# Patient Record
Sex: Female | Born: 1980
Health system: Southern US, Community
[De-identification: ages and names within clinical notes are randomized; demographics above are authoritative.]

## PROBLEM LIST (undated history)

## (undated) DIAGNOSIS — E669 Obesity, unspecified: Secondary | ICD-10-CM

## (undated) DIAGNOSIS — Z87442 Personal history of urinary calculi: Secondary | ICD-10-CM

## (undated) DIAGNOSIS — F410 Panic disorder [episodic paroxysmal anxiety] without agoraphobia: Secondary | ICD-10-CM

## (undated) DIAGNOSIS — K2970 Gastritis, unspecified, without bleeding: Secondary | ICD-10-CM

## (undated) DIAGNOSIS — E785 Hyperlipidemia, unspecified: Secondary | ICD-10-CM

## (undated) DIAGNOSIS — J45909 Unspecified asthma, uncomplicated: Secondary | ICD-10-CM

## (undated) DIAGNOSIS — G43909 Migraine, unspecified, not intractable, without status migrainosus: Secondary | ICD-10-CM

## (undated) DIAGNOSIS — M1990 Unspecified osteoarthritis, unspecified site: Secondary | ICD-10-CM

## (undated) DIAGNOSIS — O039 Complete or unspecified spontaneous abortion without complication: Secondary | ICD-10-CM

## (undated) DIAGNOSIS — F419 Anxiety disorder, unspecified: Secondary | ICD-10-CM

## (undated) DIAGNOSIS — T7840XA Allergy, unspecified, initial encounter: Secondary | ICD-10-CM

## (undated) DIAGNOSIS — E559 Vitamin D deficiency, unspecified: Secondary | ICD-10-CM

## (undated) DIAGNOSIS — R87619 Unspecified abnormal cytological findings in specimens from cervix uteri: Secondary | ICD-10-CM

## (undated) DIAGNOSIS — N200 Calculus of kidney: Secondary | ICD-10-CM

## (undated) HISTORY — DX: Panic disorder (episodic paroxysmal anxiety): F41.0

## (undated) HISTORY — DX: Obesity, unspecified: E66.9

## (undated) HISTORY — DX: Unspecified asthma, uncomplicated: J45.909

## (undated) HISTORY — DX: Unspecified abnormal cytological findings in specimens from cervix uteri: R87.619

## (undated) HISTORY — DX: Allergy, unspecified, initial encounter: T78.40XA

## (undated) HISTORY — DX: Hyperlipidemia, unspecified: E78.5

## (undated) HISTORY — DX: Anxiety disorder, unspecified: F41.9

## (undated) HISTORY — DX: Gastritis, unspecified, without bleeding: K29.70

## (undated) HISTORY — DX: Migraine, unspecified, not intractable, without status migrainosus: G43.909

## (undated) HISTORY — DX: Vitamin D deficiency, unspecified: E55.9

## (undated) HISTORY — DX: Complete or unspecified spontaneous abortion without complication: O03.9

---

## 1999-03-21 DIAGNOSIS — IMO0001 Reserved for inherently not codable concepts without codable children: Secondary | ICD-10-CM

## 1999-03-21 DIAGNOSIS — O039 Complete or unspecified spontaneous abortion without complication: Secondary | ICD-10-CM

## 1999-03-21 HISTORY — DX: Complete or unspecified spontaneous abortion without complication: O03.9

## 1999-03-21 HISTORY — DX: Reserved for inherently not codable concepts without codable children: IMO0001

## 2001-02-04 HISTORY — PX: BREAST BIOPSY: SHX20

## 2003-03-21 DIAGNOSIS — K2970 Gastritis, unspecified, without bleeding: Secondary | ICD-10-CM

## 2003-03-21 HISTORY — DX: Gastritis, unspecified, without bleeding: K29.70

## 2010-03-20 DIAGNOSIS — R87619 Unspecified abnormal cytological findings in specimens from cervix uteri: Secondary | ICD-10-CM

## 2010-03-20 HISTORY — DX: Unspecified abnormal cytological findings in specimens from cervix uteri: R87.619

## 2010-03-20 HISTORY — PX: COLPOSCOPY: SHX161

## 2012-04-08 DIAGNOSIS — Z9884 Bariatric surgery status: Secondary | ICD-10-CM | POA: Insufficient documentation

## 2012-04-08 HISTORY — PX: CHOLECYSTECTOMY: SHX55

## 2012-04-08 HISTORY — PX: OTHER SURGICAL HISTORY: SHX169

## 2013-05-09 DIAGNOSIS — Z9109 Other allergy status, other than to drugs and biological substances: Secondary | ICD-10-CM | POA: Insufficient documentation

## 2013-05-09 DIAGNOSIS — J302 Other seasonal allergic rhinitis: Secondary | ICD-10-CM | POA: Insufficient documentation

## 2013-06-26 LAB — HM PAP SMEAR: HM Pap smear: NORMAL

## 2014-04-13 ENCOUNTER — Telehealth: Payer: Self-pay

## 2014-04-13 NOTE — Telephone Encounter (Signed)
Can we request notes from Dr. Delana Meyer first?

## 2014-04-13 NOTE — Telephone Encounter (Signed)
Sure

## 2014-04-13 NOTE — Telephone Encounter (Signed)
The patient called and is hoping to be a new patient. She stated Dr.Schnier (spelling?) referred her to you.

## 2014-04-21 NOTE — Telephone Encounter (Signed)
Nevermind on this request - Lorriane Shire has scheduled this pt with Dr.Tullo.  (sorry for the confusion)

## 2014-04-28 ENCOUNTER — Encounter: Payer: Self-pay | Admitting: Internal Medicine

## 2014-04-28 ENCOUNTER — Ambulatory Visit (INDEPENDENT_AMBULATORY_CARE_PROVIDER_SITE_OTHER): Payer: 59 | Admitting: Internal Medicine

## 2014-04-28 VITALS — BP 130/82 | HR 75 | Resp 12 | Ht 66.0 in | Wt 231.1 lb

## 2014-04-28 DIAGNOSIS — E669 Obesity, unspecified: Secondary | ICD-10-CM

## 2014-04-28 DIAGNOSIS — Z0000 Encounter for general adult medical examination without abnormal findings: Secondary | ICD-10-CM

## 2014-04-28 DIAGNOSIS — E559 Vitamin D deficiency, unspecified: Secondary | ICD-10-CM

## 2014-04-28 DIAGNOSIS — Z8669 Personal history of other diseases of the nervous system and sense organs: Secondary | ICD-10-CM

## 2014-04-28 DIAGNOSIS — R739 Hyperglycemia, unspecified: Secondary | ICD-10-CM

## 2014-04-28 DIAGNOSIS — E785 Hyperlipidemia, unspecified: Secondary | ICD-10-CM

## 2014-04-28 DIAGNOSIS — F411 Generalized anxiety disorder: Secondary | ICD-10-CM | POA: Insufficient documentation

## 2014-04-28 MED ORDER — CLONAZEPAM 2 MG PO TABS: 2.0000 mg | ORAL_TABLET | Freq: Two times a day (BID) | ORAL | Status: DC | PRN
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
## 2014-04-28 NOTE — Assessment & Plan Note (Addendum)
Managed with once daily clonazepam and prn alprazolam,  Rare use.. Prior trials of SSRI, SNRIs  were not tolerated and ineffective despite dose titration.  Refills given.  The risks and benefits of benzodiazepine use were discussed with patient today including excessive sedation leading to respiratory depression,  impaired thinking/driving, and addiction.  Patient was advised to avoid concurrent use with alcohol, to use medication only as needed and not to share with others  .

## 2014-04-28 NOTE — Patient Instructions (Signed)
It was very nice meeting you! Return tomorrow for fasting labs  All of the foods can be found at grocery stores and in bulk at [employer].  The Atkins protein bars and shakes are available in more varieties at Target, [employer] and [employer].     7 AM Breakfast:  Choose from the following:  < 5 carbs  Weekdays: Low carbohydrate Protein  Shakes (EAS AdvantEdge "Carb  Control" shakes, Atkins,  Muscle Milk or Premier Protein shakes)     Weekends:  a scrambled egg/bacon/cheese burrito made with Mission's "carb  balance" whole wheat tortilla  (about 10 net carbs )  Eggs,  bacon /sausage , Joseph's pita /lavash bread or  (5 carbs)  A slice of fritatta ( egg based baked dish, no  crust:  google it) (< 10 carbs)   Avoid cereal and bananas, oatmeal and cream of wheat and grits. They are loaded with carbohydrates!   10 AM: high protein snack  (< 5 carbs)   Protein bar by Atkins  Or KIND  (the snack size, < 200 cal, usually < 6 carbs    A stick of cheese:  Around 1 carb,  100 cal      Other so called "protein bars" tend to be loaded with carbohydrates.  Remember, in food advertising, the word "energy" is synonymous for " carbohydrate."  Lunch:   A Sandwich using the bread choices listed, Can use any  Eggs,  lunchmeat, grilled meat or canned tuna).  Can add avocado, regular mayo/mustard  and cheese.  A Salad using blue cheese, ranch,  Goddess dressing  or vinagrette,  No croutons or "confetti" and no "candied nuts" but regular nuts OK.   2 HARD BOILED EGG WHITES AND A CUP OF one of these greek yogurts:    dannon lt n fit greek yogurt         chobani 100 greek yogurt,    Oikos triple zero greek yogurt       No pretzels or chips.  Pickles and miniature sweet peppers are a good low  carb alternative that provide a "crunch"  The bread is the only source of carbohydrate in a sandwich and  can be decreased by trying some of these alternatives to traditional loaf bread:   Joseph's pita bread and Lavash  (flat) bread :  50 cal and 4 net carbs  available at BJs and WalMart.  Taste better when toasted, use as pita chips  Toufayan makes a variety of  flatbreads and  A PITA POCKET.    LOOK FOR  THE ONES THAT ARE 17 NET CARBS OR LESS    Mission makes 2 sizes of  Low carb whole wheat tortillas  (The large one is  210 cal and 6 net carbs)   Avoid "Low fat dressings, as well as Barry Brunner and [place] dressings    3 PM/ Mid day  Snack:  Consider  1 ounce of  almonds, walnuts, pistachios, pecans, peanuts,  Macadamia nuts or a nut medley that does not contain raisins or cranberries.  No "granola"; the dried cranberries and raisins are loaded with carbohydrates. Mixed nuts as long as there are no raisins,  cranberries or dried fruit.    Try the prosciutto/mozzarella cheese sticks by F[employer]ruci  In deli /backery section   High protein   To avoid overindulging in snacks: Try drinking a glass of unsweeted almond/coconut milk  Or a cup of coffee with your Atkins chocolate bar to keep  you from having 3!!!   Pork rinds!  Yes Pork Rinds are low carb potato chip substitute!   Toasted Joseph's flatbread with hummous dip (chickpeas)       6 PM  Dinner:     Meat/fowl/fish with a green salad, and either broccoli, cauliflower, green beans, spinach, brussel sprouts, bok choy or  Lima beans. Fried in canola oil /olive oil BUT DO NOT BREAD THE PROTEIN!!      There is a low carb pasta by Dreamfield's that is acceptable and tastes great: only 5 digestible carbs/serving.( All grocery stores but BJs carry it )  Prepared Meals:  Try Hurley Cisco Angelo's chicken piccata or chicken or eggplant parm over low carb pasta.(Lowes and BJs)   Marjory Lies Sanchez's "Carnitas" (pulled pork, no sauce,  0 carbs) or his beef pot roast to make a dinner burrito (at [employer])  Barbecue with cole slaw is low carb BUT NO BUN!  SAME WITH HAMBURGERS     Whole wheat pasta is still full of digestible carbs and  Not as low in glycemic index as Dreamfield's.    Brown rice is still rice,  So skip the rice and noodles if you eat [place] or [place] (or at least limit to 1/2 cup)  9 PM snack :   Breyer's "low carb" fudgsicle or  ice cream bar (Carb Smart line), or  Weight  Watcher's ice cream bar , or another "no sugar added" ice cream;  a serving of fresh berries/cherries with whipped cream   Cheese or greek yogurt   8 ounces of [place] unsweetened almond/cococunut milk  Cheese and crackers (using WASA crackers,  They are low carb) or peanut butter on low carb crackers or pita bread     Avoid bananas, pineapple, grapes  and watermelon on a regular basis because they are high in sugar.  THINK OF THEM AS DESSERT and do not have daily   Remember that snack Substitutions should be less than 10 NET carbs per serving and meals should be < 20 net carbs. Remember that carbohydrates from fiber do not affect blood sugar, so you can  subtract fiber grams to get the "net carbs " of any particular food item.

## 2014-04-29 ENCOUNTER — Encounter: Payer: Self-pay | Admitting: Internal Medicine

## 2014-04-29 ENCOUNTER — Other Ambulatory Visit (INDEPENDENT_AMBULATORY_CARE_PROVIDER_SITE_OTHER): Payer: 59

## 2014-04-29 DIAGNOSIS — Z8669 Personal history of other diseases of the nervous system and sense organs: Secondary | ICD-10-CM | POA: Insufficient documentation

## 2014-04-29 DIAGNOSIS — R739 Hyperglycemia, unspecified: Secondary | ICD-10-CM

## 2014-04-29 DIAGNOSIS — E559 Vitamin D deficiency, unspecified: Secondary | ICD-10-CM

## 2014-04-29 DIAGNOSIS — Z0001 Encounter for general adult medical examination with abnormal findings: Secondary | ICD-10-CM | POA: Insufficient documentation

## 2014-04-29 DIAGNOSIS — Z0000 Encounter for general adult medical examination without abnormal findings: Secondary | ICD-10-CM | POA: Insufficient documentation

## 2014-04-29 DIAGNOSIS — E669 Obesity, unspecified: Secondary | ICD-10-CM | POA: Insufficient documentation

## 2014-04-29 DIAGNOSIS — E785 Hyperlipidemia, unspecified: Secondary | ICD-10-CM

## 2014-04-29 LAB — COMPREHENSIVE METABOLIC PANEL
ALT: 16 U/L (ref 0–35)
AST: 15 U/L (ref 0–37)
Albumin: 4 g/dL (ref 3.5–5.2)
Alkaline Phosphatase: 60 U/L (ref 39–117)
BUN: 11 mg/dL (ref 6–23)
CO2: 26 mEq/L (ref 19–32)
Calcium: 9.2 mg/dL (ref 8.4–10.5)
Chloride: 109 mEq/L (ref 96–112)
Creatinine, Ser: 0.59 mg/dL (ref 0.40–1.20)
GFR: 123.12 mL/min (ref >60.00)
Glucose, Bld: 87 mg/dL (ref 70–99)
Potassium: 4.3 mEq/L (ref 3.5–5.1)
Sodium: 142 mEq/L (ref 135–145)
Total Bilirubin: 0.6 mg/dL (ref 0.2–1.2)
Total Protein: 6.5 g/dL (ref 6.0–8.3)

## 2014-04-29 LAB — LIPID PANEL
Cholesterol: 217 mg/dL — ABNORMAL HIGH (ref 0–200)
HDL: 48.8 mg/dL (ref >39.00)
LDL Cholesterol: 139 mg/dL — ABNORMAL HIGH (ref 0–99)
NonHDL: 168.2
Total CHOL/HDL Ratio: 4
Triglycerides: 144 mg/dL (ref 0.0–149.0)
VLDL: 28.8 mg/dL (ref 0.0–40.0)

## 2014-04-29 LAB — TSH: TSH: 1.14 u[IU]/mL (ref 0.35–4.50)

## 2014-04-29 LAB — MAGNESIUM: Magnesium: 1.9 mg/dL (ref 1.5–2.5)

## 2014-04-29 LAB — HEMOGLOBIN A1C: Hgb A1c MFr Bld: 5.4 % (ref 4.6–6.5)

## 2014-04-29 LAB — VITAMIN D 25 HYDROXY (VIT D DEFICIENCY, FRACTURES): VITD: 21.62 ng/mL — ABNORMAL LOW (ref 30.00–100.00)

## 2014-04-29 MED ORDER — CLINDAMYCIN PHOS-BENZOYL PEROX 1.2-5 % EX GEL: 1.0000 "application " | Freq: Every day | CUTANEOUS | Status: DC

## 2014-04-29 NOTE — Assessment & Plan Note (Signed)
Infrequent,  Stress related,  Managed with imitrex,  Daily Toprol

## 2014-04-29 NOTE — Assessment & Plan Note (Addendum)
S/p gastric sleeve reduction Jan 2014 done in [place].  70 wt loss thus far.  Has plateaued.   I have addressed  BMI and recommended resuming a low glycemic index diet utilizing smaller more frequent meals to increase metabolism.  I have also recommended that patient resume a regular exercise program with  a goal of 30 minutes of aerobic exercise a minimum of 5 days per week. Screening for lipid disorders, thyroid and diabetes to be done today.  Body mass index is 37.32 kg/(m^2).

## 2014-04-29 NOTE — Assessment & Plan Note (Signed)

## 2014-05-01 ENCOUNTER — Other Ambulatory Visit: Payer: Self-pay | Admitting: Internal Medicine

## 2014-05-01 ENCOUNTER — Encounter: Payer: Self-pay | Admitting: Internal Medicine

## 2014-05-01 DIAGNOSIS — E559 Vitamin D deficiency, unspecified: Secondary | ICD-10-CM | POA: Insufficient documentation

## 2014-05-01 MED ORDER — VITAMIN D (ERGOCALCIFEROL) 1.25 MG (50000 UNIT) PO CAPS
50000.0000 [IU] | ORAL_CAPSULE | ORAL | Status: DC
Start: 2014-05-01 — End: 2015-07-14

## 2014-05-17 ENCOUNTER — Encounter: Payer: Self-pay | Admitting: Internal Medicine

## 2014-05-18 MED ORDER — DESLORATADINE 5 MG PO TABS: 5.0000 mg | ORAL_TABLET | Freq: Every day | ORAL | Status: DC

## 2014-05-18 MED ORDER — AZELASTINE-FLUTICASONE 137-50 MCG/ACT NA SUSP: 1.0000 | Freq: Two times a day (BID) | NASAL | Status: DC | PRN

## 2014-05-31 ENCOUNTER — Encounter: Payer: Self-pay | Admitting: Internal Medicine

## 2014-06-01 MED ORDER — MONTELUKAST SODIUM 10 MG PO TABS: 10.0000 mg | ORAL_TABLET | Freq: Every day | ORAL | Status: DC

## 2014-06-01 MED ORDER — METOPROLOL SUCCINATE ER 100 MG PO TB24: 100.0000 mg | ORAL_TABLET | Freq: Every day | ORAL | Status: DC

## 2014-06-01 NOTE — Telephone Encounter (Signed)
Pt requesting 90 day supplies, see mychart msg. Metoprolol and Montekulast already sent.

## 2014-06-02 MED ORDER — LIDOCAINE-PRILOCAINE 2.5-2.5 % EX CREA
1.0000 | TOPICAL_CREAM | CUTANEOUS | Status: DC | PRN
Start: 2014-06-02 — End: 2016-03-02

## 2014-06-02 MED ORDER — TRAMADOL HCL 50 MG PO TABS: 100.0000 mg | ORAL_TABLET | ORAL | Status: DC | PRN

## 2014-06-02 NOTE — Telephone Encounter (Signed)
Ok to refill,  printed rx  

## 2014-06-02 NOTE — Telephone Encounter (Signed)
Faxed to pharmacy

## 2014-06-03 LAB — HM PAP SMEAR: HM Pap smear: NEGATIVE

## 2014-06-14 ENCOUNTER — Encounter: Payer: Self-pay | Admitting: Internal Medicine

## 2014-06-14 DIAGNOSIS — Z1239 Encounter for other screening for malignant neoplasm of breast: Secondary | ICD-10-CM

## 2014-06-15 ENCOUNTER — Other Ambulatory Visit: Payer: Self-pay | Admitting: *Deleted

## 2014-06-15 MED ORDER — PANTOPRAZOLE SODIUM 40 MG PO TBEC: 40.0000 mg | DELAYED_RELEASE_TABLET | Freq: Every day | ORAL | Status: DC

## 2014-06-16 NOTE — Telephone Encounter (Signed)
We will order one for you and Bonnita Nasuti our office mgr will call you wit the appt

## 2014-06-22 ENCOUNTER — Telehealth: Payer: Self-pay

## 2014-06-22 NOTE — Telephone Encounter (Signed)
The patient called and is hoping to have her mammogram scheduled.

## 2014-07-21 ENCOUNTER — Encounter: Payer: Self-pay | Admitting: Internal Medicine

## 2014-07-22 MED ORDER — ALPRAZOLAM 2 MG PO TABS: 2.0000 mg | ORAL_TABLET | Freq: Every day | ORAL | Status: DC | PRN

## 2014-07-22 NOTE — Telephone Encounter (Signed)
Pt sent mychart msg, requesting refill

## 2014-07-22 NOTE — Telephone Encounter (Signed)
Ok to refill,  printed rx  

## 2014-07-22 NOTE — Telephone Encounter (Signed)
Faxed to pharmacy

## 2014-08-10 ENCOUNTER — Encounter: Payer: Self-pay | Admitting: Internal Medicine

## 2014-08-10 MED ORDER — ALBUTEROL SULFATE HFA 108 (90 BASE) MCG/ACT IN AERS: 2.0000 | INHALATION_SPRAY | RESPIRATORY_TRACT | Status: DC | PRN

## 2014-08-11 ENCOUNTER — Ambulatory Visit
Admission: RE | Admit: 2014-08-11 | Discharge: 2014-08-11 | Disposition: A | Discharge status: Home / Self Care | Payer: 59 | Source: Ambulatory Visit | Attending: Internal Medicine | Admitting: Internal Medicine

## 2014-08-11 DIAGNOSIS — Z1239 Encounter for other screening for malignant neoplasm of breast: Secondary | ICD-10-CM

## 2014-08-24 ENCOUNTER — Other Ambulatory Visit: Payer: Self-pay | Admitting: Internal Medicine

## 2014-08-24 DIAGNOSIS — Z1231 Encounter for screening mammogram for malignant neoplasm of breast: Secondary | ICD-10-CM

## 2014-09-01 ENCOUNTER — Ambulatory Visit: Payer: Self-pay

## 2014-09-08 ENCOUNTER — Ambulatory Visit
Admission: RE | Admit: 2014-09-08 | Discharge: 2014-09-08 | Disposition: A | Discharge status: Home / Self Care | Payer: 59 | Source: Ambulatory Visit | Attending: Internal Medicine | Admitting: Internal Medicine

## 2014-09-08 ENCOUNTER — Ambulatory Visit: Payer: 59

## 2014-09-08 DIAGNOSIS — Z1231 Encounter for screening mammogram for malignant neoplasm of breast: Secondary | ICD-10-CM | POA: Insufficient documentation

## 2014-09-09 ENCOUNTER — Encounter: Payer: Self-pay | Admitting: Internal Medicine

## 2014-11-10 ENCOUNTER — Other Ambulatory Visit: Payer: Self-pay | Admitting: Internal Medicine

## 2014-11-10 NOTE — Telephone Encounter (Signed)
Last OV 2.9.16.  Please advise refill

## 2014-11-11 NOTE — Telephone Encounter (Signed)
Refill for 30 days only.  OFFICE VISIT NEEDED prior to any more refills 

## 2014-11-12 NOTE — Telephone Encounter (Signed)
Rx faxed

## 2014-11-16 ENCOUNTER — Other Ambulatory Visit: Payer: Self-pay | Admitting: Internal Medicine

## 2014-12-07 ENCOUNTER — Other Ambulatory Visit: Payer: Self-pay | Admitting: Internal Medicine

## 2014-12-20 ENCOUNTER — Other Ambulatory Visit: Payer: Self-pay | Admitting: Internal Medicine

## 2014-12-21 ENCOUNTER — Other Ambulatory Visit: Payer: Self-pay | Admitting: Internal Medicine

## 2014-12-21 NOTE — Telephone Encounter (Signed)
I received a refill request for Clonazepam. Last refilled 11/11/14 for #60 with 0 refills. Last office visit was 04/28/14. Okay to refill this medication?

## 2014-12-21 NOTE — Telephone Encounter (Signed)
NO REFILLS. PATIENT NEEDS TO BE SEEN A MINIMUM OF EVERY 6 MONTHS . THIS WAS PRINTED ON HER LAST REFILL

## 2014-12-22 ENCOUNTER — Encounter: Payer: Self-pay | Admitting: Internal Medicine

## 2014-12-22 ENCOUNTER — Telehealth: Payer: Self-pay | Admitting: *Deleted

## 2014-12-22 ENCOUNTER — Other Ambulatory Visit: Payer: Self-pay

## 2014-12-22 MED ORDER — CLONAZEPAM 2 MG PO TABS: ORAL_TABLET | ORAL | Status: DC

## 2014-12-22 NOTE — Telephone Encounter (Signed)
Made an appointment for 10/17 and refilled for one month.

## 2014-12-22 NOTE — Telephone Encounter (Signed)
Patient has request a medication refill clonazepam, however she was told to make an appt. 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 Please advise where to place patient on the schedule.

## 2014-12-24 NOTE — Telephone Encounter (Signed)
This refill request has been handled

## 2014-12-28 ENCOUNTER — Ambulatory Visit: Payer: Self-pay | Admitting: Internal Medicine

## 2015-01-04 ENCOUNTER — Ambulatory Visit (INDEPENDENT_AMBULATORY_CARE_PROVIDER_SITE_OTHER): Payer: 59 | Admitting: Internal Medicine

## 2015-01-04 ENCOUNTER — Encounter: Payer: Self-pay | Admitting: Internal Medicine

## 2015-01-04 VITALS — BP 124/74 | HR 84 | Temp 98.2°F | Wt 236.0 lb

## 2015-01-04 DIAGNOSIS — E785 Hyperlipidemia, unspecified: Secondary | ICD-10-CM

## 2015-01-04 DIAGNOSIS — E669 Obesity, unspecified: Secondary | ICD-10-CM

## 2015-01-04 DIAGNOSIS — E559 Vitamin D deficiency, unspecified: Secondary | ICD-10-CM

## 2015-01-04 DIAGNOSIS — Z113 Encounter for screening for infections with a predominantly sexual mode of transmission: Secondary | ICD-10-CM | POA: Diagnosis not present

## 2015-01-04 DIAGNOSIS — F411 Generalized anxiety disorder: Secondary | ICD-10-CM | POA: Diagnosis not present

## 2015-01-04 MED ORDER — FAMOTIDINE 40 MG PO TABS: 40.0000 mg | ORAL_TABLET | Freq: Two times a day (BID) | ORAL | Status: DC

## 2015-01-04 MED ORDER — CLONAZEPAM 2 MG PO TABS: ORAL_TABLET | ORAL | Status: DC

## 2015-01-04 NOTE — Patient Instructions (Addendum)
  I appreciate your concern about continuing your PPI in light of the recently published studies suggesting an association with increased risk of dementia and kidney failure.  I advise you to try switching  From your PPI to either famotidine 20 mg once or twice daily,  or to  ranitidine 150 mg once or twice daily.  These medications are  H2 blockers and are available without a prescriptions.   if your reflux symptoms are controlled,  You can Continue the daily h2 blocker.   The  diet I discussed with you today is the 10 day Green Smoothie Cleansing /Detox Diet by Linden Dolin . available on [place] for around $10.  This is not a low carb or a weight loss diet,  It is fundamentally a "cleansing" low fat diet that eliminates sugar, gluten, caffeine, alcohol and dairy for 10 days .  What you add back after the initial ten days is entirely up to  you!  You can expect to lose 5 to 10 lbs depending on how strict you are.   I found that  drinking 2 smoothies or juices  daily and keeping one chewable meal (but keep it simple, like baked fish and salad, rice or bok choy) kept me satisfied and kept me from straying  .  You snack primarily on fresh  fruit, egg whites and judicious quantities of nuts. I  Recommend adding a  vegetable based protein powder  To any smoothie made with almond milk.  (nothing with whey , since whey is dairy) in it.  [employer] has a Research officer, political party .   It does require some form of a nutrient extractor (Vita Mix, a electric juicer,  Or a Nutribullet Rx).  i have found that using frozen fruits is much more convenient and cost effective. You can even find plenty of organic fruit in the frozen fruit section of BJS's.  Just thaw what you need for the following day the night before in the refrigerator (to avoid jamming up your machine)    The organic greens drink I use if I don't have any fresh greens  is called "Suja" and it's sold in the vegetable refrigerated section of most grocery stores  (including BJ's)  . It is tart, though, so be careful (has lemon juice in it )  The organic vegan protein powder I tried  is called Vega" and I found it at [employer] .  It is sugar free   You are welcome to join my group at [employer].  We work out with Betsey Holiday on Mon , [place] at 5:00 am and most Saturdays at 7:30 am   Her contact information is [phone]
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
## 2015-01-05 NOTE — Assessment & Plan Note (Addendum)
Managed with clonazepam and prn alprazolam. Review of prior trials of SSRI, SNRIs  Was discussed with patient today ; they were not tolerated, including generic buspirone, despite dose titration.  Refills given on clonazepam for #60/month.  The risks and benefits of benzodiazepine use were discussed with patient today including excessive sedation leading to respiratory depression,  impaired thinking/driving, and addiction.  Patient was advised to avoid concurrent use with alcohol, to use medication only as needed and not to share with others  .

## 2015-01-05 NOTE — Assessment & Plan Note (Signed)
S/p gastric sleeve reduction Jan 2014 done in [place] prior to relocation to   70 lb weight loss has now reached a nadir and she has gained 5 lbs since her last visit 8 months ago.   I have addressed  BMI and recommended resuming a low glycemic index diet utilizing smaller more frequent meals to increase metabolism.  I have also recommended that patient resume a regular exercise program with  a goal of 30 minutes of aerobic exercise a minimum of 5 days per week. Screening for lipid disorders, thyroid and diabetes to be done today.  Body mass index is 38.11 kg/(m^2).  Wt Readings from Last 3 Encounters:  01/04/15 236 lb (107.049 kg)  04/29/14 231 lb 1.9 oz (104.835 kg)

## 2015-01-10 ENCOUNTER — Encounter: Payer: Self-pay | Admitting: Internal Medicine

## 2015-01-11 ENCOUNTER — Other Ambulatory Visit: Payer: Self-pay

## 2015-01-11 MED ORDER — SUMATRIPTAN SUCCINATE 100 MG PO TABS: 100.0000 mg | ORAL_TABLET | Freq: Every day | ORAL | Status: DC | PRN

## 2015-01-11 MED ORDER — ALPRAZOLAM 2 MG PO TABS: 2.0000 mg | ORAL_TABLET | Freq: Every day | ORAL | Status: DC | PRN

## 2015-01-11 NOTE — Telephone Encounter (Signed)
Ok to refill,  printed rx  

## 2015-01-11 NOTE — Telephone Encounter (Signed)
Please advise refill? 

## 2015-02-17 ENCOUNTER — Other Ambulatory Visit: Payer: Self-pay | Admitting: Internal Medicine

## 2015-02-17 NOTE — Telephone Encounter (Signed)
Ok to refill,  printed rx  

## 2015-02-17 NOTE — Telephone Encounter (Signed)
Rx faxed

## 2015-05-11 ENCOUNTER — Encounter: Payer: Self-pay | Admitting: Internal Medicine

## 2015-05-12 ENCOUNTER — Other Ambulatory Visit: Payer: Self-pay

## 2015-05-12 DIAGNOSIS — Z760 Encounter for issue of repeat prescription: Secondary | ICD-10-CM

## 2015-05-12 MED ORDER — METOPROLOL SUCCINATE ER 100 MG PO TB24: 100.0000 mg | ORAL_TABLET | Freq: Every day | ORAL | Status: DC

## 2015-05-25 ENCOUNTER — Encounter: Payer: Self-pay | Admitting: Internal Medicine

## 2015-05-26 ENCOUNTER — Other Ambulatory Visit: Payer: Self-pay | Admitting: Internal Medicine

## 2015-05-26 NOTE — Telephone Encounter (Signed)
Refilled 04/2014.Last seen in October 2016. Please advise refill?

## 2015-05-27 NOTE — Telephone Encounter (Signed)
Refilled

## 2015-06-07 ENCOUNTER — Encounter: Payer: Self-pay | Admitting: Internal Medicine

## 2015-06-08 ENCOUNTER — Other Ambulatory Visit: Payer: Self-pay | Admitting: Internal Medicine

## 2015-07-12 ENCOUNTER — Ambulatory Visit (INDEPENDENT_AMBULATORY_CARE_PROVIDER_SITE_OTHER): Payer: 59 | Admitting: Internal Medicine

## 2015-07-12 VITALS — BP 114/68 | HR 72 | Temp 98.2°F | Resp 12 | Ht 66.0 in | Wt 236.5 lb

## 2015-07-12 DIAGNOSIS — E785 Hyperlipidemia, unspecified: Secondary | ICD-10-CM | POA: Diagnosis not present

## 2015-07-12 DIAGNOSIS — Z0000 Encounter for general adult medical examination without abnormal findings: Secondary | ICD-10-CM

## 2015-07-12 DIAGNOSIS — H5202 Hypermetropia, left eye: Secondary | ICD-10-CM | POA: Diagnosis not present

## 2015-07-12 DIAGNOSIS — Z308 Encounter for other contraceptive management: Secondary | ICD-10-CM | POA: Diagnosis not present

## 2015-07-12 DIAGNOSIS — Z3046 Encounter for surveillance of implantable subdermal contraceptive: Secondary | ICD-10-CM | POA: Diagnosis not present

## 2015-07-12 DIAGNOSIS — F411 Generalized anxiety disorder: Secondary | ICD-10-CM

## 2015-07-12 DIAGNOSIS — H5211 Myopia, right eye: Secondary | ICD-10-CM | POA: Diagnosis not present

## 2015-07-12 DIAGNOSIS — Z113 Encounter for screening for infections with a predominantly sexual mode of transmission: Secondary | ICD-10-CM | POA: Diagnosis not present

## 2015-07-12 DIAGNOSIS — E669 Obesity, unspecified: Secondary | ICD-10-CM

## 2015-07-12 DIAGNOSIS — Z309 Encounter for contraceptive management, unspecified: Secondary | ICD-10-CM | POA: Diagnosis not present

## 2015-07-12 DIAGNOSIS — E559 Vitamin D deficiency, unspecified: Secondary | ICD-10-CM | POA: Diagnosis not present

## 2015-07-12 DIAGNOSIS — Z01419 Encounter for gynecological examination (general) (routine) without abnormal findings: Secondary | ICD-10-CM | POA: Diagnosis not present

## 2015-07-12 LAB — COMPREHENSIVE METABOLIC PANEL
ALT: 17 U/L (ref 0–35)
AST: 17 U/L (ref 0–37)
Albumin: 3.9 g/dL (ref 3.5–5.2)
Alkaline Phosphatase: 65 U/L (ref 39–117)
BUN: 11 mg/dL (ref 6–23)
CO2: 27 mEq/L (ref 19–32)
Calcium: 9 mg/dL (ref 8.4–10.5)
Chloride: 104 mEq/L (ref 96–112)
Creatinine, Ser: 0.66 mg/dL (ref 0.40–1.20)
GFR: 107.45 mL/min (ref >60.00)
Glucose, Bld: 81 mg/dL (ref 70–99)
Potassium: 4.1 mEq/L (ref 3.5–5.1)
Sodium: 140 mEq/L (ref 135–145)
Total Bilirubin: 0.4 mg/dL (ref 0.2–1.2)
Total Protein: 6.6 g/dL (ref 6.0–8.3)

## 2015-07-12 LAB — LIPID PANEL
Cholesterol: 209 mg/dL — ABNORMAL HIGH (ref 0–200)
HDL: 52.1 mg/dL (ref >39.00)
LDL Cholesterol: 132 mg/dL — ABNORMAL HIGH (ref 0–99)
NonHDL: 156.67
Total CHOL/HDL Ratio: 4
Triglycerides: 121 mg/dL (ref 0.0–149.0)
VLDL: 24.2 mg/dL (ref 0.0–40.0)

## 2015-07-12 LAB — TSH: TSH: 2.56 u[IU]/mL (ref 0.35–4.50)

## 2015-07-12 LAB — VITAMIN D 25 HYDROXY (VIT D DEFICIENCY, FRACTURES): VITD: 18.99 ng/mL — ABNORMAL LOW (ref 30.00–100.00)

## 2015-07-12 MED ORDER — CLONAZEPAM 2 MG PO TABS: ORAL_TABLET | ORAL | Status: DC

## 2015-07-12 MED ORDER — ALPRAZOLAM 2 MG PO TABS: 2.0000 mg | ORAL_TABLET | Freq: Every day | ORAL | Status: DC | PRN

## 2015-07-12 NOTE — Progress Notes (Signed)
Patient ID: Tammy Christian, female    DOB: July 15, 1980  Age: 35 y.o. MRN: [id number]  The patient is here for annual non gyn wellness examination and management of other chronic and acute problems.   She is scheduled for breast and pelvic exam by her gynecologist today  [place]  daily  Of Vit D3   Obesity:  She has had no weight change.  She is not  exercising   The risk factors are reflected in the social history.  The roster of all physicians providing medical care to patient - is listed in the Snapshot section of the chart.  Activities of daily living:  The patient is 100% independent in all ADLs: dressing, toileting, feeding as well as independent mobility  Home safety : The patient has smoke detectors in the home. They wear seatbelts.  There are no firearms at home. There is no violence in the home.   There is no risks for hepatitis, STDs or HIV. There is no   history of blood transfusion. They have no travel history to infectious disease endemic areas of the world.  The patient has seen their dentist in the last six month. They have seen their eye doctor in the last year. They admit to slight hearing difficulty with regard to whispered voices and some television programs.  They have deferred audiologic testing in the last year.  They do not  have excessive sun exposure. Discussed the need for sun protection: hats, long sleeves and use of sunscreen if there is significant sun exposure.   Diet: the importance of a healthy diet is discussed. They do have a healthy diet.  The benefits of regular aerobic exercise were discussed. She does not exercise regularly.   Depression screen: there are no signs or vegative symptoms of depression- irritability, change in appetite, anhedonia, sadness/tearfullness.  Cognitive assessment: the patient manages all their financial and personal affairs and is actively engaged. They could relate day,date,year and events; recalled 2/3 objects at 3  minutes; performed clock-face test normally.  The following portions of the patient's history were reviewed and updated as appropriate: allergies, current medications, past family history, past medical history,  past surgical history, past social history  and problem list.  Visual acuity was not assessed per patient preference since she has regular follow up with her ophthalmologist. Hearing and body mass index were assessed and reviewed.   During the course of the visit the patient was educated and counseled about appropriate screening and preventive services including : fall prevention , diabetes screening, nutrition counseling, colorectal cancer screening, and recommended immunizations.    CC: The primary encounter diagnosis was Generalized anxiety disorder. Diagnoses of Hyperlipidemia, Vitamin D deficiency, Screen for STD (sexually transmitted disease), Obesity, and Routine general medical examination at a health care facility were also pertinent to this visit.  History Tammy Christian has a past medical history of Allergy; Asthma; Gastritis (2005); Anxiety; Migraines; Hyperlipidemia; Vitamin D deficiency disease; and Obesity.   She has past surgical history that includes Cholecystectomy (04/08/2012); laprascopic sleeve; and Breast biopsy (Right, 02/04/2001).   Her family history includes Cancer in her maternal grandmother and paternal grandfather; Cancer (age of onset: 63) in her father; Diabetes in her father; Heart disease in her father and paternal grandmother; Hyperlipidemia in her father; Hypertension in her father.She reports that she has never smoked. She does not have any smokeless tobacco history on file. She reports that she drinks about 1.2 oz of alcohol per week. Her drug history is  not on file.  Outpatient Prescriptions Prior to Visit  Medication Sig Dispense Refill  . albuterol (PROVENTIL HFA;VENTOLIN HFA) 108 (90 BASE) MCG/ACT inhaler Inhale 2 puffs into the lungs every 4 (four) hours  as needed. 3 Inhaler 1  . Azelastine-Fluticasone 137-50 MCG/ACT SUSP Place 1 spray into the nose 2 (two) times daily as needed. 3 Bottle 3  . clindamycin-benzoyl peroxide (BENZACLIN) gel APPLY 1 APPLICATION TOPICALLY DAILY. 50 g 4  . etonogestrel (NEXPLANON) 68 MG IMPL implant 1 each by Subdermal route once.    . famotidine (PEPCID) 40 MG tablet Take 1 tablet (40 mg total) by mouth 2 (two) times daily. 60 tablet 5  . lidocaine-prilocaine (EMLA) cream Apply 1 application topically as needed. 30 g 3  . metoprolol succinate (TOPROL-XL) 100 MG 24 hr tablet Take 1 tablet (100 mg total) by mouth daily. Take with or immediately following a meal. 90 tablet 0  . pantoprazole (PROTONIX) 40 MG tablet TAKE 1 TABLET (40 MG TOTAL) BY MOUTH DAILY. 90 tablet 2  . SUMAtriptan (IMITREX) 100 MG tablet Take 1 tablet (100 mg total) by mouth daily as needed. 10 tablet 5  . traMADol (ULTRAM) 50 MG tablet TAKE 2 TABLETS BY MOUTH EVERY 4 HOURS AS NEEDED 90 tablet 3  . alprazolam (XANAX) 2 MG tablet Take 1 tablet (2 mg total) by mouth daily as needed for sleep. 30 tablet 3  . clonazePAM (KLONOPIN) 2 MG tablet TAKE 1 TABLET BY MOUTH 2 TIMES DAILY AS NEEDED 60 tablet 5  . desloratadine (CLARINEX) 5 MG tablet Take 1 tablet (5 mg total) by mouth daily. 90 tablet 3  . montelukast (SINGULAIR) 10 MG tablet TAKE 1 TABLET BY MOUTH AT BEDTIME. 90 tablet 1  . Vitamin D, Ergocalciferol, (DRISDOL) 50000 UNITS CAPS capsule Take 1 capsule (50,000 Units total) by mouth every 7 (seven) days. 12 capsule 0   No facility-administered medications prior to visit.    Review of Systems   Patient denies headache, fevers, malaise, unintentional weight loss, skin rash, eye pain, sinus congestion and sinus pain, sore throat, dysphagia,  hemoptysis , cough, dyspnea, wheezing, chest pain, palpitations, orthopnea, edema, abdominal pain, nausea, melena, diarrhea, constipation, flank pain, dysuria, hematuria, urinary  Frequency, nocturia, numbness,  tingling, seizures,  Focal weakness, Loss of consciousness,  Tremor, insomnia, depression, anxiety, and suicidal ideation.      Objective:  BP 114/68 mmHg  Pulse 72  Temp(Src) 98.2 F (36.8 C) (Oral)  Resp 12  Ht 5\' 6"  (1.676 m)  Wt 236 lb 8 oz (107.276 kg)  BMI 38.19 kg/m2  SpO2 98%  Physical Exam   General appearance: obese, alert, cooperative and appears stated age Ears: normal TM's and external ear canals both ears Throat: lips, mucosa, and tongue normal; teeth and gums normal Neck: no adenopathy, no carotid bruit, supple, symmetrical, trachea midline and thyroid not enlarged, symmetric, no tenderness/mass/nodules Back: symmetric, no curvature. ROM normal. No CVA tenderness. Lungs: clear to auscultation bilaterally Heart: regular rate and rhythm, S1, S2 normal, no murmur, click, rub or gallop Abdomen: soft, non-tender; bowel sounds normal; no masses,  no organomegaly Pulses: 2+ and symmetric Skin: Skin color, texture, turgor normal. No rashes or lesions Lymph nodes: Cervical, supraclavicular, and axillary nodes normal. Psych: affect normal, makes good eye contact. No fidgeting,  Smiles easily.  Denies suicidal thoughts     Assessment & Plan:   Problem List Items Addressed This Visit    Generalized anxiety disorder - Primary    Managed with clonazepam  and prn alprazolam. Review of prior trials of SSRI, SNRIs  Was discussed with patient today ; they were not tolerated, including generic buspirone, despite dose titration.  Refills given on clonazepam for #60/month.  The risks and benefits of benzodiazepine use were discussed with patient today including excessive sedation leading to respiratory depression,  impaired thinking/driving, and withdrawal seizures/addiction.  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 Patient was advised to avoid concurrent use with alcohol, to use medication only as needed and not to share with others  .           Obesity    I have  addressed  BMI and recommended a low glycemic index diet utilizing smaller more frequent meals to increase metabolism.  I have also recommended that patient start exercising with a goal of 30 minutes of aerobic exercise a minimum of 5 days per week. Screening for lipid disorders, thyroid and diabetes to be done today.  Lab Results  Component Value Date   TSH 2.56 07/12/2015   Lab Results  Component Value Date   CHOL 209* 07/12/2015   HDL 52.10 07/12/2015   LDLCALC 132* 07/12/2015   TRIG 121.0 07/12/2015   CHOLHDL 4 07/12/2015   Lab Results  Component Value Date   HGBA1C 5.4 04/29/2014         Routine general medical examination at a health care facility    Annual comprehensive preventive exam was done as well as an evaluation and management of chronic conditions .  During the course of the visit the patient was educated and counseled about appropriate screening and preventive services including :  diabetes screening, lipid analysis with projected  10 year  risk for CAD , nutrition counseling, breast, cervical and colorectal cancer screening, and recommended immunizations.  Printed recommendations for health maintenance screenings was given.  Lab Results  Component Value Date   CHOL 209* 07/12/2015   HDL 52.10 07/12/2015   LDLCALC 132* 07/12/2015   TRIG 121.0 07/12/2015   CHOLHDL 4 07/12/2015         Vitamin D deficiency    Recurrent. May be due to gastric sleeve versus lack of sunshine. Refill Drisdol       Other Visit Diagnoses    Hyperlipidemia        Screen for STD (sexually transmitted disease)           I am having Ms. Paget maintain her etonogestrel, Azelastine-Fluticasone, lidocaine-prilocaine, albuterol, famotidine, SUMAtriptan, traMADol, metoprolol succinate, clindamycin-benzoyl peroxide, pantoprazole, clonazePAM, alprazolam, and Vitamin D (Ergocalciferol).  Meds ordered this encounter  Medications  . clonazePAM (KLONOPIN) 2 MG tablet    Sig: TAKE 1  TABLET BY MOUTH 2 TIMES DAILY AS NEEDED    Dispense:  60 tablet    Refill:  5    May refill on or after Jul 22 2015  . alprazolam (XANAX) 2 MG tablet    Sig: Take 1 tablet (2 mg total) by mouth daily as needed for sleep.    Dispense:  30 tablet    Refill:  3  . Vitamin D, Ergocalciferol, (DRISDOL) 50000 units CAPS capsule    Sig: Take 1 capsule (50,000 Units total) by mouth every 7 (seven) days.    Dispense:  12 capsule    Refill:  3    Medications Discontinued During This Encounter  Medication Reason  . clonazePAM (KLONOPIN) 2 MG tablet Reorder  . alprazolam (XANAX) 2 MG tablet Reorder  . Vitamin D, Ergocalciferol, (DRISDOL) 50000  UNITS CAPS capsule Reorder    Follow-up: No Follow-up on file.   Crecencio Mc, MD

## 2015-07-12 NOTE — Progress Notes (Signed)
Pre-visit discussion using our clinic review tool. No additional management support is needed unless otherwise documented below in the visit note.  

## 2015-07-13 ENCOUNTER — Encounter: Payer: Self-pay | Admitting: Internal Medicine

## 2015-07-13 LAB — HIV ANTIBODY (ROUTINE TESTING W REFLEX): HIV 1&2 Ab, 4th Generation: NONREACTIVE

## 2015-07-13 LAB — HEPATITIS C ANTIBODY: HCV Ab: NEGATIVE

## 2015-07-14 ENCOUNTER — Encounter: Payer: Self-pay | Admitting: Internal Medicine

## 2015-07-14 MED ORDER — DESLORATADINE 5 MG PO TABS: 5.0000 mg | ORAL_TABLET | Freq: Every day | ORAL | Status: DC

## 2015-07-14 MED ORDER — MONTELUKAST SODIUM 10 MG PO TABS: 10.0000 mg | ORAL_TABLET | Freq: Every day | ORAL | Status: DC

## 2015-07-14 MED ORDER — VITAMIN D (ERGOCALCIFEROL) 1.25 MG (50000 UNIT) PO CAPS: 50000.0000 [IU] | ORAL_CAPSULE | ORAL | Status: DC

## 2015-07-14 NOTE — Assessment & Plan Note (Signed)
Annual comprehensive preventive exam was done as well as an evaluation and management of chronic conditions .  During the course of the visit the patient was educated and counseled about appropriate screening and preventive services including :  diabetes screening, lipid analysis with projected  10 year  risk for CAD , nutrition counseling, breast, cervical and colorectal cancer screening, and recommended immunizations.  Printed recommendations for health maintenance screenings was given.  Lab Results  Component Value Date   CHOL 209* 07/12/2015   HDL 52.10 07/12/2015   LDLCALC 132* 07/12/2015   TRIG 121.0 07/12/2015   CHOLHDL 4 07/12/2015

## 2015-07-14 NOTE — Assessment & Plan Note (Signed)
Managed with clonazepam and prn alprazolam. Review of prior trials of SSRI, SNRIs  Was discussed with patient today ; they were not tolerated, including generic buspirone, despite dose titration.  Refills given on clonazepam for #60/month.  The risks and benefits of benzodiazepine use were discussed with patient today including excessive sedation leading to respiratory depression,  impaired thinking/driving, and withdrawal seizures/addiction.  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 Patient was advised to avoid concurrent use with alcohol, to use medication only as needed and not to share with others  .

## 2015-07-14 NOTE — Assessment & Plan Note (Signed)
I have addressed  BMI and recommended a low glycemic index diet utilizing smaller more frequent meals to increase metabolism.  I have also recommended that patient start exercising with a goal of 30 minutes of aerobic exercise a minimum of 5 days per week. Screening for lipid disorders, thyroid and diabetes to be done today.  Lab Results  Component Value Date   TSH 2.56 07/12/2015   Lab Results  Component Value Date   CHOL 209* 07/12/2015   HDL 52.10 07/12/2015   LDLCALC 132* 07/12/2015   TRIG 121.0 07/12/2015   CHOLHDL 4 07/12/2015   Lab Results  Component Value Date   HGBA1C 5.4 04/29/2014

## 2015-07-14 NOTE — Assessment & Plan Note (Signed)
Recurrent. May be due to gastric sleeve versus lack of sunshine. Refill Drisdol

## 2015-07-20 ENCOUNTER — Encounter: Payer: Self-pay | Admitting: Internal Medicine

## 2015-07-21 ENCOUNTER — Other Ambulatory Visit: Payer: Self-pay | Admitting: Internal Medicine

## 2015-07-21 MED ORDER — SUMATRIPTAN SUCCINATE 100 MG PO TABS: 100.0000 mg | ORAL_TABLET | Freq: Every day | ORAL | Status: DC | PRN

## 2015-07-21 NOTE — Telephone Encounter (Signed)
Refill request

## 2015-09-01 ENCOUNTER — Emergency Department
Admission: EM | Admit: 2015-09-01 | Discharge: 2015-09-01 | Disposition: A | Discharge status: Home / Self Care | Payer: 59 | Attending: Emergency Medicine | Admitting: Emergency Medicine

## 2015-09-01 ENCOUNTER — Emergency Department: Payer: 59

## 2015-09-01 DIAGNOSIS — Z79899 Other long term (current) drug therapy: Secondary | ICD-10-CM | POA: Insufficient documentation

## 2015-09-01 DIAGNOSIS — N200 Calculus of kidney: Secondary | ICD-10-CM | POA: Insufficient documentation

## 2015-09-01 DIAGNOSIS — J45909 Unspecified asthma, uncomplicated: Secondary | ICD-10-CM | POA: Diagnosis not present

## 2015-09-01 DIAGNOSIS — E785 Hyperlipidemia, unspecified: Secondary | ICD-10-CM | POA: Diagnosis not present

## 2015-09-01 DIAGNOSIS — N201 Calculus of ureter: Secondary | ICD-10-CM | POA: Diagnosis not present

## 2015-09-01 DIAGNOSIS — R109 Unspecified abdominal pain: Secondary | ICD-10-CM | POA: Diagnosis present

## 2015-09-01 LAB — CBC
HCT: 39.7 % (ref 35.0–47.0)
Hemoglobin: 13.2 g/dL (ref 12.0–16.0)
MCH: 27.1 pg (ref 26.0–34.0)
MCHC: 33.4 g/dL (ref 32.0–36.0)
MCV: 81.1 fL (ref 80.0–100.0)
Platelets: 264 10*3/uL (ref 150–440)
RBC: 4.89 MIL/uL (ref 3.80–5.20)
RDW: 14.6 % — ABNORMAL HIGH (ref 11.5–14.5)
WBC: 7.8 10*3/uL (ref 3.6–11.0)

## 2015-09-01 LAB — URINALYSIS COMPLETE WITH MICROSCOPIC (ARMC ONLY)
Bilirubin Urine: NEGATIVE
Glucose, UA: NEGATIVE mg/dL
Ketones, ur: NEGATIVE mg/dL
Leukocytes, UA: NEGATIVE
Nitrite: NEGATIVE
Protein, ur: 100 mg/dL — AB
Specific Gravity, Urine: 1.021 (ref 1.005–1.030)
pH: 5 (ref 5.0–8.0)

## 2015-09-01 LAB — BASIC METABOLIC PANEL
Anion gap: 9 (ref 5–15)
BUN: 10 mg/dL (ref 6–20)
CO2: 22 mmol/L (ref 22–32)
Calcium: 8.5 mg/dL — ABNORMAL LOW (ref 8.9–10.3)
Chloride: 110 mmol/L (ref 101–111)
Creatinine, Ser: 0.6 mg/dL (ref 0.44–1.00)
GFR calc Af Amer: >60 mL/min (ref >60)
GFR calc non Af Amer: >60 mL/min (ref >60)
Glucose, Bld: 91 mg/dL (ref 65–99)
Potassium: 3.3 mmol/L — ABNORMAL LOW (ref 3.5–5.1)
Sodium: 141 mmol/L (ref 135–145)

## 2015-09-01 LAB — POCT PREGNANCY, URINE: Preg Test, Ur: NEGATIVE

## 2015-09-01 LAB — LIPASE, BLOOD: Lipase: 31 U/L (ref 11–51)

## 2015-09-01 MED ORDER — CEPHALEXIN 500 MG PO CAPS: 500.0000 mg | ORAL_CAPSULE | Freq: Three times a day (TID) | ORAL | Status: DC

## 2015-09-01 MED ORDER — TAMSULOSIN HCL 0.4 MG PO CAPS: 0.4000 mg | ORAL_CAPSULE | Freq: Every day | ORAL | Status: DC

## 2015-09-01 MED ORDER — ONDANSETRON HCL 4 MG/2ML IJ SOLN
4.0000 mg | Freq: Once | INTRAMUSCULAR | Status: AC
  Administered 2015-09-01: 4 mg via INTRAVENOUS
  Filled 2015-09-01: qty 2

## 2015-09-01 MED ORDER — MORPHINE SULFATE (PF) 4 MG/ML IV SOLN
4.0000 mg | Freq: Once | INTRAVENOUS | Status: AC
  Administered 2015-09-01: 4 mg via INTRAVENOUS
  Filled 2015-09-01: qty 1

## 2015-09-01 MED ORDER — HYDROMORPHONE HCL 1 MG/ML IJ SOLN
0.5000 mg | Freq: Once | INTRAMUSCULAR | Status: AC
  Administered 2015-09-01: 0.5 mg via INTRAVENOUS
  Filled 2015-09-01: qty 1

## 2015-09-01 MED ORDER — DEXTROSE 5 % IV SOLN
1.0000 g | Freq: Once | INTRAVENOUS | Status: AC
  Administered 2015-09-01: 1 g via INTRAVENOUS
  Filled 2015-09-01: qty 10

## 2015-09-01 MED ORDER — OXYCODONE-ACETAMINOPHEN 5-325 MG PO TABS: 1.0000 | ORAL_TABLET | ORAL | Status: DC | PRN

## 2015-09-01 MED ORDER — SODIUM CHLORIDE 0.9 % IV BOLUS (SEPSIS)
1000.0000 mL | Freq: Once | INTRAVENOUS | Status: AC
  Administered 2015-09-01: 1000 mL via INTRAVENOUS

## 2015-09-01 MED ORDER — ONDANSETRON HCL 4 MG PO TABS: 4.0000 mg | ORAL_TABLET | Freq: Three times a day (TID) | ORAL | Status: DC | PRN

## 2015-09-01 NOTE — ED Notes (Signed)
Pt returned from CT, husband at bedside

## 2015-09-01 NOTE — ED Notes (Signed)
Patient transported to CT 

## 2015-09-01 NOTE — ED Notes (Signed)
Right sided flank pain that woke her up around 0500. States nausea and vomiting. Frequency and urgency as well. Patient is uncomfortable during triage.

## 2015-09-01 NOTE — Discharge Instructions (Signed)
Please seek medical attention for any high fevers, chest pain, shortness of breath, change in behavior, persistent vomiting, bloody stool or any other new or concerning symptoms. ° ° °Kidney Stones °Kidney stones (urolithiasis) are deposits that form inside your kidneys. The intense pain is caused by the stone moving through the urinary tract. When the stone moves, the ureter goes into spasm around the stone. The stone is usually passed in the urine.  °CAUSES  °· A disorder that makes certain neck glands produce too much parathyroid hormone (primary hyperparathyroidism). °· A buildup of uric acid crystals, similar to gout in your joints. °· Narrowing (stricture) of the ureter. °· A kidney obstruction present at birth (congenital obstruction). °· Previous surgery on the kidney or ureters. °· Numerous kidney infections. °SYMPTOMS  °· Feeling sick to your stomach (nauseous). °· Throwing up (vomiting). °· Blood in the urine (hematuria). °· Pain that usually spreads (radiates) to the groin. °· Frequency or urgency of urination. °DIAGNOSIS  °· Taking a history and physical exam. °· Blood or urine tests. °· CT scan. °· Occasionally, an examination of the inside of the urinary bladder (cystoscopy) is performed. °TREATMENT  °· Observation. °· Increasing your fluid intake. °· Extracorporeal shock wave lithotripsy--This is a noninvasive procedure that uses shock waves to break up kidney stones. °· Surgery may be needed if you have severe pain or persistent obstruction. There are various surgical procedures. Most of the procedures are performed with the use of small instruments. Only small incisions are needed to accommodate these instruments, so recovery time is minimized. °The size, location, and chemical composition are all important variables that will determine the proper choice of action for you. Talk to your health care provider to better understand your situation so that you will minimize the risk of injury to yourself  and your kidney.  °HOME CARE INSTRUCTIONS  °· Drink enough water and fluids to keep your urine clear or pale yellow. This will help you to pass the stone or stone fragments. °· Strain all urine through the provided strainer. Keep all particulate matter and stones for your health care provider to see. The stone causing the pain may be as small as a grain of salt. It is very important to use the strainer each and every time you pass your urine. The collection of your stone will allow your health care provider to analyze it and verify that a stone has actually passed. The stone analysis will often identify what you can do to reduce the incidence of recurrences. °· Only take over-the-counter or prescription medicines for pain, discomfort, or fever as directed by your health care provider. °· Keep all follow-up visits as told by your health care provider. This is important. °· Get follow-up X-rays if required. The absence of pain does not always mean that the stone has passed. It may have only stopped moving. If the urine remains completely obstructed, it can cause loss of kidney function or even complete destruction of the kidney. It is your responsibility to make sure X-rays and follow-ups are completed. Ultrasounds of the kidney can show blockages and the status of the kidney. Ultrasounds are not associated with any radiation and can be performed easily in a matter of minutes. °· Make changes to your daily diet as told by your health care provider. You may be told to: °¨ Limit the amount of salt that you eat. °¨ Eat 5 or more servings of fruits and vegetables each day. °¨ Limit the amount of meat,   poultry, fish, and eggs that you eat. °· Collect a 24-hour urine sample as told by your health care provider. You may need to collect another urine sample every 6-12 months. °SEEK MEDICAL CARE IF: °· You experience pain that is progressive and unresponsive to any pain medicine you have been prescribed. °SEEK IMMEDIATE  MEDICAL CARE IF:  °· Pain cannot be controlled with the prescribed medicine. °· You have a fever or shaking chills. °· The severity or intensity of pain increases over 18 hours and is not relieved by pain medicine. °· You develop a new onset of abdominal pain. °· You feel faint or pass out. °· You are unable to urinate. °  °This information is not intended to replace advice given to you by your health care provider. Make sure you discuss any questions you have with your health care provider. °  °Document Released: 03/06/2005 Document Revised: 11/25/2014 Document Reviewed: 08/07/2012 °Elsevier Interactive Patient Education ©2016 [employer] ° °

## 2015-09-01 NOTE — ED Provider Notes (Signed)
[facility] Emergency Department Provider Note    ____________________________________________  Time seen: ~0715  I have reviewed the triage vital signs and the nursing notes.   HISTORY  Chief Complaint Flank Pain; Nausea; and Emesis   History limited by: Not Limited   HPI Tammy Christian is a 35 y.o. female who presents to the emergency department today because of concern for right sided flank pain. The pain started roughly 2 hours ago. It is severe. It is sharp. Located in the right flank with some radiation to the groin. It has been accompanied by nausea and vomiting. No associated dysuria. Patient was in her normal state of health when she went to bed last night. Has a history of one kidney stone ten years ago. This pain is somewhat reminiscent of the previous kidney stone.   Past Medical History  Diagnosis Date  . Allergy   . Asthma   . Gastritis 2005  . Anxiety   . Migraines   . Hyperlipidemia   . Vitamin D deficiency disease   . Obesity     Patient Active Problem List   Diagnosis Date Noted  . Vitamin D deficiency 05/01/2014  . History of migraine 04/29/2014  . Obesity 04/29/2014  . Routine general medical examination at a health care facility 04/29/2014  . Generalized anxiety disorder 04/28/2014    Past Surgical History  Procedure Laterality Date  . Cholecystectomy  04/08/2012  . Laprascopic sleeve    . Breast biopsy Right 02/04/2001    Negative c Clip    Current Outpatient Rx  Name  Route  Sig  Dispense  Refill  . albuterol (PROVENTIL HFA;VENTOLIN HFA) 108 (90 BASE) MCG/ACT inhaler   Inhalation   Inhale 2 puffs into the lungs every 4 (four) hours as needed.   3 Inhaler   1   . alprazolam (XANAX) 2 MG tablet   Oral   Take 1 tablet (2 mg total) by mouth daily as needed for sleep.   30 tablet   3   . Azelastine-Fluticasone 137-50 MCG/ACT SUSP   Nasal   Place 1 spray into the nose 2 (two) times daily as needed.   3  Bottle   3   . clindamycin-benzoyl peroxide (BENZACLIN) gel      APPLY 1 APPLICATION TOPICALLY DAILY.   50 g   4   . clonazePAM (KLONOPIN) 2 MG tablet      TAKE 1 TABLET BY MOUTH 2 TIMES DAILY AS NEEDED   60 tablet   5     May refill on or after Jul 22 2015   . desloratadine (CLARINEX) 5 MG tablet   Oral   Take 1 tablet (5 mg total) by mouth daily.   90 tablet   3   . etonogestrel (NEXPLANON) 68 MG IMPL implant   Subdermal   1 each by Subdermal route once.         . famotidine (PEPCID) 40 MG tablet   Oral   Take 1 tablet (40 mg total) by mouth 2 (two) times daily.   60 tablet   5   . lidocaine-prilocaine (EMLA) cream   Topical   Apply 1 application topically as needed.   30 g   3   . metoprolol succinate (TOPROL-XL) 100 MG 24 hr tablet      TAKE 1 TABLET BY MOUTH DAILY. TAKE WITH OR IMMEDIATELY FOLLOWING A MEAL.   90 tablet   3   . montelukast (SINGULAIR) 10 MG tablet  Oral   Take 1 tablet (10 mg total) by mouth at bedtime.   90 tablet   3   . pantoprazole (PROTONIX) 40 MG tablet      TAKE 1 TABLET (40 MG TOTAL) BY MOUTH DAILY.   90 tablet   2   . SUMAtriptan (IMITREX) 100 MG tablet   Oral   Take 1 tablet (100 mg total) by mouth daily as needed.   10 tablet   5   . traMADol (ULTRAM) 50 MG tablet      TAKE 2 TABLETS BY MOUTH EVERY 4 HOURS AS NEEDED   90 tablet   3   . Vitamin D, Ergocalciferol, (DRISDOL) 50000 units CAPS capsule   Oral   Take 1 capsule (50,000 Units total) by mouth every 7 (seven) days.   12 capsule   3     Allergies Other  Family History  Problem Relation Age of Onset  . Cancer Maternal Grandmother   . Heart disease Paternal Grandmother   . Cancer Paternal Grandfather   . Diabetes Father   . Heart disease Father     atrial fib  . Hyperlipidemia Father   . Hypertension Father   . Cancer Father 53    colon polyps    Social History Social History  Substance Use Topics  . Smoking status: Never Smoker   .  Smokeless tobacco: None  . Alcohol Use: 1.2 oz/week    2 Glasses of wine per week    Review of Systems  Constitutional: Negative for fever. Cardiovascular: Negative for chest pain. Respiratory: Negative for shortness of breath. Gastrointestinal: Positive for right flank pain. Genitourinary: Negative for dysuria. Slightly darker urination. Musculoskeletal: Negative for back pain. Skin: Negative for rash. Neurological: Negative for headaches, focal weakness or numbness.  10-point ROS otherwise negative.  ____________________________________________   PHYSICAL EXAM:  VITAL SIGNS: ED Triage Vitals  Enc Vitals Group     BP 09/01/15 0626 141/88 mmHg     Pulse Rate 09/01/15 0626 65     Resp 09/01/15 0626 18     Temp 09/01/15 0626 97.8 F (36.6 C)     Temp Source 09/01/15 0626 Oral     SpO2 09/01/15 0626 99 %     Weight 09/01/15 0616 220 lb (99.791 kg)     Height 09/01/15 0616 5\' 7"  (1.702 m)     Head Cir --      Peak Flow --      Pain Score 09/01/15 0617 8   Constitutional: Alert and oriented. Pacing around the room. Appears uncomfortable.  Eyes: Conjunctivae are normal. PERRL. Normal extraocular movements. ENT   Head: Normocephalic and atraumatic.   Nose: No congestion/rhinnorhea.   Mouth/Throat: Mucous membranes are moist.   Neck: No stridor. Hematological/Lymphatic/Immunilogical: No cervical lymphadenopathy. Cardiovascular: Normal rate, regular rhythm.  No murmurs, rubs, or gallops. Respiratory: Normal respiratory effort without tachypnea nor retractions. Breath sounds are clear and equal bilaterally. No wheezes/rales/rhonchi. Gastrointestinal: Soft and nontender. No distention. Mild right sided CVA tenderness.  Genitourinary: Deferred Musculoskeletal: Normal range of motion in all extremities. No joint effusions.  No lower extremity tenderness nor edema. Neurologic:  Normal speech and language. No gross focal neurologic deficits are appreciated.  Skin:   Skin is warm, dry and intact. No rash noted. Psychiatric: Mood and affect are normal. Speech and behavior are normal. Patient exhibits appropriate insight and judgment.  ____________________________________________    LABS (pertinent positives/negatives)  Labs Reviewed  URINALYSIS COMPLETEWITH MICROSCOPIC ([place] ONLY) - Abnormal;  Notable for the following:    Color, Urine AMBER (*)    APPearance CLOUDY (*)    Hgb urine dipstick 3+ (*)    Protein, ur 100 (*)    Bacteria, UA MANY (*)    Squamous Epithelial / LPF 0-5 (*)    All other components within normal limits  CBC - Abnormal; Notable for the following:    RDW 14.6 (*)    All other components within normal limits  BASIC METABOLIC PANEL - Abnormal; Notable for the following:    Potassium 3.3 (*)    Calcium 8.5 (*)    All other components within normal limits  LIPASE, BLOOD  POC URINE PREG, ED  POCT PREGNANCY, URINE     ____________________________________________   EKG  None  ____________________________________________    RADIOLOGY  CT renal study  IMPRESSION: 4 x 2 mm calculus at the right ureterovesical junction with mild hydronephrosis on the right.  Status post gastric sleeve resection without complicating feature evident.  Spondylolisthesis at L5-S1 with pars defects at L5 bilaterally. Advanced arthropathy noted at L5-S1.  No bowel wall thickening. No bowel obstruction. No abscess. Appendix appears normal.  Gallbladder absent. Prominence of the common bile duct is felt to be due to post cholecystectomy state. No biliary duct mass or calculus evident.  ____________________________________________   PROCEDURES  Procedure(s) performed: None  Critical Care performed: No  ____________________________________________   INITIAL IMPRESSION / ASSESSMENT AND PLAN / ED COURSE  Pertinent labs & imaging results that were available during my care of the patient were reviewed by me and considered in  my medical decision making (see chart for details).  Patient presents with right sided flank pain. Mild cva tenderness on that side. UA concerning for kidney stone, however also has some WBCs and bacteria. Will obtain CT to evaluate for stone.   ----------------------------------------- 9:02 AM on 09/01/2015 -----------------------------------------  CT renal study does show a 4 x 2 mm stone at the right UVJ. Blood work without any leukocytosis. Patient afebrile. At this point unclear if urine is consistent with an infected stone. Given that the patient does not have any serum leukocytosis or fevers Will treat with antiemetics here and discharged with an amount. Did discuss plan with Dr. Erlene Quan with urology. Discussed strict return precautions with the patient. Will give patient urology follow-up.  ____________________________________________   FINAL CLINICAL IMPRESSION(S) / ED DIAGNOSES  Final diagnoses:  Kidney stone     Note: This dictation was prepared with Dragon dictation. Any transcriptional errors that result from this process are unintentional    Nance Pear, MD 09/01/15 [id number]

## 2015-09-01 NOTE — ED Notes (Signed)
Pt reports flank pain, onset around 500 this morning associated with nausea and vomiting and urine frequency. States it feels like a kidney stone due to Hx of kidney stone. Pt holding on right flank. Denies bloody urine.

## 2015-09-01 NOTE — ED Notes (Signed)
Pt pacing in room, states right flank pain, husband at bedside, asking for more nausea medication, pt in no acute distress

## 2015-09-01 NOTE — ED Notes (Signed)
Pt given urine strainer

## 2015-09-02 LAB — URINE CULTURE

## 2015-10-12 ENCOUNTER — Other Ambulatory Visit: Payer: Self-pay | Admitting: Internal Medicine

## 2015-10-19 ENCOUNTER — Encounter: Payer: Self-pay | Admitting: Internal Medicine

## 2015-10-20 MED ORDER — TRAMADOL HCL 50 MG PO TABS: ORAL_TABLET | ORAL | 1 refills | Status: DC

## 2015-10-20 MED ORDER — AZELASTINE-FLUTICASONE 137-50 MCG/ACT NA SUSP: NASAL | 11 refills | Status: DC

## 2015-10-20 NOTE — Telephone Encounter (Signed)
Refills authorized  . Marland KitchenBecause of the new STOP law that went into effect, my policy on refills of controlled substances will require an OV every 3 months , please make her an appt  In September  Thanks  Dr. Derrel Nip

## 2015-12-27 DIAGNOSIS — D225 Melanocytic nevi of trunk: Secondary | ICD-10-CM | POA: Diagnosis not present

## 2015-12-27 DIAGNOSIS — L578 Other skin changes due to chronic exposure to nonionizing radiation: Secondary | ICD-10-CM | POA: Diagnosis not present

## 2015-12-27 DIAGNOSIS — L821 Other seborrheic keratosis: Secondary | ICD-10-CM | POA: Diagnosis not present

## 2015-12-27 DIAGNOSIS — D1800 Hemangioma unspecified site: Secondary | ICD-10-CM | POA: Diagnosis not present

## 2015-12-27 DIAGNOSIS — D229 Melanocytic nevi, unspecified: Secondary | ICD-10-CM | POA: Diagnosis not present

## 2015-12-27 DIAGNOSIS — Z1283 Encounter for screening for malignant neoplasm of skin: Secondary | ICD-10-CM | POA: Diagnosis not present

## 2015-12-27 DIAGNOSIS — Z808 Family history of malignant neoplasm of other organs or systems: Secondary | ICD-10-CM | POA: Diagnosis not present

## 2015-12-27 DIAGNOSIS — L812 Freckles: Secondary | ICD-10-CM | POA: Diagnosis not present

## 2015-12-27 DIAGNOSIS — D485 Neoplasm of uncertain behavior of skin: Secondary | ICD-10-CM | POA: Diagnosis not present

## 2016-01-10 ENCOUNTER — Other Ambulatory Visit: Payer: Self-pay | Admitting: Internal Medicine

## 2016-01-11 ENCOUNTER — Encounter: Payer: Self-pay | Admitting: Internal Medicine

## 2016-01-11 ENCOUNTER — Ambulatory Visit (INDEPENDENT_AMBULATORY_CARE_PROVIDER_SITE_OTHER): Payer: 59 | Admitting: Internal Medicine

## 2016-01-11 DIAGNOSIS — F411 Generalized anxiety disorder: Secondary | ICD-10-CM

## 2016-01-11 MED ORDER — CLONAZEPAM 1 MG PO TABS: ORAL_TABLET | ORAL | 0 refills | Status: DC

## 2016-01-11 MED ORDER — CLONAZEPAM 0.5 MG PO TABS: 0.5000 mg | ORAL_TABLET | Freq: Two times a day (BID) | ORAL | 0 refills | Status: DC | PRN

## 2016-01-11 MED ORDER — DULOXETINE HCL 30 MG PO CPEP: 30.0000 mg | ORAL_CAPSULE | Freq: Every day | ORAL | 0 refills | Status: DC

## 2016-01-11 MED ORDER — CLONAZEPAM 1 MG PO TABS: 1.0000 mg | ORAL_TABLET | Freq: Two times a day (BID) | ORAL | 0 refills | Status: DC | PRN

## 2016-01-11 NOTE — Progress Notes (Signed)
Pre-visit discussion using our clinic review tool. No additional management support is needed unless otherwise documented below in the visit note.  

## 2016-01-11 NOTE — Progress Notes (Signed)
Subjective:  Patient ID: Tammy Christian, female    DOB: 1980-04-21  Age: 35 y.o. MRN: [id number]  CC: The encounter diagnosis was Generalized anxiety disorder.  HPI Tammy Christian presents for follow up on GAD managed with stable doses of clonazepam (2 mg twice daily) and insomnia managed with alprazolam (2 mg daily)   1) Anxiety is out of control.  Started when a new relationship started going [place].   Relationship ended in September and anxiety went out of control  Constant panic attacks.  Went to employee counselling  And several issues identified.  Still having panic attacks.  Wants to get off of clonazepam and start cymbalta.    2) Started having pain several weeks ago,  from the neck down.  In the muscles.  Not the joints.   No swelling,  Bruising or redness.  Pain lasts all day. Using tramadol and tylenol,   3) Kidney stone, riight side 4 x 2 mm kidney stone on CT done by ER doc in June, no UTI   Referred to Urology  But passe it on her own .   Outpatient Medications Prior to Visit  Medication Sig Dispense Refill  . albuterol (PROVENTIL HFA;VENTOLIN HFA) 108 (90 BASE) MCG/ACT inhaler Inhale 2 puffs into the lungs every 4 (four) hours as needed. 3 Inhaler 1  . alprazolam (XANAX) 2 MG tablet Take 1 tablet (2 mg total) by mouth daily as needed for sleep. 30 tablet 3  . clindamycin-benzoyl peroxide (BENZACLIN) gel APPLY 1 APPLICATION TOPICALLY DAILY. 50 g 4  . desloratadine (CLARINEX) 5 MG tablet Take 1 tablet (5 mg total) by mouth daily. 90 tablet 3  . etonogestrel (NEXPLANON) 68 MG IMPL implant 1 each by Subdermal route once.    . famotidine (PEPCID) 20 MG tablet TAKE 2 TABLETS (40 MG TOTAL) BY MOUTH TWICE DAILY 120 tablet 5  . lidocaine-prilocaine (EMLA) cream Apply 1 application topically as needed. 30 g 3  . metoprolol succinate (TOPROL-XL) 100 MG 24 hr tablet TAKE 1 TABLET BY MOUTH DAILY. TAKE WITH OR IMMEDIATELY FOLLOWING A MEAL. 90 tablet 3  . montelukast (SINGULAIR)  10 MG tablet Take 1 tablet (10 mg total) by mouth at bedtime. 90 tablet 3  . pantoprazole (PROTONIX) 40 MG tablet TAKE 1 TABLET (40 MG TOTAL) BY MOUTH DAILY. 90 tablet 2  . SUMAtriptan (IMITREX) 100 MG tablet Take 1 tablet (100 mg total) by mouth daily as needed. 10 tablet 5  . traMADol (ULTRAM) 50 MG tablet TAKE 2 TABLETS BY MOUTH EVERY 4 HOURS AS NEEDED 90 tablet 1  . Vitamin D, Ergocalciferol, (DRISDOL) 50000 units CAPS capsule Take 1 capsule (50,000 Units total) by mouth every 7 (seven) days. 12 capsule 3  . clonazePAM (KLONOPIN) 2 MG tablet TAKE 1 TABLET BY MOUTH 2 TIMES DAILY AS NEEDED 60 tablet 5  . Azelastine-Fluticasone (DYMISTA) 137-50 MCG/ACT SUSP PLACE 1 SPRAY INTO THE NOSE 2 TIMES DAILY AS NEEDED. (Patient not taking: Reported on 01/11/2016) 69 g 11  . cephALEXin (KEFLEX) 500 MG capsule Take 1 capsule (500 mg total) by mouth 3 (three) times daily. 30 capsule 0  . ondansetron (ZOFRAN) 4 MG tablet Take 1 tablet (4 mg total) by mouth every 8 (eight) hours as needed for nausea or vomiting. 20 tablet 0  . oxyCODONE-acetaminophen (ROXICET) 5-325 MG tablet Take 1 tablet by mouth every 4 (four) hours as needed for severe pain. 15 tablet 0  . tamsulosin (FLOMAX) 0.4 MG CAPS capsule Take 1 capsule (0.4 mg  total) by mouth daily. 14 capsule 0   No facility-administered medications prior to visit.     Review of Systems;  Patient denies headache, fevers, malaise, unintentional weight loss, skin rash, eye pain, sinus congestion and sinus pain, sore throat, dysphagia,  hemoptysis , cough, dyspnea, wheezing, chest pain, palpitations, orthopnea, edema, abdominal pain, nausea, melena, diarrhea, constipation, flank pain, dysuria, hematuria, urinary  Frequency, nocturia, numbness, tingling, seizures,  Focal weakness, Loss of consciousness,  Tremor, insomnia, depression, anxiety, and suicidal ideation.      Objective:  BP 108/76   Pulse 82   Temp 98.1 F (36.7 C) (Oral)   Resp 12   Ht 5\' 6"  (1.676  m)   Wt 231 lb 8 oz (105 kg)   SpO2 98%   BMI 37.37 kg/m   BP Readings from Last 3 Encounters:  01/11/16 108/76  09/01/15 117/65  07/12/15 114/68    Wt Readings from Last 3 Encounters:  01/11/16 231 lb 8 oz (105 kg)  09/01/15 225 lb (102.1 kg)  07/12/15 236 lb 8 oz (107.3 kg)    General appearance: alert, cooperative and appears stated age Ears: normal TM's and external ear canals both ears Throat: lips, mucosa, and tongue normal; teeth and gums normal Neck: no adenopathy, no carotid bruit, supple, symmetrical, trachea midline and thyroid not enlarged, symmetric, no tenderness/mass/nodules Back: symmetric, no curvature. ROM normal. No CVA tenderness. Lungs: clear to auscultation bilaterally Heart: regular rate and rhythm, S1, S2 normal, no murmur, click, rub or gallop Abdomen: soft, non-tender; bowel sounds normal; no masses,  no organomegaly Pulses: 2+ and symmetric Skin: Skin color, texture, turgor normal. No rashes or lesions Lymph nodes: Cervical, supraclavicular, and axillary nodes normal.  Lab Results  Component Value Date   HGBA1C 5.4 04/29/2014    Lab Results  Component Value Date   CREATININE 0.60 09/01/2015   CREATININE 0.66 07/12/2015   CREATININE 0.59 04/29/2014    Lab Results  Component Value Date   WBC 7.8 09/01/2015   HGB 13.2 09/01/2015   HCT 39.7 09/01/2015   PLT 264 09/01/2015   GLUCOSE 91 09/01/2015   CHOL 209 (H) 07/12/2015   TRIG 121.0 07/12/2015   HDL 52.10 07/12/2015   LDLCALC 132 (H) 07/12/2015   ALT 17 07/12/2015   AST 17 07/12/2015   NA 141 09/01/2015   K 3.3 (L) 09/01/2015   CL 110 09/01/2015   CREATININE 0.60 09/01/2015   BUN 10 09/01/2015   CO2 22 09/01/2015   TSH 2.56 07/12/2015   HGBA1C 5.4 04/29/2014    Ct Renal Stone Study  Result Date: 09/01/2015 CLINICAL DATA:  One day history of right-sided flank pain. Nausea and vomiting. Urinary frequency EXAM: CT ABDOMEN AND PELVIS WITHOUT CONTRAST TECHNIQUE: Multidetector CT  imaging of the abdomen and pelvis was performed following the standard protocol without oral or intravenous contrast material administration. COMPARISON:  None. FINDINGS: Lower chest:  Lung bases are clear. Hepatobiliary: No focal liver lesions are identified on this noncontrast enhanced study. Gallbladder is absent. Distal common bile duct measures 10 mm without demonstrable mass or calculus seen by CT. Pancreas: There is no pancreatic mass or inflammatory focus. Spleen: No splenic lesions are evident. Adrenals/Urinary Tract: Adrenals appear normal bilaterally. There is no renal mass on either side. There is no hydronephrosis on the left. There is mild hydronephrosis on the right. There is no intrarenal calculus on either side. There is no ureteral calculus on the left. On the right, however, there is a 4 x  2 mm calculus at the right ureterovesical junction, best seen on axial slice 77 series 2 and coronal slice 61 series 5. No other ureteral calculi are evident. There are several phleboliths in the pelvis near but separate from the respective distal ureters. The urinary bladder is midline with wall thickness within normal limits. Stomach/Bowel: The patient is status post gastric sleeve resection. There is no perigastric fluid or wall thickening. There is no mesenteric inflammation in the upper abdominal region. Elsewhere, there is no bowel wall or mesenteric thickening. There is no bowel obstruction. No free air or portal venous air. Vascular/Lymphatic: There is no abdominal aortic aneurysm. No vascular lesions are evident on this noncontrast enhanced study. There is no appreciable adenopathy in the abdomen or pelvis. Reproductive: Uterus is anteverted. There is no pelvic mass or pelvic fluid collection. Other: Appendix appears unremarkable. There is no abscess or ascites in the abdomen or pelvis. Musculoskeletal: There are pars defects at L5 bilaterally with 1.4 cm of anterolisthesis of L5 on S1. There is disc  degeneration at L5-S1 with marked disc space narrowing and vacuum phenomenon. There are no blastic or lytic bone lesions. There is no intramuscular or abdominal wall lesion. IMPRESSION: 4 x 2 mm calculus at the right ureterovesical junction with mild hydronephrosis on the right. Status post gastric sleeve resection without complicating feature evident. Spondylolisthesis at L5-S1 with pars defects at L5 bilaterally. Advanced arthropathy noted at L5-S1. No bowel wall thickening. No bowel obstruction. No abscess. Appendix appears normal. Gallbladder absent. Prominence of the common bile duct is felt to be due to post cholecystectomy state. No biliary duct mass or calculus evident. Electronically Signed   By: Lowella Grip III M.D.   On: 09/01/2015 07:48    Assessment & Plan:   Problem List Items Addressed This Visit    Generalized anxiety disorder    Aggravated by recent relationship dissolution. No longer benefitting from high dose  alprazolam .agree WITH GRADUAL WEAN by 0.5 mg per month  And adding cymbalta both for pain modification and anxiety.        Other Visit Diagnoses   None.     I have discontinued Ms. Gladden's tamsulosin, oxyCODONE-acetaminophen, ondansetron, cephALEXin, and Azelastine-Fluticasone. I am also having her start on DULoxetine, clonazePAM, and clonazePAM. Additionally, I am having her maintain her etonogestrel, lidocaine-prilocaine, albuterol, clindamycin-benzoyl peroxide, pantoprazole, alprazolam, desloratadine, montelukast, Vitamin D (Ergocalciferol), metoprolol succinate, SUMAtriptan, traMADol, famotidine, and clonazePAM.  Meds ordered this encounter  Medications  . DISCONTD: clonazePAM (KLONOPIN) 1 MG tablet    Sig: TAKE 1.5 tablets  MOUTH 2 TIMES DAILY AS NEEDED    Dispense:  90 tablet    Refill:  0    May refill on or after Jul 22 2015  . DULoxetine (CYMBALTA) 30 MG capsule    Sig: Take 1 capsule (30 mg total) by mouth daily. increase to 2 tablets daily after  2 weeks    Dispense:  45 capsule    Refill:  0  . clonazePAM (KLONOPIN) 1 MG tablet    Sig: Take 1 tablet (1 mg total) by mouth 2 (two) times daily as needed for anxiety. MAY REFILL ON OR AFTER February 11 2016    Dispense:  60 tablet    Refill:  0  . clonazePAM (KLONOPIN) 1 MG tablet    Sig: TAKE 1.5 tablets  MOUTH 2 TIMES DAILY AS NEEDED    Dispense:  90 tablet    Refill:  0  . clonazePAM (KLONOPIN) 0.5 MG tablet  Sig: Take 1 tablet (0.5 mg total) by mouth 2 (two) times daily as needed for anxiety. MAY REFILL ON OR AFTER Mar 12 2016    Dispense:  60 tablet    Refill:  0    Medications Discontinued During This Encounter  Medication Reason  . Azelastine-Fluticasone (DYMISTA) 137-50 MCG/ACT SUSP Patient Preference  . cephALEXin (KEFLEX) 500 MG capsule Completed Course  . ondansetron (ZOFRAN) 4 MG tablet Patient Preference  . oxyCODONE-acetaminophen (ROXICET) 5-325 MG tablet Patient Preference  . tamsulosin (FLOMAX) 0.4 MG CAPS capsule Patient Preference  . clonazePAM (KLONOPIN) 2 MG tablet Reorder  . clonazePAM (KLONOPIN) 1 MG tablet Reorder   A total of 25 minutes of face to face time was spent with patient more than half of which was spent in counselling about the above mentioned conditions  and coordination of care    Follow-up: Return in about 3 months (around 04/12/2016).   Crecencio Mc, MD

## 2016-01-11 NOTE — Patient Instructions (Addendum)
I agree with your intention to gradually wean YOURSELF from The clonazepam  For the next month take 1.5 mg twice daily, then 1 mg twice daily,  Then 0.5 mg twice daily  Start the cymbalta in the evening   30 mg daily for the first 2 weeks  Increase to 60 mg after two weeks if tolerating med and no adverse effects   Return in 3 months,  Sooner if needed

## 2016-01-12 NOTE — Assessment & Plan Note (Signed)
Aggravated by recent relationship dissolution. No longer benefitting from high dose  alprazolam .agree WITH GRADUAL WEAN by 0.5 mg per month  And adding cymbalta both for pain modification and anxiety.

## 2016-01-13 ENCOUNTER — Other Ambulatory Visit: Payer: Self-pay | Admitting: Internal Medicine

## 2016-01-13 NOTE — Telephone Encounter (Signed)
Xanax refilled: 07/12/15 Tramadol refilled : 10/20/15.  Pt last seen 01/11/16. Please advise?

## 2016-01-14 MED ORDER — TRAMADOL HCL 50 MG PO TABS: ORAL_TABLET | ORAL | 1 refills | Status: DC

## 2016-01-14 MED ORDER — ALPRAZOLAM 2 MG PO TABS: 2.0000 mg | ORAL_TABLET | Freq: Every day | ORAL | 3 refills | Status: DC | PRN

## 2016-02-07 ENCOUNTER — Other Ambulatory Visit: Payer: Self-pay | Admitting: Internal Medicine

## 2016-02-07 ENCOUNTER — Encounter: Payer: Self-pay | Admitting: Internal Medicine

## 2016-02-08 ENCOUNTER — Other Ambulatory Visit: Payer: Self-pay

## 2016-02-08 MED ORDER — PANTOPRAZOLE SODIUM 40 MG PO TBEC: DELAYED_RELEASE_TABLET | ORAL | 1 refills | Status: DC

## 2016-02-09 ENCOUNTER — Telehealth: Payer: Self-pay | Admitting: *Deleted

## 2016-02-09 NOTE — Telephone Encounter (Signed)
Pt has requested to have a medication refill for Cymbalta 60 mg 90 day supply Pt requested to have this called in to [facility] in [place]

## 2016-02-09 NOTE — Telephone Encounter (Signed)
This is pended already for review. thanks

## 2016-02-13 ENCOUNTER — Encounter: Payer: Self-pay | Admitting: Internal Medicine

## 2016-02-14 ENCOUNTER — Other Ambulatory Visit: Payer: Self-pay | Admitting: Internal Medicine

## 2016-02-14 MED ORDER — DULOXETINE HCL 60 MG PO CPEP: 60.0000 mg | ORAL_CAPSULE | Freq: Every day | ORAL | 1 refills | Status: DC

## 2016-02-14 NOTE — Telephone Encounter (Signed)
See phone note dated 02/09/16 , did this get forwarded to PCP? According to chart patient should have refill for Klonopin .

## 2016-02-27 ENCOUNTER — Encounter: Payer: Self-pay | Admitting: Internal Medicine

## 2016-03-02 MED ORDER — AZELASTINE-FLUTICASONE 137-50 MCG/ACT NA SUSP: NASAL | 11 refills | Status: DC

## 2016-03-02 MED ORDER — LIDOCAINE-PRILOCAINE 2.5-2.5 % EX CREA: 1.0000 "application " | TOPICAL_CREAM | CUTANEOUS | 3 refills | Status: DC | PRN

## 2016-03-02 MED ORDER — CLINDAMYCIN PHOS-BENZOYL PEROX 1.2-5 % EX GEL: 1.0000 "application " | Freq: Every day | CUTANEOUS | 1 refills | Status: DC

## 2016-03-22 DIAGNOSIS — M25562 Pain in left knee: Secondary | ICD-10-CM | POA: Diagnosis not present

## 2016-03-22 DIAGNOSIS — S83015A Lateral dislocation of left patella, initial encounter: Secondary | ICD-10-CM | POA: Diagnosis not present

## 2016-03-30 ENCOUNTER — Telehealth: Payer: Self-pay | Admitting: *Deleted

## 2016-03-30 NOTE — Telephone Encounter (Signed)
Pt stated that she was to be worked in with [facility] on 01/12 @12  this appt time was discussed via text message . Please advise if pt can be scheduled Pt contact [phone] A message can be left on voicemail

## 2016-03-30 NOTE — Telephone Encounter (Signed)
Patient in surgery this afternoon, but is scheduled for 03/31/16 at 4 PM not 4.30.

## 2016-03-30 NOTE — Telephone Encounter (Signed)
Needs urgent work in for injury,  Can she come this afternoon at 1:30 ?

## 2016-03-31 ENCOUNTER — Encounter: Payer: Self-pay | Admitting: Internal Medicine

## 2016-03-31 ENCOUNTER — Ambulatory Visit (INDEPENDENT_AMBULATORY_CARE_PROVIDER_SITE_OTHER): Payer: 59

## 2016-03-31 ENCOUNTER — Ambulatory Visit (INDEPENDENT_AMBULATORY_CARE_PROVIDER_SITE_OTHER): Payer: 59 | Admitting: Internal Medicine

## 2016-03-31 ENCOUNTER — Other Ambulatory Visit: Payer: Self-pay | Admitting: Orthopedic Surgery

## 2016-03-31 VITALS — BP 132/72 | HR 65 | Temp 98.3°F | Ht 66.0 in | Wt 235.6 lb

## 2016-03-31 DIAGNOSIS — S82092A Other fracture of left patella, initial encounter for closed fracture: Secondary | ICD-10-CM | POA: Diagnosis not present

## 2016-03-31 DIAGNOSIS — M5386 Other specified dorsopathies, lumbar region: Secondary | ICD-10-CM

## 2016-03-31 DIAGNOSIS — S83015A Lateral dislocation of left patella, initial encounter: Secondary | ICD-10-CM

## 2016-03-31 DIAGNOSIS — M539 Dorsopathy, unspecified: Secondary | ICD-10-CM | POA: Diagnosis not present

## 2016-03-31 DIAGNOSIS — M545 Low back pain: Secondary | ICD-10-CM | POA: Diagnosis not present

## 2016-03-31 DIAGNOSIS — F411 Generalized anxiety disorder: Secondary | ICD-10-CM

## 2016-03-31 DIAGNOSIS — M5387 Other specified dorsopathies, lumbosacral region: Secondary | ICD-10-CM

## 2016-03-31 DIAGNOSIS — S3992XA Unspecified injury of lower back, initial encounter: Secondary | ICD-10-CM | POA: Diagnosis not present

## 2016-03-31 MED ORDER — TRAMADOL HCL 50 MG PO TABS: ORAL_TABLET | ORAL | 3 refills | Status: DC

## 2016-03-31 MED ORDER — PREDNISONE 10 MG PO TABS: ORAL_TABLET | ORAL | 0 refills | Status: DC

## 2016-03-31 MED ORDER — CLONAZEPAM 0.5 MG PO TABS: 0.5000 mg | ORAL_TABLET | Freq: Every day | ORAL | 1 refills | Status: DC

## 2016-03-31 MED ORDER — GABAPENTIN 100 MG PO CAPS: 100.0000 mg | ORAL_CAPSULE | Freq: Three times a day (TID) | ORAL | 3 refills | Status: DC

## 2016-03-31 NOTE — Patient Instructions (Signed)
Tramadol refilled  Adding gabapentin for sciatica.  100 mg three times daily,  Or 1-3 tablets just at bedtime    Prednisone taper for inflammation.  Suspend mobic while you are on prednisone  One month of clonazepam 0.5 mg at bedtime.  After that you can reduce to 1/2 tablet at bedtime

## 2016-03-31 NOTE — Progress Notes (Signed)
Subjective:  Patient ID: Tammy Christian, female    DOB: [birth year]  Age: 36 y.o. MRN: [id number]  CC: The primary encounter diagnosis was Sciatica of left side associated with disorder of lumbar spine. Diagnoses of Closed sleeve fracture of left patella, initial encounter, Sciatica of left side associated with disorder of lumbosacral spine, and Generalized anxiety disorder were also pertinent to this visit.  HPI Tammy Christian presents for evaluation of back pain with left sided sciatica and left knee pain .  The left knee was dislocated during a fall in the bathroom which occurred while visiting her parents in [place] on  Dec 27th . Patient was able to reduce her dislocation immediately.  She did develop mild bruising on the medial side of the knee along with moderate swelling of the knee which she treated with ice and compression.  Once badk in [place] she saw Orthopedics Hessie Knows,  And an MRI has been ordered after plain films suggested patellar fragments in the joint space.  Her left sided back pain did not start immediately after the fall.  started this week .  Left sided L4-S1 pain shoots down the left lateral leg all the way down to th ankle , feels like pins and needles.  Symptoms are worse in the morning and improves somewhat with nonbending activity,  But is never completely gone and is worse by the end of the day .  No foot drop or stumbling.   Taking tramadol 100 mg at bedtime only   GAD:  Has weaned herself down to 1.0 mg clonazepam at bedtime  Only with plans to continue weaning.   Outpatient Medications Prior to Visit  Medication Sig Dispense Refill  . albuterol (PROVENTIL HFA;VENTOLIN HFA) 108 (90 BASE) MCG/ACT inhaler Inhale 2 puffs into the lungs every 4 (four) hours as needed. 3 Inhaler 1  . alprazolam (XANAX) 2 MG tablet Take 1 tablet (2 mg total) by mouth daily as needed for sleep. 30 tablet 3  . Azelastine-Fluticasone (DYMISTA) 137-50 MCG/ACT SUSP PLACE 1 SPRAY INTO THE  NOSE 2 TIMES DAILY AS NEEDED. 69 g 11  . Clindamycin-Benzoyl Per, Refr, gel Apply 1 application topically daily. 45 g 1  . desloratadine (CLARINEX) 5 MG tablet Take 1 tablet (5 mg total) by mouth daily. 90 tablet 3  . DULoxetine (CYMBALTA) 60 MG capsule Take 1 capsule (60 mg total) by mouth daily. 90 capsule 1  . etonogestrel (NEXPLANON) 68 MG IMPL implant 1 each by Subdermal route once.    . famotidine (PEPCID) 20 MG tablet TAKE 2 TABLETS (40 MG TOTAL) BY MOUTH TWICE DAILY 120 tablet 5  . lidocaine-prilocaine (EMLA) cream Apply 1 application topically as needed. 30 g 3  . metoprolol succinate (TOPROL-XL) 100 MG 24 hr tablet TAKE 1 TABLET BY MOUTH DAILY. TAKE WITH OR IMMEDIATELY FOLLOWING A MEAL. 90 tablet 3  . montelukast (SINGULAIR) 10 MG tablet Take 1 tablet (10 mg total) by mouth at bedtime. 90 tablet 3  . pantoprazole (PROTONIX) 40 MG tablet TAKE 1 TABLET (40 MG TOTAL) BY MOUTH DAILY. 90 tablet 1  . SUMAtriptan (IMITREX) 100 MG tablet Take 1 tablet (100 mg total) by mouth daily as needed. 10 tablet 5  . Vitamin D, Ergocalciferol, (DRISDOL) 50000 units CAPS capsule Take 1 capsule (50,000 Units total) by mouth every 7 (seven) days. 12 capsule 3  . traMADol (ULTRAM) 50 MG tablet TAKE 2 TABLETS BY MOUTH EVERY 4 HOURS AS NEEDED 90 tablet 1  . clindamycin-benzoyl peroxide (BENZACLIN)  gel APPLY 1 APPLICATION TOPICALLY DAILY. 50 g 4  . clonazePAM (KLONOPIN) 0.5 MG tablet Take 1 tablet (0.5 mg total) by mouth 2 (two) times daily as needed for anxiety. MAY REFILL ON OR AFTER Mar 12 2016 60 tablet 0  . clonazePAM (KLONOPIN) 1 MG tablet Take 1 tablet (1 mg total) by mouth 2 (two) times daily as needed for anxiety. MAY REFILL ON OR AFTER February 11 2016 60 tablet 0  . clonazePAM (KLONOPIN) 1 MG tablet TAKE 1.5 tablets  MOUTH 2 TIMES DAILY AS NEEDED 90 tablet 0   No facility-administered medications prior to visit.     Review of Systems;  Patient denies headache, fevers, malaise, unintentional weight  loss, skin rash, eye pain, sinus congestion and sinus pain, sore throat, dysphagia,  hemoptysis , cough, dyspnea, wheezing, chest pain, palpitations, orthopnea, edema, abdominal pain, nausea, melena, diarrhea, constipation, flank pain, dysuria, hematuria, urinary  Frequency, nocturia, numbness, tingling, seizures,  Focal weakness, Loss of consciousness,  Tremor, insomnia, depression, anxiety, and suicidal ideation.      Objective:  BP 132/72   Pulse 65   Temp 98.3 F (36.8 C) (Oral)   Ht 5\' 6"  (1.676 m)   Wt 235 lb 9.6 oz (106.9 kg)   SpO2 98%   BMI 38.03 kg/m   BP Readings from Last 3 Encounters:  03/31/16 132/72  01/11/16 108/76  09/01/15 117/65    Wt Readings from Last 3 Encounters:  03/31/16 235 lb 9.6 oz (106.9 kg)  01/11/16 231 lb 8 oz (105 kg)  09/01/15 225 lb (102.1 kg)    General appearance: alert, cooperative and appears stated age Ears: normal TM's and external ear canals both ears Throat: lips, mucosa, and tongue normal; teeth and gums normal Neck: no adenopathy, no carotid bruit, supple, symmetrical, trachea midline and thyroid not enlarged, symmetric, no tenderness/mass/nodules Back: symmetric, no curvature. ROM normal. No CVA tenderness. Lungs: clear to auscultation bilaterally Heart: regular rate and rhythm, S1, S2 normal, no murmur, click, rub or gallop Abdomen: soft, non-tender; bowel sounds normal; no masses,  no organomegaly Pulses: 2+ and symmetric Skin: Skin color, texture, turgor normal. No rashes or lesions Lymph nodes: Cervical, supraclavicular, and axillary nodes normal.  Lab Results  Component Value Date   HGBA1C 5.4 04/29/2014    Lab Results  Component Value Date   CREATININE 0.60 09/01/2015   CREATININE 0.66 07/12/2015   CREATININE 0.59 04/29/2014    Lab Results  Component Value Date   WBC 7.8 09/01/2015   HGB 13.2 09/01/2015   HCT 39.7 09/01/2015   PLT 264 09/01/2015   GLUCOSE 91 09/01/2015   CHOL 209 (H) 07/12/2015   TRIG 121.0  07/12/2015   HDL 52.10 07/12/2015   LDLCALC 132 (H) 07/12/2015   ALT 17 07/12/2015   AST 17 07/12/2015   NA 141 09/01/2015   K 3.3 (L) 09/01/2015   CL 110 09/01/2015   CREATININE 0.60 09/01/2015   BUN 10 09/01/2015   CO2 22 09/01/2015   TSH 2.56 07/12/2015   HGBA1C 5.4 04/29/2014    Ct Renal Stone Study  Result Date: 09/01/2015 CLINICAL DATA:  One day history of right-sided flank pain. Nausea and vomiting. Urinary frequency EXAM: CT ABDOMEN AND PELVIS WITHOUT CONTRAST TECHNIQUE: Multidetector CT imaging of the abdomen and pelvis was performed following the standard protocol without oral or intravenous contrast material administration. COMPARISON:  None. FINDINGS: Lower chest:  Lung bases are clear. Hepatobiliary: No focal liver lesions are identified on this noncontrast enhanced study.  Gallbladder is absent. Distal common bile duct measures 10 mm without demonstrable mass or calculus seen by CT. Pancreas: There is no pancreatic mass or inflammatory focus. Spleen: No splenic lesions are evident. Adrenals/Urinary Tract: Adrenals appear normal bilaterally. There is no renal mass on either side. There is no hydronephrosis on the left. There is mild hydronephrosis on the right. There is no intrarenal calculus on either side. There is no ureteral calculus on the left. On the right, however, there is a 4 x 2 mm calculus at the right ureterovesical junction, best seen on axial slice 77 series 2 and coronal slice 61 series 5. No other ureteral calculi are evident. There are several phleboliths in the pelvis near but separate from the respective distal ureters. The urinary bladder is midline with wall thickness within normal limits. Stomach/Bowel: The patient is status post gastric sleeve resection. There is no perigastric fluid or wall thickening. There is no mesenteric inflammation in the upper abdominal region. Elsewhere, there is no bowel wall or mesenteric thickening. There is no bowel obstruction. No  free air or portal venous air. Vascular/Lymphatic: There is no abdominal aortic aneurysm. No vascular lesions are evident on this noncontrast enhanced study. There is no appreciable adenopathy in the abdomen or pelvis. Reproductive: Uterus is anteverted. There is no pelvic mass or pelvic fluid collection. Other: Appendix appears unremarkable. There is no abscess or ascites in the abdomen or pelvis. Musculoskeletal: There are pars defects at L5 bilaterally with 1.4 cm of anterolisthesis of L5 on S1. There is disc degeneration at L5-S1 with marked disc space narrowing and vacuum phenomenon. There are no blastic or lytic bone lesions. There is no intramuscular or abdominal wall lesion. IMPRESSION: 4 x 2 mm calculus at the right ureterovesical junction with mild hydronephrosis on the right. Status post gastric sleeve resection without complicating feature evident. Spondylolisthesis at L5-S1 with pars defects at L5 bilaterally. Advanced arthropathy noted at L5-S1. No bowel wall thickening. No bowel obstruction. No abscess. Appendix appears normal. Gallbladder absent. Prominence of the common bile duct is felt to be due to post cholecystectomy state. No biliary duct mass or calculus evident. Electronically Signed   By: Lowella Grip III M.D.   On: 09/01/2015 07:48    Assessment & Plan:   Problem List Items Addressed This Visit    Generalized anxiety disorder    Aggravated by recent relationship dissolution. continuing gradual wean of clonazepam by 0.5 mg per month  And continue cymbalta both for pain modification and anxiety.       Patellar sleeve fracture of left knee    Secondary to fall.  MRi and management by Ortho Rudene Christians)      Sciatica of left side associated with disorder of lumbosacral spine    Likely Secondary to a chronic severe pars defect bilaterally at L5 with anterolisthesis noted  Of L5 on S1 on plain films and aggravated by antalgic gait from patellar fracture.  Prednisone taper.  MRI  lumbar spine advised.  Gabapentin added for pain and tramadol refilled.        Relevant Medications   gabapentin (NEURONTIN) 100 MG capsule   clonazePAM (KLONOPIN) 0.5 MG tablet   Other Relevant Orders   MR LUMBAR SPINE WO CONTRAST    Other Visit Diagnoses    Sciatica of left side associated with disorder of lumbar spine    -  Primary   Relevant Medications   gabapentin (NEURONTIN) 100 MG capsule   clonazePAM (KLONOPIN) 0.5 MG tablet  Other Relevant Orders   DG Lumbar Spine Complete (Completed)      I have discontinued Ms. Olivarez's clindamycin-benzoyl peroxide, clonazePAM, and clonazePAM. I have also changed her clonazePAM. Additionally, I am having her start on predniSONE and gabapentin. Lastly, I am having her maintain her etonogestrel, albuterol, desloratadine, montelukast, Vitamin D (Ergocalciferol), metoprolol succinate, SUMAtriptan, famotidine, alprazolam, pantoprazole, DULoxetine, Clindamycin-Benzoyl Per (Refr), lidocaine-prilocaine, Azelastine-Fluticasone, and traMADol.  Meds ordered this encounter  Medications  . traMADol (ULTRAM) 50 MG tablet    Sig: TAKE 2 TABLETS BY MOUTH EVERY 4 HOURS AS NEEDED    Dispense:  90 tablet    Refill:  3  . predniSONE (DELTASONE) 10 MG tablet    Sig: 6 tablets on Day 1 , then reduce by 1 tablet daily until gone    Dispense:  21 tablet    Refill:  0  . gabapentin (NEURONTIN) 100 MG capsule    Sig: Take 1 capsule (100 mg total) by mouth 3 (three) times daily.    Dispense:  90 capsule    Refill:  3  . clonazePAM (KLONOPIN) 0.5 MG tablet    Sig: Take 1 tablet (0.5 mg total) by mouth at bedtime. MAY REFILL ON OR AFTER Mar 12 2016    Dispense:  30 tablet    Refill:  1    Medications Discontinued During This Encounter  Medication Reason  . clonazePAM (KLONOPIN) 1 MG tablet Patient has not taken in last 30 days  . clonazePAM (KLONOPIN) 1 MG tablet Patient has not taken in last 30 days  . clonazePAM (KLONOPIN) 0.5 MG tablet Patient has  not taken in last 30 days  . clindamycin-benzoyl peroxide (BENZACLIN) gel Patient has not taken in last 30 days  . traMADol (ULTRAM) 50 MG tablet Reorder    Follow-up: No Follow-up on file.   Crecencio Mc, MD

## 2016-03-31 NOTE — Progress Notes (Signed)
Pre visit review using our clinic review tool, if applicable. No additional management support is needed unless otherwise documented below in the visit note. 

## 2016-04-02 ENCOUNTER — Encounter: Payer: Self-pay | Admitting: Internal Medicine

## 2016-04-02 DIAGNOSIS — M5387 Other specified dorsopathies, lumbosacral region: Secondary | ICD-10-CM | POA: Insufficient documentation

## 2016-04-02 DIAGNOSIS — S82092A Other fracture of left patella, initial encounter for closed fracture: Secondary | ICD-10-CM | POA: Insufficient documentation

## 2016-04-02 NOTE — Assessment & Plan Note (Signed)
Secondary to fall.  MRi and management by Ortho Rudene Christians)

## 2016-04-02 NOTE — Assessment & Plan Note (Addendum)
Aggravated by recent relationship dissolution. continuing gradual wean of clonazepam by 0.5 mg per month  And continue cymbalta both for pain modification and anxiety.

## 2016-04-02 NOTE — Assessment & Plan Note (Addendum)
Likely Secondary to a chronic severe pars defect bilaterally at L5 with anterolisthesis noted  Of L5 on S1 on plain films and aggravated by antalgic gait from patellar fracture.  Prednisone taper.  MRI lumbar spine advised.  Gabapentin added for pain and tramadol refilled.

## 2016-04-06 ENCOUNTER — Encounter: Payer: Self-pay | Admitting: Internal Medicine

## 2016-04-06 DIAGNOSIS — M25562 Pain in left knee: Secondary | ICD-10-CM | POA: Diagnosis not present

## 2016-04-07 ENCOUNTER — Other Ambulatory Visit: Payer: Self-pay | Admitting: Internal Medicine

## 2016-04-07 MED ORDER — OXYCODONE-ACETAMINOPHEN 5-325 MG PO TABS: 1.0000 | ORAL_TABLET | Freq: Three times a day (TID) | ORAL | 0 refills | Status: DC | PRN

## 2016-04-10 ENCOUNTER — Encounter: Payer: Self-pay | Admitting: Internal Medicine

## 2016-04-10 ENCOUNTER — Other Ambulatory Visit: Payer: Self-pay | Admitting: Internal Medicine

## 2016-04-11 ENCOUNTER — Other Ambulatory Visit: Payer: Self-pay | Admitting: Internal Medicine

## 2016-04-11 ENCOUNTER — Other Ambulatory Visit (INDEPENDENT_AMBULATORY_CARE_PROVIDER_SITE_OTHER): Payer: 59

## 2016-04-11 DIAGNOSIS — Z791 Long term (current) use of non-steroidal anti-inflammatories (NSAID): Secondary | ICD-10-CM

## 2016-04-11 DIAGNOSIS — M5387 Other specified dorsopathies, lumbosacral region: Secondary | ICD-10-CM

## 2016-04-11 MED ORDER — TRAMADOL HCL 50 MG PO TABS: ORAL_TABLET | ORAL | 3 refills | Status: DC

## 2016-04-12 ENCOUNTER — Encounter: Payer: Self-pay | Admitting: Internal Medicine

## 2016-04-12 ENCOUNTER — Telehealth: Payer: Self-pay

## 2016-04-12 ENCOUNTER — Other Ambulatory Visit: Payer: Self-pay

## 2016-04-12 LAB — BASIC METABOLIC PANEL
BUN: 14 mg/dL (ref 6–23)
CO2: 28 mEq/L (ref 19–32)
Calcium: 8.7 mg/dL (ref 8.4–10.5)
Chloride: 104 mEq/L (ref 96–112)
Creatinine, Ser: 0.71 mg/dL (ref 0.40–1.20)
GFR: 98.35 mL/min (ref >60.00)
Glucose, Bld: 94 mg/dL (ref 70–99)
Potassium: 4.7 mEq/L (ref 3.5–5.1)
Sodium: 137 mEq/L (ref 135–145)

## 2016-04-12 NOTE — Telephone Encounter (Signed)
Rx faxed to pharmacy  

## 2016-04-13 ENCOUNTER — Ambulatory Visit
Admission: RE | Admit: 2016-04-13 | Discharge: 2016-04-13 | Disposition: A | Discharge status: Home / Self Care | Payer: 59 | Source: Ambulatory Visit | Attending: Internal Medicine | Admitting: Internal Medicine

## 2016-04-13 ENCOUNTER — Ambulatory Visit: Payer: 59

## 2016-04-13 ENCOUNTER — Ambulatory Visit
Admission: RE | Admit: 2016-04-13 | Discharge: 2016-04-13 | Disposition: A | Discharge status: Home / Self Care | Payer: 59 | Source: Ambulatory Visit | Attending: Orthopedic Surgery | Admitting: Orthopedic Surgery

## 2016-04-13 ENCOUNTER — Encounter: Payer: Self-pay | Admitting: Internal Medicine

## 2016-04-13 DIAGNOSIS — M25562 Pain in left knee: Secondary | ICD-10-CM | POA: Diagnosis not present

## 2016-04-13 DIAGNOSIS — M47816 Spondylosis without myelopathy or radiculopathy, lumbar region: Secondary | ICD-10-CM | POA: Insufficient documentation

## 2016-04-13 DIAGNOSIS — M5126 Other intervertebral disc displacement, lumbar region: Secondary | ICD-10-CM | POA: Diagnosis not present

## 2016-04-13 DIAGNOSIS — M539 Dorsopathy, unspecified: Secondary | ICD-10-CM | POA: Diagnosis not present

## 2016-04-13 DIAGNOSIS — M545 Low back pain: Secondary | ICD-10-CM | POA: Diagnosis not present

## 2016-04-13 DIAGNOSIS — S83015A Lateral dislocation of left patella, initial encounter: Secondary | ICD-10-CM

## 2016-04-13 DIAGNOSIS — M48061 Spinal stenosis, lumbar region without neurogenic claudication: Secondary | ICD-10-CM | POA: Insufficient documentation

## 2016-04-13 DIAGNOSIS — M5387 Other specified dorsopathies, lumbosacral region: Secondary | ICD-10-CM

## 2016-04-17 ENCOUNTER — Ambulatory Visit: Payer: Self-pay | Admitting: Internal Medicine

## 2016-04-18 DIAGNOSIS — M25562 Pain in left knee: Secondary | ICD-10-CM | POA: Diagnosis not present

## 2016-04-24 ENCOUNTER — Ambulatory Visit (INDEPENDENT_AMBULATORY_CARE_PROVIDER_SITE_OTHER): Payer: 59 | Admitting: Internal Medicine

## 2016-04-24 ENCOUNTER — Encounter: Payer: Self-pay | Admitting: Internal Medicine

## 2016-04-24 VITALS — HR 76 | Temp 97.5°F | Wt 256.6 lb

## 2016-04-24 DIAGNOSIS — M539 Dorsopathy, unspecified: Secondary | ICD-10-CM

## 2016-04-24 DIAGNOSIS — M25562 Pain in left knee: Secondary | ICD-10-CM | POA: Diagnosis not present

## 2016-04-24 DIAGNOSIS — M5387 Other specified dorsopathies, lumbosacral region: Secondary | ICD-10-CM

## 2016-04-24 DIAGNOSIS — F411 Generalized anxiety disorder: Secondary | ICD-10-CM | POA: Diagnosis not present

## 2016-04-24 DIAGNOSIS — S82092A Other fracture of left patella, initial encounter for closed fracture: Secondary | ICD-10-CM

## 2016-04-24 MED ORDER — METHYLPREDNISOLONE ACETATE 80 MG/ML IJ SUSP
80.0000 mg | Freq: Once | INTRAMUSCULAR | Status: AC
  Administered 2016-04-24: 80 mg via INTRAMUSCULAR

## 2016-04-24 MED ORDER — OXYCODONE-ACETAMINOPHEN 10-325 MG PO TABS: 1.0000 | ORAL_TABLET | Freq: Four times a day (QID) | ORAL | 0 refills | Status: DC | PRN

## 2016-04-24 MED ORDER — PREDNISONE 10 MG PO TABS: ORAL_TABLET | ORAL | 0 refills | Status: DC

## 2016-04-24 NOTE — Patient Instructions (Signed)
I gave you an 80 mg IM injection of depo medrol in your left deltoid  You can start the steroid taper tomorrow morning  I changed the potency of the percocet to 10 mg because you were reaching the maximum dose of acetominophen, so you can use 1 tablet every 6 hours as needed   Work note for last week and this week  u[

## 2016-04-24 NOTE — Progress Notes (Signed)
Subjective:  Patient ID: Tammy Christian, female    DOB: March 05, 1981  Age: 36 y.o. MRN: [id number]  CC: The primary encounter diagnosis was Acute pain of left knee. Diagnoses of Closed sleeve fracture of left patella, initial encounter, Generalized anxiety disorder, and Sciatica of left side associated with disorder of lumbosacral spine were also pertinent to this visit.  HPI Tammy Christian presents for left knee pain  Patient developed severe pain last night during normal activities and has been unable to walk.. She has a history  Of patellar dislocation that occurred last month after falling at her parent's home .  She had an evaluation by dr Rudene Christians and no intervention was planned since she was able to reduce the dislocation.  She has had persistent severe pain and swelling  since then.   IM Depo Medrol 80 mg left deltoid given by dr Derrel Nip  Outpatient Medications Prior to Visit  Medication Sig Dispense Refill  . albuterol (PROVENTIL HFA;VENTOLIN HFA) 108 (90 BASE) MCG/ACT inhaler Inhale 2 puffs into the lungs every 4 (four) hours as needed. 3 Inhaler 1  . alprazolam (XANAX) 2 MG tablet Take 1 tablet (2 mg total) by mouth daily as needed for sleep. 30 tablet 3  . Azelastine-Fluticasone (DYMISTA) 137-50 MCG/ACT SUSP PLACE 1 SPRAY INTO THE NOSE 2 TIMES DAILY AS NEEDED. 69 g 11  . Clindamycin-Benzoyl Per, Refr, gel Apply 1 application topically daily. 45 g 1  . clonazePAM (KLONOPIN) 0.5 MG tablet Take 1 tablet (0.5 mg total) by mouth at bedtime. MAY REFILL ON OR AFTER Mar 12 2016 30 tablet 1  . desloratadine (CLARINEX) 5 MG tablet Take 1 tablet (5 mg total) by mouth daily. 90 tablet 3  . DULoxetine (CYMBALTA) 60 MG capsule Take 1 capsule (60 mg total) by mouth daily. 90 capsule 1  . etonogestrel (NEXPLANON) 68 MG IMPL implant 1 each by Subdermal route once.    . famotidine (PEPCID) 20 MG tablet TAKE 2 TABLETS (40 MG TOTAL) BY MOUTH TWICE DAILY 120 tablet 5  . gabapentin (NEURONTIN) 100 MG  capsule Take 1 capsule (100 mg total) by mouth 3 (three) times daily. 90 capsule 3  . lidocaine-prilocaine (EMLA) cream Apply 1 application topically as needed. 30 g 3  . metoprolol succinate (TOPROL-XL) 100 MG 24 hr tablet TAKE 1 TABLET BY MOUTH DAILY. TAKE WITH OR IMMEDIATELY FOLLOWING A MEAL. 90 tablet 3  . montelukast (SINGULAIR) 10 MG tablet Take 1 tablet (10 mg total) by mouth at bedtime. 90 tablet 3  . pantoprazole (PROTONIX) 40 MG tablet TAKE 1 TABLET (40 MG TOTAL) BY MOUTH DAILY. 90 tablet 1  . predniSONE (DELTASONE) 10 MG tablet 6 tablets on Day 1 , then reduce by 1 tablet daily until gone 21 tablet 0  . SUMAtriptan (IMITREX) 100 MG tablet Take 1 tablet (100 mg total) by mouth daily as needed. 10 tablet 5  . traMADol (ULTRAM) 50 MG tablet TAKE 2 TABLETS BY MOUTH EVERY 4 HOURS AS NEEDED 120 tablet 3  . Vitamin D, Ergocalciferol, (DRISDOL) 50000 units CAPS capsule Take 1 capsule (50,000 Units total) by mouth every 7 (seven) days. 12 capsule 3  . oxyCODONE-acetaminophen (ROXICET) 5-325 MG tablet Take 1 tablet by mouth every 8 (eight) hours as needed for severe pain. 90 tablet 0   No facility-administered medications prior to visit.     Review of Systems;  Patient denies headache, fevers, malaise, unintentional weight loss, skin rash, eye pain, sinus congestion and sinus pain, sore throat, dysphagia,  hemoptysis , cough, dyspnea, wheezing, chest pain, palpitations, orthopnea, edema, abdominal pain, nausea, melena, diarrhea, constipation, flank pain, dysuria, hematuria, urinary  Frequency, nocturia, numbness, tingling, seizures,  Focal weakness, Loss of consciousness,  Tremor, insomnia, depression, anxiety, and suicidal ideation.      Objective:  Pulse 76   Temp 97.5 F (36.4 C) (Oral)   Wt 256 lb 9.6 oz (116.4 kg)   SpO2 98%   BMI 41.42 kg/m   BP Readings from Last 3 Encounters:  03/31/16 132/72  01/11/16 108/76  09/01/15 117/65    Wt Readings from Last 3 Encounters:    04/24/16 256 lb 9.6 oz (116.4 kg)  03/31/16 235 lb 9.6 oz (106.9 kg)  01/11/16 231 lb 8 oz (105 kg)    General appearance: alert, cooperative and appears stated age Ears: normal TM's and external ear canals both ears Throat: lips, mucosa, and tongue normal; teeth and gums normal Neck: no adenopathy, no carotid bruit, supple, symmetrical, trachea midline and thyroid not enlarged, symmetric, no tenderness/mass/nodules Back: symmetric, no curvature. ROM normal. No CVA tenderness. Lungs: clear to auscultation bilaterally Heart: regular rate and rhythm, S1, S2 normal, no murmur, click, rub or gallop Abdomen: soft, non-tender; bowel sounds normal; no masses,  no organomegaly Pulses: 2+ and symmetric Skin: Skin color, texture, turgor normal. No rashes or lesions Lymph nodes: Cervical, supraclavicular, and axillary nodes normal.  Lab Results  Component Value Date   HGBA1C 5.4 04/29/2014    Lab Results  Component Value Date   CREATININE 0.71 04/11/2016   CREATININE 0.60 09/01/2015   CREATININE 0.66 07/12/2015    Lab Results  Component Value Date   WBC 7.8 09/01/2015   HGB 13.2 09/01/2015   HCT 39.7 09/01/2015   PLT 264 09/01/2015   GLUCOSE 94 04/11/2016   CHOL 209 (H) 07/12/2015   TRIG 121.0 07/12/2015   HDL 52.10 07/12/2015   LDLCALC 132 (H) 07/12/2015   ALT 17 07/12/2015   AST 17 07/12/2015   NA 137 04/11/2016   K 4.7 04/11/2016   CL 104 04/11/2016   CREATININE 0.71 04/11/2016   BUN 14 04/11/2016   CO2 28 04/11/2016   TSH 2.56 07/12/2015   HGBA1C 5.4 04/29/2014    Mr Lumbar Spine Wo Contrast  Result Date: 04/13/2016 CLINICAL DATA:  Low back pain and left leg pain after falling 03/15/2016 EXAM: MRI LUMBAR SPINE WITHOUT CONTRAST TECHNIQUE: Multiplanar, multisequence MR imaging of the lumbar spine was performed. No intravenous contrast was administered. COMPARISON:  Radiography 03/31/2016.  CT 09/01/2015. FINDINGS: Segmentation:  5 lumbar type vertebral bodies.  Alignment:  2 mm retrolisthesis L4-5.  1 cm anterolisthesis L5-S1. Vertebrae: No fracture or primary bone lesion. Chronic discogenic endplate changes at 075-GRM. Chronic bilateral pars defects at L5. Conus medullaris: Extends to the T12-L1 level and appears normal. Paraspinal and other soft tissues: Negative Disc levels: No abnormality at L2-3 or above. L3-4: Desiccation and mild bulging of the disc. No stenosis or neural compression. L4-5: Desiccation and bulging of the disc. Mild facet and ligamentous hypertrophy. No compressive stenosis. L5-S1: Chronic bilateral pars defects at L5 with 1 cm of anterolisthesis. Chronic disc degeneration with loss of disc height and circumferential extrusion of disc material. No compressive central canal stenosis. Foraminal stenosis left worse than right. Either L5 nerve root could be compressed, more likely the left. IMPRESSION: Chronic bilateral pars defects at L5. 1 cm of anterolisthesis. Chronic disc degeneration with circumferential extrusion. Foraminal stenosis bilaterally left worse than right. Either L5 nerve root could  be compressed, particularly the left. Mild bulging of the discs and mild facet hypertrophy at L3-4 and L4-5. No apparent neural compression at those levels. No acute finding that could be attributed specifically to a fall last month. Electronically Signed   By: Nelson Chimes M.D.   On: 04/13/2016 15:29   Mr Knee Left Wo Contrast  Result Date: 04/13/2016 CLINICAL DATA:  Left knee pain since a fall 03/15/2016. Initial encounter. EXAM: MRI OF THE LEFT KNEE WITHOUT CONTRAST TECHNIQUE: Multiplanar, multisequence MR imaging of the knee was performed. No intravenous contrast was administered. COMPARISON:  None. FINDINGS: MENISCI Medial meniscus:  Intact. Lateral meniscus:  Intact. LIGAMENTS Cruciates:  Intact. Collaterals: Intact. Mild is seen about the medial collateral ligament as it passes along the femur consistent with grade 1 sprain. CARTILAGE  Patellofemoral: Fibrillation and irregularity are seen at the apex the patella. Medial:  Unremarkable. Lateral:  Unremarkable. Joint:  Small effusion. Popliteal Fossa:  No Baker's cyst. Extensor Mechanism: The patient is status post transient lateral dislocation of the patella with bone contusions seen in the medial patellar facet and a minimal impaction fracture in the lateral femoral condyle. Extensive edema is present about the medial patellofemoral ligament at its attachment to the patella and the ligament appears partially torn. There do appear to be intact fibers present, estimated at 50%. Mild edema is seen in the distal vastus medialis consistent with strain. Lateral retinaculum and quadriceps and patellar tendons appear normal. The tibial tubercle-trochlear groove distance is borderline increased at 1.9 cm. The trochlea has a normal configuration. Diminutive medial patellar facet is noted. The tibial tubercle is fragmented with subcutaneous edema anterior to it and a small amount of fluid in the deep infrapatellar bursa. Bones:  As above. Other: None. IMPRESSION: Status post transient lateral dislocation of the patella with a bone contusion medial patellar facet and mild impaction fracture in the lateral femoral condyle. Partial tearing of the medial patellofemoral ligament is identified but there appear to be intact fibers present. Mild strain of the vastus medialis is seen. TT-TG distance is borderline increased at 1.9 cm. Diminutive medial patellar facet noted also predisposes to dislocation. Grade 1 MCL sprain without tear. Osgood-Schlatter disease. Chondromalacia patella. Electronically Signed   By: Inge Rise M.D.   On: 04/13/2016 16:51    Assessment & Plan:   Problem List Items Addressed This Visit    Generalized anxiety disorder    She continues to wean off clonazepam       Patellar sleeve fracture of left knee    With recurrent dislocation.  Im depo Medrol given,  Prednisone taper        Sciatica of left side associated with disorder of lumbosacral spine    Secondary to Chronic bilateral pars defects at L5 with. 1 cm of anterolisthesis, Chronic disc degeneration with circumferential extrusion. Foraminal stenosis bilaterally left worse than right. Either L5 nerve root could be compressed, particularly the left. She is scheduled to see a neurosurgeon for surgery.  She is using 2 percocet every 4 to 6 hours for pain management and is exceeding the 2000 mg tylenol daily dose.  Changing oxycodone to 10/325 , max 4 daily.        Other Visit Diagnoses    Acute pain of left knee    -  Primary   Relevant Medications   methylPREDNISolone acetate (DEPO-MEDROL) injection 80 mg (Completed)      I have discontinued Ms. Cozort's oxyCODONE-acetaminophen. I am also having her start on predniSONE  and oxyCODONE-acetaminophen. Additionally, I am having her maintain her etonogestrel, albuterol, desloratadine, montelukast, Vitamin D (Ergocalciferol), metoprolol succinate, SUMAtriptan, famotidine, alprazolam, pantoprazole, DULoxetine, Clindamycin-Benzoyl Per (Refr), lidocaine-prilocaine, Azelastine-Fluticasone, predniSONE, gabapentin, clonazePAM, and traMADol. We administered methylPREDNISolone acetate.  Meds ordered this encounter  Medications  . predniSONE (DELTASONE) 10 MG tablet    Sig: 6 tablets on Day 1 , then reduce by 1 tablet daily until gone    Dispense:  21 tablet    Refill:  0  . oxyCODONE-acetaminophen (PERCOCET) 10-325 MG tablet    Sig: Take 1 tablet by mouth every 6 (six) hours as needed for pain.    Dispense:  60 tablet    Refill:  0  . methylPREDNISolone acetate (DEPO-MEDROL) injection 80 mg    Medications Discontinued During This Encounter  Medication Reason  . oxyCODONE-acetaminophen (ROXICET) 5-325 MG tablet     Follow-up: No Follow-up on file.   Crecencio Mc, MD

## 2016-04-24 NOTE — Progress Notes (Signed)
Pre visit review using our clinic review tool, if applicable. No additional management support is needed unless otherwise documented below in the visit note. 

## 2016-04-25 NOTE — Assessment & Plan Note (Addendum)
Secondary to Chronic bilateral pars defects at L5 with. 1 cm of anterolisthesis, Chronic disc degeneration with circumferential extrusion. Foraminal stenosis bilaterally left worse than right. Either L5 nerve root could be compressed, particularly the left. She is scheduled to see a neurosurgeon for surgery.  She is using 2 percocet every 4 to 6 hours for pain management and is exceeding the 2000 mg tylenol daily dose.  Changing oxycodone to 10/325 , max 4 daily.

## 2016-04-25 NOTE — Assessment & Plan Note (Signed)
With recurrent dislocation.  Im depo Medrol given,  Prednisone taper

## 2016-04-25 NOTE — Assessment & Plan Note (Signed)
She continues to wean off clonazepam

## 2016-04-27 ENCOUNTER — Other Ambulatory Visit: Payer: Self-pay | Admitting: Internal Medicine

## 2016-04-27 ENCOUNTER — Encounter: Payer: Self-pay | Admitting: Internal Medicine

## 2016-04-27 DIAGNOSIS — M4317 Spondylolisthesis, lumbosacral region: Secondary | ICD-10-CM | POA: Diagnosis not present

## 2016-04-27 DIAGNOSIS — Z6839 Body mass index (BMI) 39.0-39.9, adult: Secondary | ICD-10-CM | POA: Diagnosis not present

## 2016-04-27 MED ORDER — GABAPENTIN 100 MG PO CAPS: 100.0000 mg | ORAL_CAPSULE | Freq: Three times a day (TID) | ORAL | 3 refills | Status: DC

## 2016-04-27 MED ORDER — TRAMADOL HCL 50 MG PO TABS: ORAL_TABLET | ORAL | 3 refills | Status: DC

## 2016-04-28 ENCOUNTER — Other Ambulatory Visit: Payer: Self-pay | Admitting: Internal Medicine

## 2016-04-28 DIAGNOSIS — M2202 Recurrent dislocation of patella, left knee: Secondary | ICD-10-CM | POA: Diagnosis not present

## 2016-04-28 MED ORDER — TRAMADOL HCL 50 MG PO TABS: ORAL_TABLET | ORAL | 3 refills | Status: DC

## 2016-04-30 ENCOUNTER — Telehealth: Payer: Self-pay | Admitting: Internal Medicine

## 2016-04-30 NOTE — Telephone Encounter (Signed)
FMLA form is complete except for administrative info needed 9see posit it notes),  The charge is $50

## 2016-05-01 ENCOUNTER — Ambulatory Visit: Payer: 59 | Admitting: Anesthesiology

## 2016-05-01 ENCOUNTER — Encounter
Admission: RE | Disposition: A | Discharge status: Home / Self Care | Payer: Self-pay | Source: Ambulatory Visit | Attending: Orthopedic Surgery

## 2016-05-01 ENCOUNTER — Encounter: Payer: Self-pay | Admitting: *Deleted

## 2016-05-01 ENCOUNTER — Ambulatory Visit
Admission: RE | Admit: 2016-05-01 | Discharge: 2016-05-01 | Disposition: A | Discharge status: Home / Self Care | Payer: 59 | Source: Ambulatory Visit | Attending: Orthopedic Surgery | Admitting: Orthopedic Surgery

## 2016-05-01 DIAGNOSIS — J4520 Mild intermittent asthma, uncomplicated: Secondary | ICD-10-CM | POA: Insufficient documentation

## 2016-05-01 DIAGNOSIS — M2202 Recurrent dislocation of patella, left knee: Secondary | ICD-10-CM | POA: Diagnosis not present

## 2016-05-01 DIAGNOSIS — E669 Obesity, unspecified: Secondary | ICD-10-CM | POA: Diagnosis not present

## 2016-05-01 DIAGNOSIS — Z6837 Body mass index (BMI) 37.0-37.9, adult: Secondary | ICD-10-CM | POA: Insufficient documentation

## 2016-05-01 DIAGNOSIS — Z7689 Persons encountering health services in other specified circumstances: Secondary | ICD-10-CM

## 2016-05-01 DIAGNOSIS — Z79899 Other long term (current) drug therapy: Secondary | ICD-10-CM | POA: Diagnosis not present

## 2016-05-01 DIAGNOSIS — F419 Anxiety disorder, unspecified: Secondary | ICD-10-CM | POA: Diagnosis not present

## 2016-05-01 DIAGNOSIS — E559 Vitamin D deficiency, unspecified: Secondary | ICD-10-CM | POA: Insufficient documentation

## 2016-05-01 DIAGNOSIS — M25562 Pain in left knee: Secondary | ICD-10-CM | POA: Diagnosis not present

## 2016-05-01 DIAGNOSIS — Z01810 Encounter for preprocedural cardiovascular examination: Secondary | ICD-10-CM | POA: Diagnosis not present

## 2016-05-01 HISTORY — PX: KNEE ARTHROSCOPY: SHX127

## 2016-05-01 LAB — CBC
HCT: 38.4 % (ref 35.0–47.0)
Hemoglobin: 13.2 g/dL (ref 12.0–16.0)
MCH: 27.6 pg (ref 26.0–34.0)
MCHC: 34.4 g/dL (ref 32.0–36.0)
MCV: 80.2 fL (ref 80.0–100.0)
Platelets: 394 10*3/uL (ref 150–440)
RBC: 4.78 MIL/uL (ref 3.80–5.20)
RDW: 15.5 % — ABNORMAL HIGH (ref 11.5–14.5)
WBC: 9.4 10*3/uL (ref 3.6–11.0)

## 2016-05-01 LAB — POCT PREGNANCY, URINE: Preg Test, Ur: NEGATIVE

## 2016-05-01 SURGERY — ARTHROSCOPY, KNEE
Anesthesia: General | Laterality: Left | Wound class: Clean

## 2016-05-01 MED ORDER — LIDOCAINE HCL (PF) 2 % IJ SOLN
INTRAMUSCULAR | Status: AC
  Filled 2016-05-01: qty 2

## 2016-05-01 MED ORDER — CEFAZOLIN SODIUM-DEXTROSE 2-4 GM/100ML-% IV SOLN
INTRAVENOUS | Status: AC
  Filled 2016-05-01: qty 100

## 2016-05-01 MED ORDER — KETOROLAC TROMETHAMINE 30 MG/ML IJ SOLN
INTRAMUSCULAR | Status: DC | PRN
  Administered 2016-05-01: 30 mg via INTRAVENOUS

## 2016-05-01 MED ORDER — FENTANYL CITRATE (PF) 100 MCG/2ML IJ SOLN
INTRAMUSCULAR | Status: DC
Start: 2016-05-01 — End: 2016-05-01
  Filled 2016-05-01: qty 2

## 2016-05-01 MED ORDER — OXYCODONE-ACETAMINOPHEN 5-325 MG PO TABS: 1.0000 | ORAL_TABLET | ORAL | Status: DC | PRN

## 2016-05-01 MED ORDER — KETOROLAC TROMETHAMINE 30 MG/ML IJ SOLN
INTRAMUSCULAR | Status: AC
  Filled 2016-05-01: qty 1

## 2016-05-01 MED ORDER — PROPOFOL 10 MG/ML IV BOLUS
INTRAVENOUS | Status: DC | PRN
  Administered 2016-05-01: 200 mg via INTRAVENOUS

## 2016-05-01 MED ORDER — OXYCODONE HCL 5 MG PO TABS: 5.0000 mg | ORAL_TABLET | ORAL | 0 refills | Status: DC | PRN

## 2016-05-01 MED ORDER — PHENYLEPHRINE HCL 10 MG/ML IJ SOLN
INTRAMUSCULAR | Status: AC
  Filled 2016-05-01: qty 1

## 2016-05-01 MED ORDER — ACETAMINOPHEN 10 MG/ML IV SOLN
INTRAVENOUS | Status: AC
  Filled 2016-05-01: qty 100

## 2016-05-01 MED ORDER — ONDANSETRON HCL 4 MG PO TABS: 4.0000 mg | ORAL_TABLET | Freq: Four times a day (QID) | ORAL | Status: DC | PRN

## 2016-05-01 MED ORDER — FENTANYL CITRATE (PF) 100 MCG/2ML IJ SOLN
INTRAMUSCULAR | Status: DC | PRN
  Administered 2016-05-01: 100 ug via INTRAVENOUS

## 2016-05-01 MED ORDER — ONDANSETRON HCL 4 MG/2ML IJ SOLN
INTRAMUSCULAR | Status: DC | PRN
  Administered 2016-05-01: 4 mg via INTRAVENOUS

## 2016-05-01 MED ORDER — LACTATED RINGERS IV SOLN
INTRAVENOUS | Status: DC
  Administered 2016-05-01: 12:00:00 via INTRAVENOUS

## 2016-05-01 MED ORDER — METOCLOPRAMIDE HCL 5 MG/ML IJ SOLN: 5.0000 mg | Freq: Three times a day (TID) | INTRAMUSCULAR | Status: DC | PRN

## 2016-05-01 MED ORDER — BUPIVACAINE-EPINEPHRINE (PF) 0.5% -1:200000 IJ SOLN
INTRAMUSCULAR | Status: DC | PRN
  Administered 2016-05-01: 30 mL

## 2016-05-01 MED ORDER — OXYCODONE HCL 5 MG PO TABS
5.0000 mg | ORAL_TABLET | Freq: Once | ORAL | Status: AC | PRN
  Administered 2016-05-01: 5 mg via ORAL

## 2016-05-01 MED ORDER — PHENYLEPHRINE HCL 10 MG/ML IJ SOLN
INTRAMUSCULAR | Status: DC | PRN
  Administered 2016-05-01: 100 ug via INTRAVENOUS

## 2016-05-01 MED ORDER — ONDANSETRON HCL 4 MG/2ML IJ SOLN
INTRAMUSCULAR | Status: AC
  Filled 2016-05-01: qty 2

## 2016-05-01 MED ORDER — METOCLOPRAMIDE HCL 10 MG PO TABS: 5.0000 mg | ORAL_TABLET | Freq: Three times a day (TID) | ORAL | Status: DC | PRN

## 2016-05-01 MED ORDER — OXYCODONE HCL 5 MG PO TABS
ORAL_TABLET | ORAL | Status: DC
Start: 2016-05-01 — End: 2016-05-01
  Filled 2016-05-01: qty 1

## 2016-05-01 MED ORDER — MIDAZOLAM HCL 2 MG/2ML IJ SOLN
INTRAMUSCULAR | Status: AC
  Filled 2016-05-01: qty 2

## 2016-05-01 MED ORDER — CEFAZOLIN SODIUM-DEXTROSE 2-4 GM/100ML-% IV SOLN
2.0000 g | Freq: Once | INTRAVENOUS | Status: AC
  Administered 2016-05-01: 2 g via INTRAVENOUS

## 2016-05-01 MED ORDER — BUPIVACAINE-EPINEPHRINE (PF) 0.5% -1:200000 IJ SOLN
INTRAMUSCULAR | Status: AC
  Filled 2016-05-01: qty 30

## 2016-05-01 MED ORDER — OXYCODONE HCL 5 MG/5ML PO SOLN: 5.0000 mg | Freq: Once | ORAL | Status: AC | PRN

## 2016-05-01 MED ORDER — PROPOFOL 10 MG/ML IV BOLUS
INTRAVENOUS | Status: AC
  Filled 2016-05-01: qty 20

## 2016-05-01 MED ORDER — ACETAMINOPHEN 10 MG/ML IV SOLN
INTRAVENOUS | Status: DC | PRN
Start: 2016-05-01 — End: 2016-05-01
  Administered 2016-05-01: 1000 mg via INTRAVENOUS

## 2016-05-01 MED ORDER — ONDANSETRON HCL 4 MG/2ML IJ SOLN: 4.0000 mg | Freq: Four times a day (QID) | INTRAMUSCULAR | Status: DC | PRN

## 2016-05-01 MED ORDER — LIDOCAINE HCL (CARDIAC) 20 MG/ML IV SOLN
INTRAVENOUS | Status: DC | PRN
  Administered 2016-05-01: 100 mg via INTRAVENOUS

## 2016-05-01 MED ORDER — MEPERIDINE HCL 25 MG/ML IJ SOLN
6.2500 mg | INTRAMUSCULAR | Status: DC | PRN
Start: 2016-05-01 — End: 2016-05-01

## 2016-05-01 MED ORDER — SODIUM CHLORIDE 0.9 % IV SOLN: INTRAVENOUS | Status: DC

## 2016-05-01 MED ORDER — DEXAMETHASONE SODIUM PHOSPHATE 10 MG/ML IJ SOLN
INTRAMUSCULAR | Status: DC | PRN
  Administered 2016-05-01: 10 mg via INTRAVENOUS

## 2016-05-01 MED ORDER — MIDAZOLAM HCL 2 MG/2ML IJ SOLN
INTRAMUSCULAR | Status: DC | PRN
  Administered 2016-05-01: 2 mg via INTRAVENOUS

## 2016-05-01 MED ORDER — DEXAMETHASONE SODIUM PHOSPHATE 10 MG/ML IJ SOLN
INTRAMUSCULAR | Status: AC
  Filled 2016-05-01: qty 1

## 2016-05-01 MED ORDER — FENTANYL CITRATE (PF) 100 MCG/2ML IJ SOLN
INTRAMUSCULAR | Status: AC
  Filled 2016-05-01: qty 2

## 2016-05-01 MED ORDER — FENTANYL CITRATE (PF) 100 MCG/2ML IJ SOLN
25.0000 ug | INTRAMUSCULAR | Status: DC | PRN
Start: 2016-05-01 — End: 2016-05-01
  Administered 2016-05-01 (×2): 50 ug via INTRAVENOUS

## 2016-05-01 MED ORDER — PROMETHAZINE HCL 25 MG/ML IJ SOLN: 6.2500 mg | INTRAMUSCULAR | Status: DC | PRN

## 2016-05-01 SURGICAL SUPPLY — 28 items
BANDAGE ACE 4X5 VEL STRL LF (GAUZE/BANDAGES/DRESSINGS) IMPLANT
BANDAGE ELASTIC 4 LF NS (GAUZE/BANDAGES/DRESSINGS) ×2 IMPLANT
BLADE FULL RADIUS 3.5 (BLADE) IMPLANT
BLADE INCISOR PLUS 4.5 (BLADE) IMPLANT
BLADE SHAVER 4.5 DBL SERAT CV (CUTTER) IMPLANT
BLADE SHAVER 4.5X7 STR FR (MISCELLANEOUS) IMPLANT
CHLORAPREP W/TINT 26ML (MISCELLANEOUS) ×2 IMPLANT
CUFF TOURN 24 STER (MISCELLANEOUS) IMPLANT
CUFF TOURN 30 STER DUAL PORT (MISCELLANEOUS) IMPLANT
GAUZE SPONGE 4X4 12PLY STRL (GAUZE/BANDAGES/DRESSINGS) ×2 IMPLANT
GLOVE SURG SYN 9.0  PF PI (GLOVE) ×1
GLOVE SURG SYN 9.0 PF PI (GLOVE) ×1 IMPLANT
GOWN SRG 2XL LVL 4 RGLN SLV (GOWNS) ×1 IMPLANT
GOWN STRL NON-REIN 2XL LVL4 (GOWNS) ×1
GOWN STRL REUS W/ TWL LRG LVL3 (GOWN DISPOSABLE) ×2 IMPLANT
GOWN STRL REUS W/TWL LRG LVL3 (GOWN DISPOSABLE) ×2
IV LACTATED RINGER IRRG 3000ML (IV SOLUTION) ×2
IV LR IRRIG 3000ML ARTHROMATIC (IV SOLUTION) ×2 IMPLANT
KIT RM TURNOVER STRD PROC AR (KITS) ×2 IMPLANT
MANIFOLD NEPTUNE II (INSTRUMENTS) ×2 IMPLANT
PACK ARTHROSCOPY KNEE (MISCELLANEOUS) ×2 IMPLANT
SET TUBE SUCT SHAVER OUTFL 24K (TUBING) ×2 IMPLANT
SET TUBE TIP INTRA-ARTICULAR (MISCELLANEOUS) ×2 IMPLANT
SUT ETHILON 4-0 (SUTURE) ×1
SUT ETHILON 4-0 FS2 18XMFL BLK (SUTURE) ×1
SUTURE ETHLN 4-0 FS2 18XMF BLK (SUTURE) ×1 IMPLANT
TUBING ARTHRO INFLOW-ONLY STRL (TUBING) ×2 IMPLANT
WAND HAND CNTRL MULTIVAC 50 (MISCELLANEOUS) ×2 IMPLANT

## 2016-05-01 NOTE — H&P (Signed)
Reviewed paper H+P, will be scanned into chart. No changes noted.  

## 2016-05-01 NOTE — Discharge Instructions (Addendum)
AMBULATORY SURGERY  DISCHARGE INSTRUCTIONS   1) The drugs that you were given will stay in your system until tomorrow so for the next 24 hours you should not:  A) Drive an automobile B) Make any legal decisions C) Drink any alcoholic beverage   2) You may resume regular meals tomorrow.  Today it is better to start with liquids and gradually work up to solid foods.  You may eat anything you prefer, but it is better to start with liquids, then soup and crackers, and gradually work up to solid foods.   3) Please notify your doctor immediately if you have any unusual bleeding, trouble breathing, redness and pain at the surgery site, drainage, fever, or pain not relieved by medication.  4) Your post-operative visit with Dr.                                     is: Date:                        Time:    Please call to schedule your post-operative visit.  5) Additional Instructions: Bearing as tolerated on left leg. Pain medicine as directed. Aspirin 325 mg daily.

## 2016-05-01 NOTE — Op Note (Signed)
05/01/2016  1:16 PM  PATIENT:  Tammy Christian  36 y.o. female  PRE-OPERATIVE DIAGNOSIS:  ACUTE LEFT KNEE PAIN recurrent dislocation patella  POST-OPERATIVE DIAGNOSIS:  ACUTE LEFT KNEE PAIN recurrent dislocation patella  PROCEDURE:  Procedure(s): ARTHROSCOPY KNEE LATERAL RELEASE (Left)  SURGEON: Laurene Footman, MD  ASSISTANTS: None  ANESTHESIA:   general  EBL:  Total I/O In: 800 [I.V.:800] Out: 5 [Blood:5]  BLOOD ADMINISTERED:none  DRAINS: none   LOCAL MEDICATIONS USED:  MARCAINE     SPECIMEN:  No Specimen  DISPOSITION OF SPECIMEN:  N/A  COUNTS:  YES  TOURNIQUET:    IMPLANTS: None  DICTATION: .Dragon Dictation patient brought the operating room and after adequate general anesthesia was obtained the left leg was prepped and draped in sterile fashion. After patient identification and timeout procedures were completed, an inferior lateral portal was made and the scope introduced. Inspection revealed a small plica medially no loose bodies in the gutters and some central fissuring of the articular cartilage of the patella as well as the medial facet ring more significant cartilage damage some of this was smoothed out later with the ArthroCare wand the scope was brought into the medial compartment inferior medial portal was made and the medial meniscus and are here compartment cartilage was normal anterior cruciate ligament intact lateral compartment normal. At this point a lateral releases carried out with extensive release of this appeared to give better position of the patella the knee was irrigated until clear and all argentation was withdrawn. 20 cc of half percent Sensorcaine with epinephrine was infiltrated in the area of the lateral release for postop analgesia with an additional 5 cc in each portal the wounds are closed with simple interrupted 4-0 nylon followed by Xeroform 4 x 4's ABDs web roll and Ace wrap  PLAN OF CARE: Discharge to home after PACU  PATIENT  DISPOSITION:  PACU - hemodynamically stable.

## 2016-05-01 NOTE — Anesthesia Post-op Follow-up Note (Incomplete)
Anesthesia QCDR form completed.        

## 2016-05-01 NOTE — Anesthesia Procedure Notes (Signed)
Procedure Name: LMA Insertion Date/Time: 05/01/2016 12:36 PM Performed by: Nelda Marseille Pre-anesthesia Checklist: Patient identified, Patient being monitored, Timeout performed, Emergency Drugs available and Suction available Patient Re-evaluated:Patient Re-evaluated prior to inductionOxygen Delivery Method: Circle system utilized Preoxygenation: Pre-oxygenation with 100% oxygen Intubation Type: IV induction Ventilation: Mask ventilation without difficulty LMA: LMA inserted LMA Size: 3.5 Tube type: Oral Number of attempts: 1 Placement Confirmation: positive ETCO2 and breath sounds checked- equal and bilateral Tube secured with: Tape Dental Injury: Teeth and Oropharynx as per pre-operative assessment

## 2016-05-01 NOTE — Anesthesia Postprocedure Evaluation (Signed)
Anesthesia Post Note  Patient: Tammy Christian  Procedure(s) Performed: Procedure(s) (LRB): ARTHROSCOPY KNEE LATERAL RELEASE (Left)  Patient location during evaluation: PACU Anesthesia Type: General Level of consciousness: awake and alert and oriented Pain management: pain level controlled Vital Signs Assessment: post-procedure vital signs reviewed and stable Respiratory status: spontaneous breathing, nonlabored ventilation and respiratory function stable Cardiovascular status: blood pressure returned to baseline and stable Postop Assessment: no signs of nausea or vomiting Anesthetic complications: no     Last Vitals:  Vitals:   05/01/16 1352 05/01/16 1404  BP: 110/67 119/68  Pulse: 74 66  Resp: 10 16  Temp:  36.6 C    Last Pain:  Vitals:   05/01/16 1439  TempSrc:   PainSc: 5                  Netra Postlethwait

## 2016-05-01 NOTE — Anesthesia Preprocedure Evaluation (Signed)
Anesthesia Evaluation  Patient identified by MRN, date of birth, ID band Patient awake    Reviewed: Allergy & Precautions, NPO status , Patient's Chart, lab work & pertinent test results  History of Anesthesia Complications Negative for: history of anesthetic complications  Airway Mallampati: II  TM Distance: >3 FB Neck ROM: Full    Dental no notable dental hx.    Pulmonary asthma (mild intermittent) ,    breath sounds clear to auscultation- rhonchi (-) wheezing      Cardiovascular Exercise Tolerance: Good (-) hypertension(-) CAD and (-) Past MI  Rhythm:Regular Rate:Normal - Systolic murmurs and - Diastolic murmurs    Neuro/Psych  Headaches, PSYCHIATRIC DISORDERS Anxiety    GI/Hepatic negative GI ROS, Neg liver ROS,   Endo/Other  negative endocrine ROSneg diabetes  Renal/GU negative Renal ROS     Musculoskeletal negative musculoskeletal ROS (+)   Abdominal (+) + obese,   Peds  Hematology negative hematology ROS (+)   Anesthesia Other Findings Past Medical History: No date: Allergy No date: Anxiety No date: Asthma 2005: Gastritis No date: Hyperlipidemia No date: Migraines No date: Obesity No date: Vitamin D deficiency disease   Reproductive/Obstetrics                             Anesthesia Physical Anesthesia Plan  ASA: II  Anesthesia Plan: General   Post-op Pain Management:    Induction: Intravenous  Airway Management Planned: LMA  Additional Equipment:   Intra-op Plan:   Post-operative Plan:   Informed Consent: I have reviewed the patients History and Physical, chart, labs and discussed the procedure including the risks, benefits and alternatives for the proposed anesthesia with the patient or authorized representative who has indicated his/her understanding and acceptance.   Dental advisory given  Plan Discussed with: CRNA and Anesthesiologist  Anesthesia Plan  Comments:         Anesthesia Quick Evaluation

## 2016-05-01 NOTE — Anesthesia Post-op Follow-up Note (Cosign Needed)
Anesthesia QCDR form completed.        

## 2016-05-01 NOTE — Telephone Encounter (Signed)
Completed form faxed to [phone]. Sent to scan and charge to be entered.

## 2016-05-01 NOTE — Transfer of Care (Signed)
Immediate Anesthesia Transfer of Care Note  Patient: Tammy Christian  Procedure(s) Performed: Procedure(s): ARTHROSCOPY KNEE LATERAL RELEASE (Left)  Patient Location: PACU  Anesthesia Type:General  Level of Consciousness: sedated  Airway & Oxygen Therapy: Patient Spontanous Breathing and Patient connected to face mask oxygen  Post-op Assessment: Report given to RN and Post -op Vital signs reviewed and stable  Post vital signs: Reviewed and stable  Last Vitals:  Vitals:   05/01/16 1040  BP: 130/85  Pulse: 78  Resp: 16  Temp: 36.8 C    Last Pain:  Vitals:   05/01/16 1040  TempSrc: Oral         Complications: No apparent anesthesia complications

## 2016-05-01 NOTE — Addendum Note (Signed)
Addendum  created 05/01/16 1502 by Nelda Marseille, CRNA   Anesthesia Intra Meds edited

## 2016-05-18 DIAGNOSIS — M4317 Spondylolisthesis, lumbosacral region: Secondary | ICD-10-CM | POA: Diagnosis not present

## 2016-06-06 ENCOUNTER — Other Ambulatory Visit: Payer: Self-pay | Admitting: Internal Medicine

## 2016-06-08 NOTE — Telephone Encounter (Signed)
Clindomycin refilled on 03/02/2016  Clonazepam do not see in pt's chart.   Last OV: 04/24/2016 Next OV: 07/31/2016

## 2016-06-08 NOTE — Telephone Encounter (Signed)
Refills authorized,  Please print clonazepam

## 2016-06-09 MED ORDER — CLONAZEPAM 0.5 MG PO TABS: 0.5000 mg | ORAL_TABLET | Freq: Every day | ORAL | 1 refills | Status: DC

## 2016-06-09 NOTE — Telephone Encounter (Signed)
Printed and placed in quick sign folder 

## 2016-06-09 NOTE — Telephone Encounter (Signed)
Signed and faxed

## 2016-06-12 DIAGNOSIS — M4317 Spondylolisthesis, lumbosacral region: Secondary | ICD-10-CM | POA: Diagnosis not present

## 2016-06-12 DIAGNOSIS — M5416 Radiculopathy, lumbar region: Secondary | ICD-10-CM | POA: Diagnosis not present

## 2016-06-13 ENCOUNTER — Encounter: Payer: Self-pay | Admitting: Internal Medicine

## 2016-06-13 ENCOUNTER — Other Ambulatory Visit: Payer: Self-pay | Admitting: Internal Medicine

## 2016-06-14 NOTE — Telephone Encounter (Deleted)
Joelene Millin ,    Your refill was faxed to the pharmacy.        Cyril Mourning ,CMA

## 2016-06-14 NOTE — Telephone Encounter (Signed)
Alprazolam refilled.  

## 2016-06-14 NOTE — Telephone Encounter (Signed)
Printed signed and faxed

## 2016-06-14 NOTE — Telephone Encounter (Signed)
Refilled 01/14/2016 Last OV: 04/24/2016 Next OV: 07/31/2016

## 2016-06-19 ENCOUNTER — Other Ambulatory Visit: Payer: Self-pay | Admitting: Internal Medicine

## 2016-06-19 ENCOUNTER — Encounter: Payer: Self-pay | Admitting: Internal Medicine

## 2016-06-19 MED ORDER — DULOXETINE HCL 30 MG PO CPEP: 30.0000 mg | ORAL_CAPSULE | Freq: Every day | ORAL | 1 refills | Status: DC

## 2016-06-26 MED ORDER — DULOXETINE HCL 30 MG PO CPEP: 30.0000 mg | ORAL_CAPSULE | Freq: Every day | ORAL | 1 refills | Status: DC

## 2016-06-28 MED ORDER — PEN NEEDLES 31G X 6 MM MISC: 0 refills | Status: DC

## 2016-06-28 MED ORDER — LIRAGLUTIDE -WEIGHT MANAGEMENT 18 MG/3ML ~~LOC~~ SOPN: 0.6000 mg | PEN_INJECTOR | Freq: Every day | SUBCUTANEOUS | 0 refills | Status: DC

## 2016-06-28 NOTE — Addendum Note (Signed)
Addended by: Crecencio Mc on: 06/28/2016 11:49 PM   Modules accepted: Orders

## 2016-07-03 ENCOUNTER — Encounter: Payer: Self-pay | Admitting: Internal Medicine

## 2016-07-04 NOTE — Telephone Encounter (Signed)
Spoke with jennifer at [facility], they are re faxing the form to [place] to complete. Thanks.

## 2016-07-04 NOTE — Telephone Encounter (Signed)
Pt called stating that we need to do a PA for this medication. Please advise, thank you!

## 2016-07-05 ENCOUNTER — Telehealth: Payer: Self-pay | Admitting: *Deleted

## 2016-07-05 NOTE — Telephone Encounter (Signed)
[facility] requested a call in reference to the Rx for saxenda.  Contact [phone] Ref# [id number]

## 2016-07-05 NOTE — Telephone Encounter (Signed)
Are you still working on this PA?

## 2016-07-05 NOTE — Telephone Encounter (Signed)
Thank you :)

## 2016-07-05 NOTE — Telephone Encounter (Signed)
I have faxed the paperwork back today.

## 2016-07-09 ENCOUNTER — Other Ambulatory Visit: Payer: Self-pay | Admitting: Internal Medicine

## 2016-07-10 ENCOUNTER — Other Ambulatory Visit: Payer: Self-pay | Admitting: Internal Medicine

## 2016-07-10 MED ORDER — OXYCODONE-ACETAMINOPHEN 10-325 MG PO TABS: 1.0000 | ORAL_TABLET | Freq: Four times a day (QID) | ORAL | 0 refills | Status: DC | PRN

## 2016-07-10 NOTE — Telephone Encounter (Signed)
Refilled for 30 days 

## 2016-07-10 NOTE — Telephone Encounter (Signed)
Pt was called and told that rx was ready up front for pick up.

## 2016-07-10 NOTE — Telephone Encounter (Signed)
Refilled: 04/24/16 Last OV: 04/24/16 Last Labs: 05/01/16 Future OV: 07/31/16 Please advise?

## 2016-07-11 NOTE — Telephone Encounter (Signed)
Tammy Christian [phone] called from [employer] [facility] to follow up on the paperwork. I did advise her that you did fax back the paperwork on 07/05/2016. Pharmacy ran another plan and is still saying PA needed. [name] will contact the plan to try and get some information. Thank you!  Ref key [id number] when calling.

## 2016-07-11 NOTE — Telephone Encounter (Signed)
Spoke with [employer], they state that the plan didn't receive the fax from the 18th, was given two additional numbers to fax the paperwork to, [phone] and [phone].  I have faxed the paperwork to both numbers.

## 2016-07-11 NOTE — Telephone Encounter (Signed)
Vit D Refilled: 07/14/15 Last OV: 07/12/15 for Vit D Last Labs: Future OV: 07/31/16 Please advise?  Metoprolol Refilled:  07/21/15

## 2016-07-12 NOTE — Telephone Encounter (Signed)
Refilled ,  She has chronically low vitamin d

## 2016-07-14 ENCOUNTER — Telehealth: Payer: Self-pay

## 2016-07-14 NOTE — Telephone Encounter (Signed)
Received a fax from pt's insurance stating that they are unable to approve the Saxenda. 3mg /0.76ml. The pt needs to try either contrave, belviq, belviq xr, qsymia, xenical, phentermine, phendimetrazine, benzphetamine, diethylpropion.   Prior authorization determination number: [id number]

## 2016-07-16 ENCOUNTER — Encounter: Payer: Self-pay | Admitting: Internal Medicine

## 2016-07-16 NOTE — Telephone Encounter (Signed)
Please forward the denial  message to patient's Mychart  with the followng additional comments :  Of the ones listed,  The only medications I have any experience with are described below:   .    Belviq and Qsymia : Both medications are oral; they are stimulants. qsymia is a combination of phentermine and topomax. Topomax is a medication that was marketed to prevent migraines, But was found to suppress appetite as well.  So if you have ever used phentermine In the past and did not tolerate it, I would certainly not try it again. Belviq is less of a stimulant than phentermine (newer in fact) but still a very good appetite suppressant and better tolerated but more expensive.  3) The final choice , Contrave,  Is a combination of the antidepressant Wellbutrin , and a drug called naltrexone, which is used to treat addictive behaviors.  If you would like to consider any of these medications, I encourage you to make an appointment to discuss.

## 2016-07-17 NOTE — Telephone Encounter (Signed)
Message has been sent to pt's my chart.

## 2016-07-25 ENCOUNTER — Other Ambulatory Visit: Payer: Self-pay | Admitting: Neurosurgery

## 2016-07-25 DIAGNOSIS — M5416 Radiculopathy, lumbar region: Secondary | ICD-10-CM | POA: Diagnosis not present

## 2016-07-25 DIAGNOSIS — Z6839 Body mass index (BMI) 39.0-39.9, adult: Secondary | ICD-10-CM | POA: Diagnosis not present

## 2016-07-31 ENCOUNTER — Encounter: Payer: Self-pay | Admitting: Internal Medicine

## 2016-07-31 ENCOUNTER — Ambulatory Visit (INDEPENDENT_AMBULATORY_CARE_PROVIDER_SITE_OTHER): Payer: 59 | Admitting: Internal Medicine

## 2016-07-31 VITALS — BP 112/82 | HR 65 | Temp 98.6°F | Resp 16 | Ht 67.0 in | Wt 254.2 lb

## 2016-07-31 DIAGNOSIS — F411 Generalized anxiety disorder: Secondary | ICD-10-CM | POA: Diagnosis not present

## 2016-07-31 DIAGNOSIS — Z6841 Body Mass Index (BMI) 40.0 and over, adult: Secondary | ICD-10-CM | POA: Diagnosis not present

## 2016-07-31 DIAGNOSIS — R5383 Other fatigue: Secondary | ICD-10-CM | POA: Diagnosis not present

## 2016-07-31 DIAGNOSIS — Z0000 Encounter for general adult medical examination without abnormal findings: Secondary | ICD-10-CM | POA: Diagnosis not present

## 2016-07-31 LAB — CBC WITH DIFFERENTIAL/PLATELET
Basophils Absolute: 0 10*3/uL (ref 0.0–0.1)
Basophils Relative: 0.4 % (ref 0.0–3.0)
Eosinophils Absolute: 0.1 10*3/uL (ref 0.0–0.7)
Eosinophils Relative: 1.1 % (ref 0.0–5.0)
HCT: 35.5 % — ABNORMAL LOW (ref 36.0–46.0)
Hemoglobin: 11.8 g/dL — ABNORMAL LOW (ref 12.0–15.0)
Lymphocytes Relative: 30.9 % (ref 12.0–46.0)
Lymphs Abs: 2.4 10*3/uL (ref 0.7–4.0)
MCHC: 33.3 g/dL (ref 30.0–36.0)
MCV: 82.4 fl (ref 78.0–100.0)
Monocytes Absolute: 0.7 10*3/uL (ref 0.1–1.0)
Monocytes Relative: 9.5 % (ref 3.0–12.0)
Neutro Abs: 4.5 10*3/uL (ref 1.4–7.7)
Neutrophils Relative %: 58.1 % (ref 43.0–77.0)
Platelets: 328 10*3/uL (ref 150.0–400.0)
RBC: 4.3 Mil/uL (ref 3.87–5.11)
RDW: 15 % (ref 11.5–15.5)
WBC: 7.8 10*3/uL (ref 4.0–10.5)

## 2016-07-31 LAB — COMPREHENSIVE METABOLIC PANEL
ALT: 15 U/L (ref 0–35)
AST: 17 U/L (ref 0–37)
Albumin: 4.1 g/dL (ref 3.5–5.2)
Alkaline Phosphatase: 64 U/L (ref 39–117)
BUN: 9 mg/dL (ref 6–23)
CO2: 31 mEq/L (ref 19–32)
Calcium: 9.3 mg/dL (ref 8.4–10.5)
Chloride: 104 mEq/L (ref 96–112)
Creatinine, Ser: 0.79 mg/dL (ref 0.40–1.20)
GFR: 86.81 mL/min (ref >60.00)
Glucose, Bld: 87 mg/dL (ref 70–99)
Potassium: 4.3 mEq/L (ref 3.5–5.1)
Sodium: 139 mEq/L (ref 135–145)
Total Bilirubin: 0.3 mg/dL (ref 0.2–1.2)
Total Protein: 6.8 g/dL (ref 6.0–8.3)

## 2016-07-31 LAB — TSH: TSH: 1.53 u[IU]/mL (ref 0.35–4.50)

## 2016-07-31 MED ORDER — LORCASERIN HCL 10 MG PO TABS: 1.0000 | ORAL_TABLET | Freq: Two times a day (BID) | ORAL | 2 refills | Status: DC

## 2016-07-31 NOTE — Progress Notes (Signed)
Pre visit review using our clinic review tool, if applicable. No additional management support is needed unless otherwise documented below in the visit note. 

## 2016-07-31 NOTE — Progress Notes (Signed)
Patient ID: Tammy Christian, female    DOB: 03-12-1981  Age: 36 y.o. MRN: [id number]  The patient is here for annual preventive  examination and management of other chronic and acute problems.   Normal PAP 2015 by [facility]  baseline mammogram was done May  2015  At age 48  Repeat at age 30  Fasting labs sordered    The risk factors are reflected in the social history.  The roster of all physicians providing medical care to patient - is listed in the Snapshot section of the chart.  Home safety : The patient has smoke detectors in the home. They wear seatbelts.  There are no firearms at home. There is no violence in the home.   There is no risks for hepatitis, STDs or HIV. There is no   history of blood transfusion. They have no travel history to infectious disease endemic areas of the world.  The patient has seen their dentist in the last six month. They have seen their eye doctor in the last year. T    They do not  have excessive sun exposure. Discussed the need for sun protection: hats, long sleeves and use of sunscreen if there is significant sun exposure.   Diet: the importance of a healthy diet is discussed. They do have a healthy diet.  The benefits of regular aerobic exercise were discussed. She does not  exercise regularly  Depression screen: there are no signs or vegative symptoms of depression- irritability, change in appetite, anhedonia, sadness/tearfullness.  The following portions of the patient's history were reviewed and updated as appropriate: allergies, current medications, past family history, past medical history,  past surgical history, past social history  and problem list.  Visual acuity was not assessed per patient preference since she has regular follow up with her ophthalmologist. Hearing and body mass index were assessed and reviewed.   During the course of the visit the patient was educated and counseled about appropriate screening and preventive  services including : fall prevention , diabetes screening, nutrition counseling, colorectal cancer screening, and recommended immunizations.    CC: The primary encounter diagnosis was Fatigue, unspecified type. Diagnoses of Generalized anxiety disorder, Class 3 severe obesity due to excess calories without serious comorbidity with body mass index (BMI) of 40.0 to 44.9 in adult [facility]), and Routine general medical examination at a health care facility were also pertinent to this visit.   Back pain  Surgery planned in July .  alternating betweeen tramadol for daytime pain and oxycodne at night .  Last oxycodone refill was Feb 22 rx'd by [facility] for #40  Still has some left  Last tramadol  Refill May 9  By me. Refill history confirmed via [place] Controlled Substance databas, accessed by me today..  Knee pain improved post surgery  GAD:  Has Weaned off of klonipin. No cymbalta,   Uses   Prn xanax  Obesity : Saxenda not covered by insurance. Alternative meds dicussed.  Marland Kitchen   History Joelene Millin has a past medical history of Allergy; Anxiety; Asthma; Gastritis (2005); Hyperlipidemia; Migraines; Obesity; and Vitamin D deficiency disease.   She has a past surgical history that includes Cholecystectomy (04/08/2012); laprascopic sleeve; Breast biopsy (Right, 02/04/2001); and Knee arthroscopy (Left, 05/01/2016).   Her family history includes Cancer in her maternal grandmother and paternal grandfather; Cancer (age of onset: 24) in her father; Diabetes in her father; Heart disease in her father and paternal grandmother; Hyperlipidemia in her father; Hypertension in her father.She  reports that she has never smoked. She has never used smokeless tobacco. She reports that she drinks about 1.2 oz of alcohol per week . Her drug history is not on file.  Outpatient Medications Prior to Visit  Medication Sig Dispense Refill  . albuterol (PROVENTIL HFA;VENTOLIN HFA) 108 (90 BASE) MCG/ACT inhaler Inhale 2 puffs into the  lungs every 4 (four) hours as needed. 3 Inhaler 1  . alprazolam (XANAX) 2 MG tablet TAKE 1 TABLET BY MOUTH ONCE DAILY AS NEEDED FOR SLEEP 30 tablet 3  . Azelastine-Fluticasone (DYMISTA) 137-50 MCG/ACT SUSP PLACE 1 SPRAY INTO THE NOSE 2 TIMES DAILY AS NEEDED. 69 g 11  . Clindamycin-Benzoyl Per, Refr, gel APPLY TO THE AFFECTED AREA(S) DAILY 45 g 1  . desloratadine (CLARINEX) 5 MG tablet Take 1 tablet (5 mg total) by mouth daily. (Patient taking differently: Take 5 mg by mouth daily as needed. ) 90 tablet 3  . etonogestrel (NEXPLANON) 68 MG IMPL implant 1 each by Subdermal route once.    . famotidine (PEPCID) 20 MG tablet Take 40 mg by mouth at bedtime.    . Insulin Pen Needle (PEN NEEDLES) 31G X 6 MM MISC For use with victoza /saxenda 100 each 0  . metoprolol succinate (TOPROL-XL) 100 MG 24 hr tablet TAKE 1 TABLET BY MOUTH DAILY. TAKE WITH OR IMMEDIATELY FOLLOWING A MEAL. 90 tablet 3  . montelukast (SINGULAIR) 10 MG tablet Take 1 tablet (10 mg total) by mouth at bedtime. (Patient taking differently: Take 10 mg by mouth daily as needed. ) 90 tablet 3  . oxyCODONE-acetaminophen (PERCOCET) 10-325 MG tablet Take 1 tablet by mouth every 6 (six) hours as needed for pain. 60 tablet 0  . pantoprazole (PROTONIX) 40 MG tablet TAKE 1 TABLET (40 MG TOTAL) BY MOUTH DAILY. 90 tablet 1  . SUMAtriptan (IMITREX) 100 MG tablet Take 1 tablet (100 mg total) by mouth daily as needed. 10 tablet 5  . traMADol (ULTRAM) 50 MG tablet TAKE 2 TABLETS BY MOUTH EVERY 4 HOURS AS NEEDED 120 tablet 3  . Vitamin D, Ergocalciferol, (DRISDOL) 50000 units CAPS capsule TAKE 1 CAPSULE BY MOUTH EVERY 7 DAYS 12 capsule 3  . clonazePAM (KLONOPIN) 0.5 MG tablet Take 1 tablet (0.5 mg total) by mouth at bedtime. (Patient not taking: Reported on 07/31/2016) 30 tablet 1  . DULoxetine (CYMBALTA) 30 MG capsule Take 1 capsule (30 mg total) by mouth daily. (Patient not taking: Reported on 07/31/2016) 30 capsule 1  . Liraglutide -Weight Management  (SAXENDA) 18 MG/3ML SOPN Inject 0.6 mg into the skin daily. Increase dose weekly as follows: Week 2: 1.2 mg daily ; Week 3: 1.8 mg daily; Week 4: 2.4 mg daily (Patient not taking: Reported on 07/31/2016) 9 mL 0  . oxyCODONE (ROXICODONE) 5 MG immediate release tablet Take 1 tablet (5 mg total) by mouth every 4 (four) hours as needed for severe pain. (Patient not taking: Reported on 07/31/2016) 35 tablet 0   No facility-administered medications prior to visit.     Review of Systems   Patient denies headache, fevers, malaise, unintentional weight loss, skin rash, eye pain, sinus congestion and sinus pain, sore throat, dysphagia,  hemoptysis , cough, dyspnea, wheezing, chest pain, palpitations, orthopnea, edema, abdominal pain, nausea, melena, diarrhea, constipation, flank pain, dysuria, hematuria, urinary  Frequency, nocturia, numbness, tingling, seizures,  Focal weakness, Loss of consciousness,  Tremor, insomnia, depression, anxiety, and suicidal ideation.      Objective:  BP 112/82 (BP Location: Left Arm, Patient Position: Sitting,  Cuff Size: Large)   Pulse 65   Temp 98.6 F (37 C) (Oral)   Resp 16   Ht 5\' 7"  (1.702 m)   Wt 254 lb 3.2 oz (115.3 kg)   SpO2 98%   BMI 39.81 kg/m   Physical Exam   General appearance: alert, cooperative and appears stated age Ears: normal TM's and external ear canals both ears Throat: lips, mucosa, and tongue normal; teeth and gums normal Neck: no adenopathy, no carotid bruit, supple, symmetrical, trachea midline and thyroid not enlarged, symmetric, no tenderness/mass/nodules Back: symmetric, no curvature. ROM normal. No CVA tenderness. Lungs: clear to auscultation bilaterally Heart: regular rate and rhythm, S1, S2 normal, no murmur, click, rub or gallop Abdomen: soft, non-tender; bowel sounds normal; no masses,  no organomegaly Pulses: 2+ and symmetric Skin: Skin color, texture, turgor normal. No rashes or lesions Lymph nodes: Cervical, supraclavicular,  and axillary nodes normal.    Assessment & Plan:   Problem List Items Addressed This Visit    Routine general medical examination at a health care facility    Annual comprehensive preventive exam was done as well as an evaluation and management of chronic conditions .  During the course of the visit the patient was educated and counseled about appropriate screening and preventive services including :  diabetes screening, lipid analysis with projected  10 year  risk for CAD , nutrition counseling, breast, cervical and colorectal cancer screening, and recommended immunizations.  Printed recommendations for health maintenance screenings was given.  Lab Results  Component Value Date   CHOL 209 (H) 07/12/2015   HDL 52.10 07/12/2015   LDLCALC 132 (H) 07/12/2015   TRIG 121.0 07/12/2015   CHOLHDL 4 07/12/2015         Obesity    S/p gastric sleeve remotely with weight regain. I have addressed  BMI and recommended a low glycemic index diet utilizing smaller more frequent meals to increase metabolism.  I have also recommended that patient start exercising with a goal of 30 minutes of aerobic exercise a minimum of 5 days per week. Screening for lipid disorders, thyroid and diabetes to be done today.  Lab Results  Component Value Date   TSH 1.53 07/31/2016   Lab Results  Component Value Date   CHOL 209 (H) 07/12/2015   HDL 52.10 07/12/2015   LDLCALC 132 (H) 07/12/2015   TRIG 121.0 07/12/2015   CHOLHDL 4 07/12/2015   Lab Results  Component Value Date   HGBA1C 5.4 04/29/2014         Relevant Medications   Lorcaserin HCl 10 MG TABS   Generalized anxiety disorder    Now managed with prn alprazolam  klonipin weaned off ,       Other Visit Diagnoses    Fatigue, unspecified type    -  Primary   Relevant Orders   Comprehensive metabolic panel (Completed)   TSH (Completed)   CBC with Differential/Platelet (Completed)      I have discontinued Ms. Venard's oxyCODONE, clonazePAM,  DULoxetine, and Liraglutide -Weight Management. I am also having her start on Lorcaserin HCl. Additionally, I am having her maintain her etonogestrel, albuterol, desloratadine, montelukast, SUMAtriptan, pantoprazole, Azelastine-Fluticasone, traMADol, famotidine, Clindamycin-Benzoyl Per (Refr), alprazolam, Pen Needles, Vitamin D (Ergocalciferol), metoprolol succinate, and oxyCODONE-acetaminophen.  Meds ordered this encounter  Medications  . Lorcaserin HCl 10 MG TABS    Sig: Take 1 tablet by mouth 2 (two) times daily.    Dispense:  60 tablet    Refill:  2  Medications Discontinued During This Encounter  Medication Reason  . clonazePAM (KLONOPIN) 0.5 MG tablet Patient has not taken in last 30 days  . DULoxetine (CYMBALTA) 30 MG capsule Patient has not taken in last 30 days  . Liraglutide -Weight Management (SAXENDA) 18 MG/3ML SOPN Not covered by the pt's insurance  . oxyCODONE (ROXICODONE) 5 MG immediate release tablet Patient has not taken in last 30 days    Follow-up: No Follow-up on file.   Crecencio Mc, MD

## 2016-08-02 NOTE — Assessment & Plan Note (Signed)
S/p gastric sleeve remotely with weight regain. I have addressed  BMI and recommended a low glycemic index diet utilizing smaller more frequent meals to increase metabolism.  I have also recommended that patient start exercising with a goal of 30 minutes of aerobic exercise a minimum of 5 days per week. Screening for lipid disorders, thyroid and diabetes to be done today.  Lab Results  Component Value Date   TSH 1.53 07/31/2016   Lab Results  Component Value Date   CHOL 209 (H) 07/12/2015   HDL 52.10 07/12/2015   LDLCALC 132 (H) 07/12/2015   TRIG 121.0 07/12/2015   CHOLHDL 4 07/12/2015   Lab Results  Component Value Date   HGBA1C 5.4 04/29/2014

## 2016-08-02 NOTE — Assessment & Plan Note (Signed)
Annual comprehensive preventive exam was done as well as an evaluation and management of chronic conditions .  During the course of the visit the patient was educated and counseled about appropriate screening and preventive services including :  diabetes screening, lipid analysis with projected  10 year  risk for CAD , nutrition counseling, breast, cervical and colorectal cancer screening, and recommended immunizations.  Printed recommendations for health maintenance screenings was given.  Lab Results  Component Value Date   CHOL 209 (H) 07/12/2015   HDL 52.10 07/12/2015   LDLCALC 132 (H) 07/12/2015   TRIG 121.0 07/12/2015   CHOLHDL 4 07/12/2015

## 2016-08-02 NOTE — Assessment & Plan Note (Signed)
Now managed with prn alprazolam  klonipin weaned off ,

## 2016-08-03 ENCOUNTER — Encounter: Payer: Self-pay | Admitting: Internal Medicine

## 2016-08-05 ENCOUNTER — Other Ambulatory Visit: Payer: Self-pay | Admitting: Internal Medicine

## 2016-08-07 ENCOUNTER — Other Ambulatory Visit: Payer: Self-pay | Admitting: Internal Medicine

## 2016-08-08 ENCOUNTER — Encounter: Payer: Self-pay | Admitting: Internal Medicine

## 2016-08-08 MED ORDER — OXYCODONE-ACETAMINOPHEN 10-325 MG PO TABS: 1.0000 | ORAL_TABLET | Freq: Four times a day (QID) | ORAL | 0 refills | Status: DC | PRN

## 2016-08-08 NOTE — Telephone Encounter (Signed)
Refilled: 07/10/2016 Last OV: 07/31/2016 Next OV: not scheduled

## 2016-08-08 NOTE — Telephone Encounter (Signed)
Oxycodone refilled.   An pick up tomorrow

## 2016-08-10 ENCOUNTER — Other Ambulatory Visit: Payer: Self-pay | Admitting: Internal Medicine

## 2016-08-11 NOTE — Telephone Encounter (Signed)
rx has been picked up

## 2016-08-17 ENCOUNTER — Telehealth: Payer: Self-pay | Admitting: *Deleted

## 2016-08-17 NOTE — Telephone Encounter (Signed)
[facility] requested a call with additional clinical questions in reference to the medication Velviq  Contact [phone] Ref # [id number]

## 2016-08-18 NOTE — Telephone Encounter (Signed)
Additional information is being completed and faxed by Juliann Pulse.

## 2016-08-20 ENCOUNTER — Encounter: Payer: Self-pay | Admitting: Internal Medicine

## 2016-08-22 ENCOUNTER — Encounter: Payer: Self-pay | Admitting: Internal Medicine

## 2016-08-23 NOTE — Telephone Encounter (Signed)
PA for Belviq has bee approved from 08/22/2016-11/21/2016. Pt and pharmacy has been notified.   PA reference #: [id number]

## 2016-08-24 ENCOUNTER — Telehealth: Payer: Self-pay | Admitting: Internal Medicine

## 2016-08-24 NOTE — Telephone Encounter (Signed)
Please save 4:30 on Friday for patient.

## 2016-08-24 NOTE — Telephone Encounter (Signed)
On the schedule. thanks 

## 2016-08-25 ENCOUNTER — Ambulatory Visit (INDEPENDENT_AMBULATORY_CARE_PROVIDER_SITE_OTHER): Payer: 59 | Admitting: Internal Medicine

## 2016-08-25 ENCOUNTER — Encounter: Payer: Self-pay | Admitting: Internal Medicine

## 2016-08-25 VITALS — BP 102/76 | HR 79 | Temp 98.0°F | Resp 16 | Ht 67.0 in | Wt 259.0 lb

## 2016-08-25 DIAGNOSIS — M539 Dorsopathy, unspecified: Secondary | ICD-10-CM

## 2016-08-25 DIAGNOSIS — M5387 Other specified dorsopathies, lumbosacral region: Secondary | ICD-10-CM

## 2016-08-25 DIAGNOSIS — M5432 Sciatica, left side: Secondary | ICD-10-CM

## 2016-08-25 DIAGNOSIS — I890 Lymphedema, not elsewhere classified: Secondary | ICD-10-CM

## 2016-08-25 DIAGNOSIS — F411 Generalized anxiety disorder: Secondary | ICD-10-CM

## 2016-08-25 MED ORDER — OXYCODONE HCL ER 15 MG PO T12A: 15.0000 mg | EXTENDED_RELEASE_TABLET | Freq: Two times a day (BID) | ORAL | 0 refills | Status: DC

## 2016-08-25 MED ORDER — OXYCODONE HCL 15 MG PO TABS: 15.0000 mg | ORAL_TABLET | Freq: Four times a day (QID) | ORAL | 0 refills | Status: DC | PRN

## 2016-08-25 MED ORDER — METHYLPREDNISOLONE ACETATE 80 MG/ML IJ SUSP
80.0000 mg | Freq: Once | INTRAMUSCULAR | Status: AC
  Administered 2016-08-25: 80 mg via INTRAMUSCULAR

## 2016-08-25 NOTE — Progress Notes (Addendum)
Subjective:  Patient ID: Tammy Christian, female    DOB: 08-04-1980  Age: 36 y.o. MRN: [id number]  CC: The primary encounter diagnosis was Sciatica of left side. Diagnoses of Generalized anxiety disorder, Sciatica of left side associated with disorder of lumbosacral spine, and Lymphedema of both lower extremities were also pertinent to this visit.  HPI JOHNSIE Christian presents for management of low back pain with sciatica  secondary to herniated disk ,  She has surgery scheduled for July 9 and has been in constant pain but working without opioids on the days she has procedures.  She is in tears most days without the pain .  She has been using the Percocet 10 mg dose every 4 hours due to her level of pain and is wondering if a longer acting opioid can be prescribed.    She denies constipation .  Using Lyrica as well, which she notes relieves the burning quality of the pain better than the gabapentin and with fewer side effects      Outpatient Medications Prior to Visit  Medication Sig Dispense Refill  . albuterol (PROVENTIL HFA;VENTOLIN HFA) 108 (90 BASE) MCG/ACT inhaler Inhale 2 puffs into the lungs every 4 (four) hours as needed. (Patient taking differently: Inhale 2 puffs into the lungs every 4 (four) hours as needed for wheezing or shortness of breath. ) 3 Inhaler 1  . alprazolam (XANAX) 2 MG tablet TAKE 1 TABLET BY MOUTH ONCE DAILY AS NEEDED FOR SLEEP (Patient taking differently: TAKE 1 TABLET BY MOUTH ONCE DAILY AS NEEDED FOR ANXIETY) 30 tablet 3  . Azelastine-Fluticasone (DYMISTA) 137-50 MCG/ACT SUSP PLACE 1 SPRAY INTO THE NOSE 2 TIMES DAILY AS NEEDED. (Patient taking differently: Place 1 spray into the nose daily as needed. ) 69 g 11  . Clindamycin-Benzoyl Per, Refr, gel APPLY TO THE AFFECTED AREA(S) DAILY 45 g 1  . etonogestrel (NEXPLANON) 68 MG IMPL implant 1 each by Subdermal route once.    . famotidine (PEPCID) 20 MG tablet Take 40 mg by mouth at bedtime.    . Insulin Pen  Needle (PEN NEEDLES) 31G X 6 MM MISC For use with victoza /saxenda (Patient not taking: Reported on 09/13/2016) 100 each 0  . Lorcaserin HCl 10 MG TABS Take 1 tablet by mouth 2 (two) times daily. (Patient taking differently: Take 1 tablet by mouth 2 (two) times daily. Only takes it 2-3 times weekly) 60 tablet 2  . metoprolol succinate (TOPROL-XL) 100 MG 24 hr tablet TAKE 1 TABLET BY MOUTH DAILY. TAKE WITH OR IMMEDIATELY FOLLOWING A MEAL. 90 tablet 3  . pantoprazole (PROTONIX) 40 MG tablet TAKE 1 TABLET (40 MG TOTAL) BY MOUTH DAILY. 90 tablet 1  . SUMAtriptan (IMITREX) 100 MG tablet TAKE 1 TABLET (100 MG TOTAL) BY MOUTH DAILY AS NEEDED. (Patient taking differently: Take 100 mg by mouth daily as needed for migraine. ) 10 tablet 5  . Vitamin D, Ergocalciferol, (DRISDOL) 50000 units CAPS capsule TAKE 1 CAPSULE BY MOUTH EVERY 7 DAYS (Patient taking differently: TAKE 1 CAPSULE BY MOUTH EVERY 7 DAYS ON SUNDAYS) 12 capsule 3  . desloratadine (CLARINEX) 5 MG tablet Take 1 tablet (5 mg total) by mouth daily. (Patient taking differently: Take 5 mg by mouth daily as needed. ) 90 tablet 3  . montelukast (SINGULAIR) 10 MG tablet Take 1 tablet (10 mg total) by mouth at bedtime. (Patient taking differently: Take 10 mg by mouth daily as needed. ) 90 tablet 3  . oxyCODONE-acetaminophen (PERCOCET) 10-325 MG  tablet Take 1 tablet by mouth every 6 (six) hours as needed for pain. 60 tablet 0  . traMADol (ULTRAM) 50 MG tablet TAKE 2 TABLETS BY MOUTH EVERY 4 HOURS AS NEEDED (Patient not taking: Reported on 09/13/2016) 120 tablet 3   No facility-administered medications prior to visit.     Review of Systems;  Patient denies headache, fevers, malaise, unintentional weight loss, skin rash, eye pain, sinus congestion and sinus pain, sore throat, dysphagia,  hemoptysis , cough, dyspnea, wheezing, chest pain, palpitations, orthopnea, edema, abdominal pain, nausea, melena, diarrhea, constipation, flank pain, dysuria, hematuria,  urinary  Frequency, nocturia, numbness, tingling, seizures,  Focal weakness, Loss of consciousness,  Tremor, insomnia, depression, anxiety, and suicidal ideation.      Objective:  BP 102/76 (BP Location: Left Arm, Patient Position: Sitting, Cuff Size: Large)   Pulse 79   Temp 98 F (36.7 C) (Oral)   Resp 16   Ht 5\' 7"  (1.702 m)   Wt 259 lb (117.5 kg)   SpO2 99%   BMI 40.57 kg/m   BP Readings from Last 3 Encounters:  08/25/16 102/76  07/31/16 112/82  05/01/16 129/70    Wt Readings from Last 3 Encounters:  08/25/16 259 lb (117.5 kg)  07/31/16 254 lb 3.2 oz (115.3 kg)  05/01/16 240 lb (108.9 kg)    General appearance: alert, cooperative and appears stated age Ears: normal TM's and external ear canals both ears Throat: lips, mucosa, and tongue normal; teeth and gums normal Neck: no adenopathy, no carotid bruit, supple, symmetrical, trachea midline and thyroid not enlarged, symmetric, no tenderness/mass/nodules Back: symmetric, no curvature. ROM normal. No CVA tenderness. Lungs: clear to auscultation bilaterally Heart: regular rate and rhythm, S1, S2 normal, no murmur, click, rub or gallop Abdomen: soft, non-tender; bowel sounds normal; no masses,  no organomegaly Pulses: 2+ and symmetric Skin: Skin color, texture,  normal. Bilteral LE edema nonpitting . No rashes or lesions Lymph nodes: Cervical, supraclavicular, and axillary nodes normal.  Lab Results  Component Value Date   HGBA1C 5.4 04/29/2014    Lab Results  Component Value Date   CREATININE 0.72 09/18/2016   CREATININE 0.79 07/31/2016   CREATININE 0.71 04/11/2016    Lab Results  Component Value Date   WBC 7.3 09/18/2016   HGB 11.4 (L) 09/18/2016   HCT 36.6 09/18/2016   PLT 309 09/18/2016   GLUCOSE 100 (H) 09/18/2016   CHOL 209 (H) 07/12/2015   TRIG 121.0 07/12/2015   HDL 52.10 07/12/2015   LDLCALC 132 (H) 07/12/2015   ALT 15 07/31/2016   AST 17 07/31/2016   NA 141 09/18/2016   K 4.1 09/18/2016   CL  108 09/18/2016   CREATININE 0.72 09/18/2016   BUN 11 09/18/2016   CO2 25 09/18/2016   TSH 1.53 07/31/2016   HGBA1C 5.4 04/29/2014    No results found.  Assessment & Plan:   Problem List Items Addressed This Visit    Generalized anxiety disorder    Now managed without benzodiazepines. patient self weaned       Sciatica of left side associated with disorder of lumbosacral spine    She has not had any ER visits  And has not requested any early refills until now.   Her Refill history was confirmed via [place] Controlled Substance database by me today during her visit and there have been no prescriptions of controlled substances filled from any providers other than me. . Adding oxycodone ER 15 mg bid.  Continue Percocet 10/325 10  mg every 4 hours if needed  For the next week, ,  But new rx will be for oxycodone 15 mg for use every 6 hours if needed for breakthrough pain       Relevant Medications   pregabalin (LYRICA) 150 MG capsule   Lymphedema of leg    Despite conservative treatment including wearing medical grade one compression, elevation and remaining active the patient still presents with stage one lymphedema.  I am recommending use of a lymphedema pump.        Other Visit Diagnoses    Sciatica of left side    -  Primary   Relevant Medications   pregabalin (LYRICA) 150 MG capsule   methylPREDNISolone acetate (DEPO-MEDROL) injection 80 mg (Completed)    A total of 25 minutes of face to face time was spent with patient more than half of which was spent in counselling about the above mentioned conditions  and coordination of care   I have discontinued Ms. Chilcote's oxyCODONE-acetaminophen. I have also changed her oxyCODONE. Additionally, I am having her start on oxyCODONE. Lastly, I am having her maintain her etonogestrel, albuterol, Azelastine-Fluticasone, famotidine, Clindamycin-Benzoyl Per (Refr), alprazolam, Pen Needles, Vitamin D (Ergocalciferol), metoprolol succinate, Lorcaserin  HCl, pantoprazole, SUMAtriptan, pregabalin, and meloxicam. We administered methylPREDNISolone acetate.  Meds ordered this encounter  Medications  . pregabalin (LYRICA) 150 MG capsule    Sig: Take 150 mg by mouth 3 (three) times daily.  . meloxicam (MOBIC) 15 MG tablet    Sig: Take 15 mg by mouth daily.     Refill:  3  . DISCONTD: oxyCODONE (ROXICODONE) 15 MG immediate release tablet    Sig: Take 1 tablet (15 mg total) by mouth every 6 (six) hours as needed for pain. May refill on or after September 08 2016    Dispense:  120 tablet    Refill:  0  . oxyCODONE (OXYCONTIN) 15 mg 12 hr tablet    Sig: Take 1 tablet (15 mg total) by mouth every 12 (twelve) hours.    Dispense:  60 tablet    Refill:  0  . oxyCODONE (ROXICODONE) 15 MG immediate release tablet    Sig: Take 1 tablet (15 mg total) by mouth every 6 (six) hours as needed for pain. May refill on or after August 29 2016    Dispense:  120 tablet    Refill:  0  . methylPREDNISolone acetate (DEPO-MEDROL) injection 80 mg    Medications Discontinued During This Encounter  Medication Reason  . oxyCODONE-acetaminophen (PERCOCET) 10-325 MG tablet   . oxyCODONE (ROXICODONE) 15 MG immediate release tablet Reorder    Follow-up: No Follow-up on file.   Crecencio Mc, MD

## 2016-08-25 NOTE — Patient Instructions (Addendum)
I am adding 15 mg Oxycodone ER twice daily for better control of pain  Continue using the Percocet 10 mg every 4 to 6 hours  Until next week, then you can fill the oxycodone 15 mg  Every 6 hours as needed  (no tylenol )

## 2016-08-27 NOTE — Assessment & Plan Note (Addendum)
She has not had any ER visits  And has not requested any early refills until now.   Her Refill history was confirmed via [place] Controlled Substance database by me today during her visit and there have been no prescriptions of controlled substances filled from any providers other than me. . Adding oxycodone ER 15 mg bid.  Continue Percocet 10/325 10 mg every 4 hours if needed  For the next week, ,  But new rx will be for oxycodone 15 mg for use every 6 hours if needed for breakthrough pain

## 2016-08-27 NOTE — Assessment & Plan Note (Signed)
Now managed without benzodiazepines. patient self weaned

## 2016-09-18 ENCOUNTER — Other Ambulatory Visit: Payer: Self-pay

## 2016-09-18 ENCOUNTER — Encounter (HOSPITAL_COMMUNITY)
Admission: RE | Admit: 2016-09-18 | Discharge: 2016-09-18 | Disposition: A | Discharge status: Home / Self Care | Payer: 59 | Source: Ambulatory Visit | Attending: Neurosurgery | Admitting: Neurosurgery

## 2016-09-18 ENCOUNTER — Other Ambulatory Visit: Payer: Self-pay | Admitting: Internal Medicine

## 2016-09-18 ENCOUNTER — Encounter (HOSPITAL_COMMUNITY): Payer: Self-pay

## 2016-09-18 DIAGNOSIS — Z01812 Encounter for preprocedural laboratory examination: Secondary | ICD-10-CM | POA: Insufficient documentation

## 2016-09-18 HISTORY — DX: Unspecified osteoarthritis, unspecified site: M19.90

## 2016-09-18 LAB — TYPE AND SCREEN
ABO/RH(D): A POS
Antibody Screen: NEGATIVE

## 2016-09-18 LAB — SURGICAL PCR SCREEN
MRSA, PCR: NEGATIVE
Staphylococcus aureus: NEGATIVE

## 2016-09-18 LAB — CBC WITH DIFFERENTIAL/PLATELET
Basophils Absolute: 0 10*3/uL (ref 0.0–0.1)
Basophils Relative: 0 %
Eosinophils Absolute: 0.2 10*3/uL (ref 0.0–0.7)
Eosinophils Relative: 3 %
HCT: 36.6 % (ref 36.0–46.0)
Hemoglobin: 11.4 g/dL — ABNORMAL LOW (ref 12.0–15.0)
Lymphocytes Relative: 41 %
Lymphs Abs: 2.9 10*3/uL (ref 0.7–4.0)
MCH: 26.1 pg (ref 26.0–34.0)
MCHC: 31.1 g/dL (ref 30.0–36.0)
MCV: 83.8 fL (ref 78.0–100.0)
Monocytes Absolute: 0.6 10*3/uL (ref 0.1–1.0)
Monocytes Relative: 9 %
Neutro Abs: 3.5 10*3/uL (ref 1.7–7.7)
Neutrophils Relative %: 47 %
Platelets: 309 10*3/uL (ref 150–400)
RBC: 4.37 MIL/uL (ref 3.87–5.11)
RDW: 14.4 % (ref 11.5–15.5)
WBC: 7.3 10*3/uL (ref 4.0–10.5)

## 2016-09-18 LAB — ABO/RH: ABO/RH(D): A POS

## 2016-09-18 LAB — BASIC METABOLIC PANEL
Anion gap: 8 (ref 5–15)
BUN: 11 mg/dL (ref 6–20)
CO2: 25 mmol/L (ref 22–32)
Calcium: 9 mg/dL (ref 8.9–10.3)
Chloride: 108 mmol/L (ref 101–111)
Creatinine, Ser: 0.72 mg/dL (ref 0.44–1.00)
GFR calc Af Amer: >60 mL/min (ref >60)
GFR calc non Af Amer: >60 mL/min (ref >60)
Glucose, Bld: 100 mg/dL — ABNORMAL HIGH (ref 65–99)
Potassium: 4.1 mmol/L (ref 3.5–5.1)
Sodium: 141 mmol/L (ref 135–145)

## 2016-09-18 MED ORDER — CHLORHEXIDINE GLUCONATE CLOTH 2 % EX PADS: 6.0000 | MEDICATED_PAD | Freq: Once | CUTANEOUS | Status: DC

## 2016-09-18 NOTE — Patient Outreach (Signed)
[facility]) Care Management  09/18/2016  MAISON AGRUSA 11/13/1980 [id number]  Subjective: Telephone call to patient. Discussed UMR pre-op call. Patient voices understanding and agrees to pre-op call.    Objective: Per chart review, patient with radiculopathy, lumbar region scheduled for procedure on 09/25/2016.  Assessment: Received UMR pre-op referral on 09/11/16. Pre-Op call completed.   Re: FMLA- Ms. Kalt states she has completed the [employer] paperwork. RNCM encouraged client to have a copy for her records.   She reports she did not choose short-term disability as part of her benefits package. She reports she will be out of work at least two months and asked about long term disability. RNCM encouraged client to call benefits service office (contact number provided) for additional benefit information. She is not sure if she selected the [facility] Indemnity plan benefit and will follow up with benefits office. RNCM also reinforced that she could check online also.   Post procedure equipment/home health- RNCM reinforced that inpatient case manager will arrange any home health or equipment needs post procedure prior to discharge.   RNCM discussed [place] benefit is higher when using a [facility] facility/pharmacy. RNCM reinforced that if she is discharged home on the weekend with a new prescription, she will have to use a local pharmacy due to [facility] pharmacies are not open on the weekend.     Support:  Client reports that she has someone to assist with pharmacy needs at discharge. Client reports she has transportation to her follow up appointment.     Discussed Advanced Directives. RNCM reinforced that Spiritual care department is available to assist [employer] employees with Advanced Directives as needed.  No other medical issues identified and no additional community resource information needs at this time.   Patient is agreeable to follow up post procedure  call.  Plan: telephonic RNCM will follow up post discharge within 3 business days of notification.  Thea Silversmith, RN, MSN, [place] Coordinator Cell: [phone]

## 2016-09-18 NOTE — Pre-Procedure Instructions (Signed)
    Tammy Christian  09/18/2016      [address], [place] [address] Phone: [phone] Fax: [phone]  [id number] [place], [address] [place] [address] Phone: [phone] Fax: [phone]    Your procedure is scheduled on 09/25/16.  Report to [facility] Admitting at 6 A.M.  Call this number if you have problems the morning of surgery:  [phone]   Remember:  Do not eat food or drink liquids after midnight.  Take these medicines the morning of surgery with A SIP OF WATER --all  Inhalers,xanax,metoprolol,oxycodone,protonix   Do not wear jewelry, make-up or nail polish.  Do not wear lotions, powders, or perfumes, or deoderant.  Do not shave 48 hours prior to surgery.  Men may shave face and neck.  Do not bring valuables to the hospital.  [facility] is not responsible for any belongings or valuables.  Contacts, dentures or bridgework may not be worn into surgery.  Leave your suitcase in the car.  After surgery it may be brought to your room.  For patients admitted to the hospital, discharge time will be determined by your treatment team.  Patients discharged the day of surgery will not be allowed to drive home.   Name and phone number of your driver:   Special instructions:  Do not take any aspirin,anti-inflammatories,vitamins,or herbal supplements 5-7 days prior to surgery.  Please read over the following fact sheets that you were given. MRSA Information

## 2016-09-19 ENCOUNTER — Other Ambulatory Visit: Payer: Self-pay | Admitting: Internal Medicine

## 2016-09-20 ENCOUNTER — Encounter: Payer: Self-pay | Admitting: Internal Medicine

## 2016-09-21 DIAGNOSIS — I890 Lymphedema, not elsewhere classified: Secondary | ICD-10-CM | POA: Insufficient documentation

## 2016-09-21 MED ORDER — OXYCODONE HCL 15 MG PO TABS: 15.0000 mg | ORAL_TABLET | Freq: Four times a day (QID) | ORAL | 0 refills | Status: DC | PRN

## 2016-09-21 MED ORDER — OXYCODONE HCL ER 15 MG PO T12A: 15.0000 mg | EXTENDED_RELEASE_TABLET | Freq: Two times a day (BID) | ORAL | 0 refills | Status: DC

## 2016-09-21 NOTE — Assessment & Plan Note (Signed)
Despite conservative treatment including wearing medical grade one compression, elevation and remaining active the patient still presents with stage one lymphedema.  I am recommending use of a lymphedema pump.

## 2016-09-25 ENCOUNTER — Encounter (HOSPITAL_COMMUNITY)
Admission: RE | Disposition: A | Discharge status: Home / Self Care | Payer: Self-pay | Source: Ambulatory Visit | Attending: Neurosurgery

## 2016-09-25 ENCOUNTER — Inpatient Hospital Stay (HOSPITAL_COMMUNITY): Payer: 59 | Admitting: Critical Care Medicine

## 2016-09-25 ENCOUNTER — Encounter (HOSPITAL_COMMUNITY): Payer: Self-pay

## 2016-09-25 ENCOUNTER — Inpatient Hospital Stay (HOSPITAL_BASED_OUTPATIENT_CLINIC_OR_DEPARTMENT_OTHER)
Admission: RE | Admit: 2016-09-25 | Discharge: 2016-09-26 | DRG: 454 | Disposition: A | Discharge status: Home / Self Care | Payer: 59 | Source: Ambulatory Visit | Attending: Neurosurgery | Admitting: Neurosurgery

## 2016-09-25 ENCOUNTER — Inpatient Hospital Stay (HOSPITAL_COMMUNITY): Payer: 59

## 2016-09-25 DIAGNOSIS — F419 Anxiety disorder, unspecified: Secondary | ICD-10-CM | POA: Diagnosis present

## 2016-09-25 DIAGNOSIS — Z6841 Body Mass Index (BMI) 40.0 and over, adult: Secondary | ICD-10-CM | POA: Diagnosis not present

## 2016-09-25 DIAGNOSIS — M48061 Spinal stenosis, lumbar region without neurogenic claudication: Secondary | ICD-10-CM | POA: Diagnosis not present

## 2016-09-25 DIAGNOSIS — Z419 Encounter for procedure for purposes other than remedying health state, unspecified: Secondary | ICD-10-CM

## 2016-09-25 DIAGNOSIS — E559 Vitamin D deficiency, unspecified: Secondary | ICD-10-CM | POA: Diagnosis present

## 2016-09-25 DIAGNOSIS — M4326 Fusion of spine, lumbar region: Secondary | ICD-10-CM | POA: Diagnosis not present

## 2016-09-25 DIAGNOSIS — J45909 Unspecified asthma, uncomplicated: Secondary | ICD-10-CM | POA: Diagnosis present

## 2016-09-25 DIAGNOSIS — Z79899 Other long term (current) drug therapy: Secondary | ICD-10-CM | POA: Diagnosis not present

## 2016-09-25 DIAGNOSIS — Z8249 Family history of ischemic heart disease and other diseases of the circulatory system: Secondary | ICD-10-CM

## 2016-09-25 DIAGNOSIS — M5116 Intervertebral disc disorders with radiculopathy, lumbar region: Secondary | ICD-10-CM | POA: Diagnosis present

## 2016-09-25 DIAGNOSIS — M1990 Unspecified osteoarthritis, unspecified site: Secondary | ICD-10-CM | POA: Diagnosis present

## 2016-09-25 DIAGNOSIS — Z791 Long term (current) use of non-steroidal anti-inflammatories (NSAID): Secondary | ICD-10-CM

## 2016-09-25 DIAGNOSIS — M5127 Other intervertebral disc displacement, lumbosacral region: Secondary | ICD-10-CM | POA: Diagnosis present

## 2016-09-25 DIAGNOSIS — M5136 Other intervertebral disc degeneration, lumbar region: Secondary | ICD-10-CM | POA: Diagnosis present

## 2016-09-25 DIAGNOSIS — Z8349 Family history of other endocrine, nutritional and metabolic diseases: Secondary | ICD-10-CM

## 2016-09-25 DIAGNOSIS — F411 Generalized anxiety disorder: Secondary | ICD-10-CM | POA: Diagnosis not present

## 2016-09-25 DIAGNOSIS — M4317 Spondylolisthesis, lumbosacral region: Secondary | ICD-10-CM | POA: Diagnosis present

## 2016-09-25 DIAGNOSIS — Z833 Family history of diabetes mellitus: Secondary | ICD-10-CM

## 2016-09-25 DIAGNOSIS — M4807 Spinal stenosis, lumbosacral region: Secondary | ICD-10-CM | POA: Diagnosis not present

## 2016-09-25 DIAGNOSIS — M5126 Other intervertebral disc displacement, lumbar region: Secondary | ICD-10-CM | POA: Diagnosis present

## 2016-09-25 HISTORY — PX: SPINAL FUSION: SHX223

## 2016-09-25 LAB — I-STAT BETA HCG BLOOD, ED (NOT ORDERABLE): I-stat hCG, quantitative: <5 m[IU]/mL (ref <5)

## 2016-09-25 SURGERY — POSTERIOR LUMBAR FUSION 2 LEVEL
Anesthesia: General | Site: Spine Lumbar

## 2016-09-25 MED ORDER — DIAZEPAM 5 MG PO TABS
ORAL_TABLET | ORAL | Status: AC
  Filled 2016-09-25: qty 1

## 2016-09-25 MED ORDER — FENTANYL CITRATE (PF) 100 MCG/2ML IJ SOLN
INTRAMUSCULAR | Status: DC | PRN
  Administered 2016-09-25 (×2): 50 ug via INTRAVENOUS
  Administered 2016-09-25: 150 ug via INTRAVENOUS
  Administered 2016-09-25 (×2): 25 ug via INTRAVENOUS
  Administered 2016-09-25 (×3): 50 ug via INTRAVENOUS

## 2016-09-25 MED ORDER — MENTHOL 3 MG MT LOZG: 1.0000 | LOZENGE | OROMUCOSAL | Status: DC | PRN

## 2016-09-25 MED ORDER — MELOXICAM 7.5 MG PO TABS
15.0000 mg | ORAL_TABLET | Freq: Every day | ORAL | Status: DC
  Administered 2016-09-26: 15 mg via ORAL
  Filled 2016-09-25 (×2): qty 2

## 2016-09-25 MED ORDER — ROCURONIUM BROMIDE 100 MG/10ML IV SOLN
INTRAVENOUS | Status: DC | PRN
  Administered 2016-09-25: 10 mg via INTRAVENOUS
  Administered 2016-09-25: 5 mg via INTRAVENOUS
  Administered 2016-09-25: 10 mg via INTRAVENOUS
  Administered 2016-09-25: 30 mg via INTRAVENOUS
  Administered 2016-09-25 (×2): 10 mg via INTRAVENOUS
  Administered 2016-09-25: 50 mg via INTRAVENOUS

## 2016-09-25 MED ORDER — LORATADINE 10 MG PO TABS
10.0000 mg | ORAL_TABLET | Freq: Every day | ORAL | Status: DC
  Administered 2016-09-25 – 2016-09-26 (×2): 10 mg via ORAL
  Filled 2016-09-25 (×2): qty 1

## 2016-09-25 MED ORDER — OXYCODONE HCL 5 MG PO TABS
15.0000 mg | ORAL_TABLET | ORAL | Status: DC | PRN
  Administered 2016-09-25 – 2016-09-26 (×3): 15 mg via ORAL
  Filled 2016-09-25 (×2): qty 3

## 2016-09-25 MED ORDER — ONDANSETRON HCL 4 MG/2ML IJ SOLN
INTRAMUSCULAR | Status: DC | PRN
  Administered 2016-09-25: 4 mg via INTRAVENOUS

## 2016-09-25 MED ORDER — FENTANYL CITRATE (PF) 250 MCG/5ML IJ SOLN
INTRAMUSCULAR | Status: AC
  Filled 2016-09-25: qty 5

## 2016-09-25 MED ORDER — LIDOCAINE HCL (CARDIAC) 20 MG/ML IV SOLN
INTRAVENOUS | Status: DC | PRN
  Administered 2016-09-25: 100 mg via INTRAVENOUS

## 2016-09-25 MED ORDER — SUGAMMADEX SODIUM 500 MG/5ML IV SOLN
INTRAVENOUS | Status: DC | PRN
  Administered 2016-09-25: 240 mg via INTRAVENOUS

## 2016-09-25 MED ORDER — HYDROMORPHONE HCL 1 MG/ML IJ SOLN
0.5000 mg | INTRAMUSCULAR | Status: DC | PRN
  Administered 2016-09-25 (×2): 1 mg via INTRAVENOUS
  Filled 2016-09-25 (×2): qty 1

## 2016-09-25 MED ORDER — MONTELUKAST SODIUM 10 MG PO TABS
10.0000 mg | ORAL_TABLET | Freq: Every day | ORAL | Status: DC
  Administered 2016-09-25: 10 mg via ORAL
  Filled 2016-09-25 (×2): qty 1

## 2016-09-25 MED ORDER — CEFAZOLIN SODIUM-DEXTROSE 1-4 GM/50ML-% IV SOLN
1.0000 g | Freq: Three times a day (TID) | INTRAVENOUS | Status: AC
  Administered 2016-09-25 – 2016-09-26 (×2): 1 g via INTRAVENOUS
  Filled 2016-09-25 (×2): qty 50

## 2016-09-25 MED ORDER — METOPROLOL SUCCINATE ER 25 MG PO TB24
50.0000 mg | ORAL_TABLET | Freq: Every day | ORAL | Status: DC
  Administered 2016-09-26: 50 mg via ORAL
  Filled 2016-09-25: qty 2

## 2016-09-25 MED ORDER — DIAZEPAM 5 MG PO TABS
5.0000 mg | ORAL_TABLET | Freq: Four times a day (QID) | ORAL | Status: DC | PRN
  Administered 2016-09-25: 5 mg via ORAL
  Administered 2016-09-25: 10 mg via ORAL
  Administered 2016-09-26: 5 mg via ORAL
  Administered 2016-09-26: 10 mg via ORAL
  Filled 2016-09-25 (×2): qty 2
  Filled 2016-09-25: qty 1

## 2016-09-25 MED ORDER — ONDANSETRON HCL 4 MG/2ML IJ SOLN: 4.0000 mg | Freq: Once | INTRAMUSCULAR | Status: DC | PRN

## 2016-09-25 MED ORDER — 0.9 % SODIUM CHLORIDE (POUR BTL) OPTIME
TOPICAL | Status: DC | PRN
  Administered 2016-09-25: 1000 mL

## 2016-09-25 MED ORDER — PHENOL 1.4 % MT LIQD: 1.0000 | OROMUCOSAL | Status: DC | PRN

## 2016-09-25 MED ORDER — PROPOFOL 10 MG/ML IV BOLUS
INTRAVENOUS | Status: DC | PRN
  Administered 2016-09-25: 120 mg via INTRAVENOUS

## 2016-09-25 MED ORDER — SUMATRIPTAN SUCCINATE 50 MG PO TABS: 100.0000 mg | ORAL_TABLET | Freq: Every day | ORAL | Status: DC | PRN

## 2016-09-25 MED ORDER — MIDAZOLAM HCL 5 MG/5ML IJ SOLN
INTRAMUSCULAR | Status: DC | PRN
  Administered 2016-09-25: 2 mg via INTRAVENOUS

## 2016-09-25 MED ORDER — HYDROCODONE-ACETAMINOPHEN 10-325 MG PO TABS
1.0000 | ORAL_TABLET | ORAL | Status: DC | PRN
  Administered 2016-09-25 – 2016-09-26 (×3): 2 via ORAL
  Filled 2016-09-25 (×4): qty 2

## 2016-09-25 MED ORDER — THROMBIN 20000 UNITS EX SOLR
CUTANEOUS | Status: AC
  Filled 2016-09-25: qty 20000

## 2016-09-25 MED ORDER — MIDAZOLAM HCL 2 MG/2ML IJ SOLN
INTRAMUSCULAR | Status: AC
  Filled 2016-09-25: qty 2

## 2016-09-25 MED ORDER — MEPERIDINE HCL 25 MG/ML IJ SOLN
INTRAMUSCULAR | Status: AC
  Filled 2016-09-25: qty 1

## 2016-09-25 MED ORDER — THROMBIN 20000 UNITS EX SOLR
CUTANEOUS | Status: DC | PRN
  Administered 2016-09-25 (×2): via TOPICAL

## 2016-09-25 MED ORDER — PROPOFOL 10 MG/ML IV BOLUS
INTRAVENOUS | Status: AC
  Filled 2016-09-25: qty 20

## 2016-09-25 MED ORDER — PREGABALIN 75 MG PO CAPS
150.0000 mg | ORAL_CAPSULE | Freq: Three times a day (TID) | ORAL | Status: DC
  Administered 2016-09-25 – 2016-09-26 (×3): 150 mg via ORAL
  Filled 2016-09-25 (×3): qty 2

## 2016-09-25 MED ORDER — FAMOTIDINE 20 MG PO TABS
40.0000 mg | ORAL_TABLET | Freq: Every day | ORAL | Status: DC
  Administered 2016-09-25: 40 mg via ORAL
  Filled 2016-09-25: qty 2

## 2016-09-25 MED ORDER — MEPERIDINE HCL 25 MG/ML IJ SOLN
6.2500 mg | INTRAMUSCULAR | Status: DC | PRN
  Administered 2016-09-25: 12.5 mg via INTRAVENOUS

## 2016-09-25 MED ORDER — LIDOCAINE HCL (CARDIAC) 20 MG/ML IV SOLN
INTRAVENOUS | Status: AC
  Filled 2016-09-25: qty 5

## 2016-09-25 MED ORDER — INSULIN ASPART 100 UNIT/ML ~~LOC~~ SOLN: 0.0000 [IU] | Freq: Three times a day (TID) | SUBCUTANEOUS | Status: DC

## 2016-09-25 MED ORDER — ONDANSETRON HCL 4 MG/2ML IJ SOLN: 4.0000 mg | Freq: Four times a day (QID) | INTRAMUSCULAR | Status: DC | PRN

## 2016-09-25 MED ORDER — BUPIVACAINE HCL (PF) 0.25 % IJ SOLN
INTRAMUSCULAR | Status: AC
  Filled 2016-09-25: qty 30

## 2016-09-25 MED ORDER — VANCOMYCIN HCL 1000 MG IV SOLR
INTRAVENOUS | Status: DC | PRN
  Administered 2016-09-25: 1000 mg via TOPICAL

## 2016-09-25 MED ORDER — ONDANSETRON HCL 4 MG PO TABS: 4.0000 mg | ORAL_TABLET | Freq: Four times a day (QID) | ORAL | Status: DC | PRN

## 2016-09-25 MED ORDER — SODIUM CHLORIDE 0.9% FLUSH: 3.0000 mL | Freq: Two times a day (BID) | INTRAVENOUS | Status: DC

## 2016-09-25 MED ORDER — ARTIFICIAL TEARS OPHTHALMIC OINT
TOPICAL_OINTMENT | OPHTHALMIC | Status: AC
  Filled 2016-09-25: qty 3.5

## 2016-09-25 MED ORDER — ROCURONIUM BROMIDE 50 MG/5ML IV SOLN
INTRAVENOUS | Status: AC
  Filled 2016-09-25: qty 3

## 2016-09-25 MED ORDER — LACTATED RINGERS IV SOLN
INTRAVENOUS | Status: DC | PRN
  Administered 2016-09-25 (×3): via INTRAVENOUS

## 2016-09-25 MED ORDER — BUPIVACAINE HCL (PF) 0.25 % IJ SOLN
INTRAMUSCULAR | Status: DC | PRN
  Administered 2016-09-25: 30 mL

## 2016-09-25 MED ORDER — VANCOMYCIN HCL 1000 MG IV SOLR
INTRAVENOUS | Status: AC
  Filled 2016-09-25: qty 1000

## 2016-09-25 MED ORDER — DEXAMETHASONE SODIUM PHOSPHATE 10 MG/ML IJ SOLN
INTRAMUSCULAR | Status: AC
  Filled 2016-09-25: qty 1

## 2016-09-25 MED ORDER — LORCASERIN HCL 10 MG PO TABS: 1.0000 | ORAL_TABLET | Freq: Two times a day (BID) | ORAL | Status: DC

## 2016-09-25 MED ORDER — ALBUTEROL SULFATE (2.5 MG/3ML) 0.083% IN NEBU: 3.0000 mL | INHALATION_SOLUTION | RESPIRATORY_TRACT | Status: DC | PRN

## 2016-09-25 MED ORDER — DEXAMETHASONE SODIUM PHOSPHATE 10 MG/ML IJ SOLN
INTRAMUSCULAR | Status: DC | PRN
  Administered 2016-09-25: 10 mg via INTRAVENOUS

## 2016-09-25 MED ORDER — HYDROMORPHONE HCL 1 MG/ML IJ SOLN
INTRAMUSCULAR | Status: AC
  Filled 2016-09-25: qty 1

## 2016-09-25 MED ORDER — SODIUM CHLORIDE 0.9 % IR SOLN
Status: DC | PRN
  Administered 2016-09-25: 09:00:00

## 2016-09-25 MED ORDER — CEFAZOLIN SODIUM-DEXTROSE 2-3 GM-% IV SOLR
INTRAVENOUS | Status: DC | PRN
  Administered 2016-09-25: 2 g via INTRAVENOUS

## 2016-09-25 MED ORDER — OXYCODONE HCL ER 15 MG PO T12A
15.0000 mg | EXTENDED_RELEASE_TABLET | Freq: Two times a day (BID) | ORAL | Status: DC
  Administered 2016-09-25 – 2016-09-26 (×3): 15 mg via ORAL
  Filled 2016-09-25 (×4): qty 1

## 2016-09-25 MED ORDER — PANTOPRAZOLE SODIUM 40 MG PO TBEC
40.0000 mg | DELAYED_RELEASE_TABLET | Freq: Every day | ORAL | Status: DC
  Administered 2016-09-26: 40 mg via ORAL
  Filled 2016-09-25: qty 1

## 2016-09-25 MED ORDER — ARTIFICIAL TEARS OPHTHALMIC OINT
TOPICAL_OINTMENT | OPHTHALMIC | Status: DC | PRN
  Administered 2016-09-25: 1 via OPHTHALMIC

## 2016-09-25 MED ORDER — SODIUM CHLORIDE 0.9 % IV SOLN: 250.0000 mL | INTRAVENOUS | Status: DC

## 2016-09-25 MED ORDER — CEFAZOLIN SODIUM-DEXTROSE 2-4 GM/100ML-% IV SOLN
INTRAVENOUS | Status: AC
  Filled 2016-09-25: qty 100

## 2016-09-25 MED ORDER — SODIUM CHLORIDE 0.9% FLUSH: 3.0000 mL | INTRAVENOUS | Status: DC | PRN

## 2016-09-25 MED ORDER — HYDROMORPHONE HCL 1 MG/ML IJ SOLN
0.2500 mg | INTRAMUSCULAR | Status: DC | PRN
  Administered 2016-09-25 (×4): 0.5 mg via INTRAVENOUS

## 2016-09-25 MED ORDER — OXYCODONE HCL 5 MG PO TABS
ORAL_TABLET | ORAL | Status: AC
Start: 2016-09-25 — End: 2016-09-26
  Filled 2016-09-25: qty 3

## 2016-09-25 SURGICAL SUPPLY — 62 items
BAG DECANTER FOR FLEXI CONT (MISCELLANEOUS) ×2 IMPLANT
BENZOIN TINCTURE PRP APPL 2/3 (GAUZE/BANDAGES/DRESSINGS) ×2 IMPLANT
BUR CUTTER 7.0 ROUND (BURR) IMPLANT
BUR MATCHSTICK NEURO 3.0 LAGG (BURR) ×2 IMPLANT
CANISTER SUCT 3000ML PPV (MISCELLANEOUS) ×2 IMPLANT
CAP LCK SPNE (Orthopedic Implant) ×6 IMPLANT
CAP LOCK SPINE RADIUS (Orthopedic Implant) ×6 IMPLANT
CAP LOCKING (Orthopedic Implant) ×6 IMPLANT
CARTRIDGE OIL MAESTRO DRILL (MISCELLANEOUS) ×1 IMPLANT
CONT SPEC 4OZ CLIKSEAL STRL BL (MISCELLANEOUS) ×2 IMPLANT
COVER BACK TABLE 60X90IN (DRAPES) ×2 IMPLANT
DERMABOND ADVANCED (GAUZE/BANDAGES/DRESSINGS) ×1
DERMABOND ADVANCED .7 DNX12 (GAUZE/BANDAGES/DRESSINGS) ×1 IMPLANT
DEVICE INTERBODY ELEVATE 10X23 (Cage) ×4 IMPLANT
DEVICE INTERBODY ELEVATE 23X8 (Cage) ×2 IMPLANT
DIFFUSER DRILL AIR PNEUMATIC (MISCELLANEOUS) ×2 IMPLANT
DRAPE C-ARM 42X72 X-RAY (DRAPES) ×4 IMPLANT
DRAPE HALF SHEET 40X57 (DRAPES) ×2 IMPLANT
DRAPE LAPAROTOMY 100X72X124 (DRAPES) ×2 IMPLANT
DRAPE POUCH INSTRU U-SHP 10X18 (DRAPES) ×2 IMPLANT
DRAPE SURG 17X23 STRL (DRAPES) ×8 IMPLANT
DRSG OPSITE 4X5.5 SM (GAUZE/BANDAGES/DRESSINGS) ×2 IMPLANT
DRSG OPSITE POSTOP 4X8 (GAUZE/BANDAGES/DRESSINGS) ×2 IMPLANT
DURAPREP 26ML APPLICATOR (WOUND CARE) ×2 IMPLANT
ELECT REM PT RETURN 9FT ADLT (ELECTROSURGICAL) ×2
ELECTRODE REM PT RTRN 9FT ADLT (ELECTROSURGICAL) ×1 IMPLANT
EVACUATOR 1/8 PVC DRAIN (DRAIN) IMPLANT
GAUZE SPONGE 4X4 12PLY STRL (GAUZE/BANDAGES/DRESSINGS) ×2 IMPLANT
GAUZE SPONGE 4X4 16PLY XRAY LF (GAUZE/BANDAGES/DRESSINGS) IMPLANT
GLOVE BIO SURGEON STRL SZ8 (GLOVE) ×2 IMPLANT
GLOVE BIOGEL M 8.0 STRL (GLOVE) ×2 IMPLANT
GLOVE BIOGEL PI IND STRL 7.5 (GLOVE) ×5 IMPLANT
GLOVE BIOGEL PI INDICATOR 7.5 (GLOVE) ×5
GLOVE ECLIPSE 9.0 STRL (GLOVE) ×4 IMPLANT
GLOVE SS N UNI LF 7.0 STRL (GLOVE) ×8 IMPLANT
GOWN STRL REUS W/ TWL LRG LVL3 (GOWN DISPOSABLE) ×2 IMPLANT
GOWN STRL REUS W/ TWL XL LVL3 (GOWN DISPOSABLE) ×3 IMPLANT
GOWN STRL REUS W/TWL 2XL LVL3 (GOWN DISPOSABLE) IMPLANT
GOWN STRL REUS W/TWL LRG LVL3 (GOWN DISPOSABLE) ×2
GOWN STRL REUS W/TWL XL LVL3 (GOWN DISPOSABLE) ×3
KIT BASIN OR (CUSTOM PROCEDURE TRAY) ×2 IMPLANT
KIT ROOM TURNOVER OR (KITS) ×2 IMPLANT
MILL MEDIUM DISP (BLADE) ×2 IMPLANT
NEEDLE HYPO 22GX1.5 SAFETY (NEEDLE) ×2 IMPLANT
NS IRRIG 1000ML POUR BTL (IV SOLUTION) ×2 IMPLANT
OIL CARTRIDGE MAESTRO DRILL (MISCELLANEOUS) ×2
PACK LAMINECTOMY NEURO (CUSTOM PROCEDURE TRAY) ×2 IMPLANT
ROD 5.5X60MM PURPLE (Rod) ×4 IMPLANT
SCREW 5.75X40M (Screw) ×8 IMPLANT
SCREW 5.75X45MM (Screw) ×4 IMPLANT
SPACER SPNL STD 23X8XSTRL (Cage) ×2 IMPLANT
SPCR SPNL STD 23X8XSTRL (Cage) ×2 IMPLANT
SPONGE SURGIFOAM ABS GEL 100 (HEMOSTASIS) ×4 IMPLANT
STRIP CLOSURE SKIN 1/2X4 (GAUZE/BANDAGES/DRESSINGS) ×2 IMPLANT
SUT VIC AB 0 CT1 18XCR BRD8 (SUTURE) ×1 IMPLANT
SUT VIC AB 0 CT1 8-18 (SUTURE) ×1
SUT VIC AB 2-0 CT1 18 (SUTURE) ×2 IMPLANT
SUT VIC AB 3-0 SH 8-18 (SUTURE) ×2 IMPLANT
TOWEL GREEN STERILE (TOWEL DISPOSABLE) ×2 IMPLANT
TOWEL GREEN STERILE FF (TOWEL DISPOSABLE) ×2 IMPLANT
TRAY FOLEY W/METER SILVER 16FR (SET/KITS/TRAYS/PACK) ×2 IMPLANT
WATER STERILE IRR 1000ML POUR (IV SOLUTION) ×2 IMPLANT

## 2016-09-25 NOTE — Transfer of Care (Signed)
Immediate Anesthesia Transfer of Care Note  Patient: Tammy Christian  Procedure(s) Performed: Procedure(s): LUMBAR FOUR-FIVE, LUMBAR FIVE-SACRAL ONE POSTERIOR LUMBAR INTERBODY FUSION (N/A)  Patient Location: PACU  Anesthesia Type:General  Level of Consciousness: awake, alert  and oriented  Airway & Oxygen Therapy: Patient Spontanous Breathing and Patient connected to face mask oxygen  Post-op Assessment: Report given to RN, Post -op Vital signs reviewed and stable and Patient moving all extremities X 4  Post vital signs: Reviewed and stable  Last Vitals:  Vitals:   09/25/16 0624 09/25/16 1201  BP: 130/68 (!) (P) 106/59  Pulse: 99   Resp: 20 (P) 13  Temp: 37.2 C    HR 95, BP 106/59, RR 16, sats 100% Last Pain:  Vitals:   09/25/16 0624  TempSrc: Oral         Complications: No apparent anesthesia complications

## 2016-09-25 NOTE — Anesthesia Postprocedure Evaluation (Signed)
Anesthesia Post Note  Patient: Tammy Christian  Procedure(s) Performed: Procedure(s) (LRB): LUMBAR FOUR-FIVE, LUMBAR FIVE-SACRAL ONE POSTERIOR LUMBAR INTERBODY FUSION (N/A)     Anesthesia Post Evaluation  Last Vitals:  Vitals:   09/25/16 1300 09/25/16 1307  BP: 105/61   Pulse: 85 84  Resp: 10 (!) 9  Temp:      Last Pain:  Vitals:   09/25/16 1307  TempSrc:   PainSc: [place] DAVID

## 2016-09-25 NOTE — Evaluation (Signed)
Physical Therapy Evaluation Patient Details Name: Tammy Christian MRN: [id number] DOB: 1980/11/15 Today's Date: 09/25/2016   History of Present Illness  Patient is a 36 y/o female who presents s/p L4-5, L5-S1 PLIF. PMH includes asthma, HLD, obesity, anxiety.  Clinical Impression  Patient presents with pain and post surgical deficits s/p above surgery. Tolerated gait training with Min A progressing to Min guard assist for safety. Education re: back precautions, log roll technique, mobility progression and handout. Mother reports she will provide assist at d/c. 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 Pt will need RW at d/c. Will follow acutely to maximize independence and mobility prior to return home.     Follow Up Recommendations Home health PT;Supervision for mobility/OOB    Equipment Recommendations  Rolling walker with 5" wheels    Recommendations for Other Services OT consult     Precautions / Restrictions Precautions Precautions: Back Precaution Booklet Issued: Yes (comment) Precaution Comments: Provided handout and precautions. Required Braces or Orthoses: Spinal Brace Spinal Brace: Lumbar corset Restrictions Weight Bearing Restrictions: No      Mobility  Bed Mobility Overal bed mobility: Needs Assistance Bed Mobility: Rolling;Sidelying to Sit Rolling: Min guard Sidelying to sit: Min guard;HOB elevated       General bed mobility comments: Cues for log roll technique; use of rail for support,.   Transfers Overall transfer level: Needs assistance Equipment used: Rolling walker (2 wheeled);None Transfers: Sit to/from Stand Sit to Stand: Min assist;Min guard         General transfer comment: Min A initially to power to standing with cues for anterior translation; second time stood with min guard using RW.   Ambulation/Gait Ambulation/Gait assistance: Min guard;Min assist Ambulation Distance (Feet): 100 Feet Assistive device: Rolling walker (2  wheeled) Gait Pattern/deviations: Step-through pattern;Decreased stride length;Trunk flexed Gait velocity: decreased Gait velocity interpretation: Below normal speed for age/gender General Gait Details: Slow, unsteady gait requiring min-Mod A reaching for wall and handheld assist for support; tried RW and balance improved. CUes for upright.  Stairs            Wheelchair Mobility    Modified Rankin (Stroke Patients Only)       Balance Overall balance assessment: Needs assistance Sitting-balance support: Feet supported;No upper extremity supported Sitting balance-Leahy Scale: Fair Sitting balance - Comments: Assist to donn brace sitting EOB.   Standing balance support: During functional activity;Bilateral upper extremity supported Standing balance-Leahy Scale: Poor Standing balance comment: Reliant on BUEs for support in standing using RW.                              Pertinent Vitals/Pain Pain Assessment: Faces Faces Pain Scale: Hurts whole lot Pain Location: back and LEs with movement Pain Descriptors / Indicators: Sore;Grimacing;Operative site guarding;Pins and needles Pain Intervention(s): Monitored during session;Repositioned;Limited activity within patient's tolerance    Home Living Family/patient expects to be discharged to:: Private residence Living Arrangements: Alone Available Help at Discharge: Family;Available 24 hours/day Type of Home: House Home Access: Level entry     Home Layout: One level Home Equipment: None      Prior Function Level of Independence: Independent         Comments: Works as a Oncologist at [employer].     Hand Dominance        Extremity/Trunk Assessment   Upper Extremity Assessment Upper Extremity Assessment: Defer to OT evaluation    Lower Extremity Assessment Lower Extremity  Assessment: RLE deficits/detail;Generalized weakness RLE Deficits / Details: pins and needles sensation RLE Sensation: decreased  light touch    Cervical / Trunk Assessment Cervical / Trunk Assessment: Other exceptions Cervical / Trunk Exceptions: s/p spine surgery  Communication   Communication: No difficulties  Cognition Arousal/Alertness: Awake/alert Behavior During Therapy: WFL for tasks assessed/performed Overall Cognitive Status: Within Functional Limits for tasks assessed                                        General Comments General comments (skin integrity, edema, etc.): Mother present during session.    Exercises     Assessment/Plan    PT Assessment Patient needs continued PT services  PT Problem List Decreased strength;Decreased mobility;Obesity;Decreased knowledge of precautions;Pain;Impaired sensation;Decreased balance;Decreased activity tolerance;Decreased knowledge of use of DME       PT Treatment Interventions Therapeutic activities;Gait training;DME instruction;Therapeutic exercise;Patient/family education;Balance training;Functional mobility training    PT Goals (Current goals can be found in the Care Plan section)  Acute Rehab PT Goals Patient Stated Goal: decrease pain PT Goal Formulation: With patient Time For Goal Achievement: 10/09/16 Potential to Achieve Goals: Good    Frequency Min 5X/week   Barriers to discharge        Co-evaluation               AM-PAC PT "6 Clicks" Daily Activity  Outcome Measure Difficulty turning over in bed (including adjusting bedclothes, sheets and blankets)?: None Difficulty moving from lying on back to sitting on the side of the bed? : None Difficulty sitting down on and standing up from a chair with arms (e.g., wheelchair, bedside commode, etc,.)?: Total Help needed moving to and from a bed to chair (including a wheelchair)?: A Little Help needed walking in hospital room?: A Little Help needed climbing 3-5 steps with a railing? : A Lot 6 Click Score: 17    End of Session Equipment Utilized During Treatment: Back  brace Activity Tolerance: Patient limited by pain Patient left: Other (comment) (in bathroom with tech) Nurse Communication: Mobility status PT Visit Diagnosis: Unsteadiness on feet (R26.81);Pain;Difficulty in walking, not elsewhere classified (R26.2) Pain - Right/Left: Left Pain - part of body: Leg (back)    Time: 5427-0623 PT Time Calculation (min) (ACUTE ONLY): 26 min   Charges:   PT Evaluation $PT Eval Low Complexity: 1 Procedure PT Treatments $Gait Training: 8-22 mins   PT G Codes:        Wray Kearns, PT, DPT [phone]    Marguarite Arbour A Mikeila Burgen 09/25/2016, 3:57 PM

## 2016-09-25 NOTE — Op Note (Signed)
Date of procedure: 09/25/2016  Date of dictation: Same  Service: Neurosurgery  Preoperative diagnosis: L4-5 central herniated nucleus pulposus, L5-S1 grade 2 lytic spondylolisthesis with foraminal stenosis  Postoperative diagnosis: Same  Procedure Name: L5 Gill procedure with bilateral L5 and S1 decompressive foraminotomies, more than would be required for simple interbody fusion alone.  Bilateral L4-5 decompressive laminotomies with foraminotomies  L4-5, L5-S1 posterior lumbar interbody fusion utilizing interbody cages and locally harvested autograft  L4-5 S1 posterior lateral arthrodesis utilizing segmental pedicle screw fixation and local autograft  Surgeon:Mikaya Bunner A.Stephaun Million, M.D.  Asst. Surgeon: Saintclair Halsted  Anesthesia: General  Indication: 36 year old female with intractable back and lower extremity pain failing conservative management. Workup demonstrates evidence of a grade 2 L5-S1 lytic spondylolisthesis with severe disc space collapse and marked foraminal stenosis. Patient also with disc degeneration and central disc herniation at L4-5. She presents now for two-level lumbar decompression and fusion in hopes of improving her symptoms.  Operative note: After induction of anesthesia, patient position prone onto Wilson frame and appropriately padded. Lumbar region prepped and draped sterilely. Incision made from L4-S1. Dissection performed bilaterally. Retractors placed. Fluoroscopy used. Levels confirmed. Gill type procedures was then performed by removing the entire lamina and inferior facets of L5 bilaterally. Ligament flavum was elevated and resected. Residual inferior process from the pedicle was resected and foraminotomies were completed on course exiting L5 nerve roots bilaterally inaccessible would be required for interbody fusion alone. Foraminotomies also completed on course exiting S1 nerve roots. Bilateral decompressive laminotomies and foraminotomies at L4-5 then performed by removing  the inferior two thirds the lamina of L4 the pars and articularis and inferior facet of L4 bilaterally and the majority the superior facet of L5 bilaterally. Ligament flavum elevated and resected. Foraminotomies completed. Bilateral discectomies and performed at L4-5 and L5-S1. Disc spaces and prepared for interbody fusion. With distractors left the patient's right side disc space further scraped and cleaned on the left at both L4-5 and L5-S1. Soft tissue removed and interspace. At L4-5 on the left a 10 mm [employer] expandable cage packed with locally harvested autograft was then impacted into place and expanded to its full extent. Distractor was removed patient's right side. Disc space prepared on the right side. Morselized autograft packed in the interspace. A second cage then impacted into place and expanded to its full extent. Procedure then repeated at L5-S1 using 8 mm standard expandable implants and locally harvested autograft. Pedicles of L4-L5 and S1 were then identified using surface landmarks and intraoperative fluoroscopy. Superficial bone The pedicle was then removed using high-speed drill. Each pedicles and probed using pedicle awl each pedicle awl track was then probed and found to be solidly within the bone. Each pedicle awl track was then tapped with a screw tap. Each screw tap hole was probed and found to be solidly than the bone. 5.75 mm radius brand screws from [employer] were placed bilaterally at L4-L5 and S1. Final images revealed good position the cages and the implants of the proper upper level with improved alignment of spine. Wounds and irrigated MI solution. Gelfoam was placed topically for hemostasis. Transverse processes and residual facet and sacroiliac decorticated. Morselize autograft packed posterior laterally for later fusion. Short segment titanium rods and contoured and placed or the screw heads from L4-S1. Locking caps placed over the screws were locking caps and engaged  with the construct under compression. Vancomycin powder was placed the deep wound space. Wounds and close in layers with Vicryl sutures. Steri-Strips and sterile dressing were  applied. No apparent complications. Patient tolerated the procedure well and she returns to the recovery room postop.

## 2016-09-25 NOTE — Anesthesia Preprocedure Evaluation (Addendum)
Anesthesia Evaluation  Patient identified by MRN, date of birth, ID band Patient awake    Reviewed: Allergy & Precautions, NPO status , Patient's Chart, lab work & pertinent test results, reviewed documented beta blocker date and time   Airway Mallampati: II  TM Distance: >3 FB Neck ROM: Full    Dental  (+) Dental Advisory Given, Teeth Intact   Pulmonary asthma ,    Pulmonary exam normal        Cardiovascular Normal cardiovascular exam     Neuro/Psych  Headaches, Anxiety    GI/Hepatic   Endo/Other  Morbid obesity  Renal/GU      Musculoskeletal  (+) Arthritis ,   Abdominal   Peds  Hematology   Anesthesia Other Findings   Reproductive/Obstetrics                            Anesthesia Physical Anesthesia Plan  ASA: II  Anesthesia Plan: General   Post-op Pain Management:    Induction: Intravenous  PONV Risk Score and Plan: 3 and Ondansetron, Dexamethasone, Propofol and Midazolam  Airway Management Planned: Oral ETT  Additional Equipment:   Intra-op Plan:   Post-operative Plan: Extubation in OR  Informed Consent: I have reviewed the patients History and Physical, chart, labs and discussed the procedure including the risks, benefits and alternatives for the proposed anesthesia with the patient or authorized representative who has indicated his/her understanding and acceptance.   Dental advisory given  Plan Discussed with: Surgeon  Anesthesia Plan Comments:        Anesthesia Quick Evaluation

## 2016-09-25 NOTE — H&P (Signed)
Tammy Christian is an 36 y.o. female.   Chief Complaint: Back pain HPI: 36 year old female with chronic and progressive lower back pain with bilateral radicular symptoms failing conservative management. Workup demonstrates evidence of a grade 2 L5-S1 lytic spondylolisthesis with marked foraminal collapse and stenosis as well as a central L4-5 disc herniation with some degree of inferior migration causing some compression of thecal sac and left L5 nerve root. Patient has failed conservative management presents now for operative decompression and fusion in hopes of improving her symptoms.  Past Medical History:  Diagnosis Date  . Allergy   . Anxiety    panic disorder  . Arthritis   . Asthma   . Gastritis 2005  . Hyperlipidemia   . Migraines   . Obesity   . Vitamin D deficiency disease     Past Surgical History:  Procedure Laterality Date  . BREAST BIOPSY Right 02/04/2001   Negative c Clip  . CHOLECYSTECTOMY  04/08/2012  . KNEE ARTHROSCOPY Left 05/01/2016   Procedure: ARTHROSCOPY KNEE LATERAL RELEASE;  Surgeon: Hessie Knows, MD;  Location: [facility] ORS;  Service: Orthopedics;  Laterality: Left;  . laprascopic sleeve      Family History  Problem Relation Age of Onset  . Cancer Maternal Grandmother   . Heart disease Paternal Grandmother   . Cancer Paternal Grandfather   . Diabetes Father   . Heart disease Father        atrial fib  . Hyperlipidemia Father   . Hypertension Father   . Cancer Father 86       colon polyps   Social History:  reports that she has never smoked. She has never used smokeless tobacco. She reports that she drinks about 1.2 oz of alcohol per week . She reports that she does not use drugs.  Allergies:  Allergies  Allergen Reactions  . Other Hives and Rash    dermabond    Medications Prior to Admission  Medication Sig Dispense Refill  . alprazolam (XANAX) 2 MG tablet TAKE 1 TABLET BY MOUTH ONCE DAILY AS NEEDED FOR SLEEP (Patient taking differently:  TAKE 1 TABLET BY MOUTH ONCE DAILY AS NEEDED FOR ANXIETY) 30 tablet 3  . Azelastine-Fluticasone (DYMISTA) 137-50 MCG/ACT SUSP PLACE 1 SPRAY INTO THE NOSE 2 TIMES DAILY AS NEEDED. (Patient taking differently: Place 1 spray into the nose daily as needed. ) 69 g 11  . Clindamycin-Benzoyl Per, Refr, gel APPLY TO THE AFFECTED AREA(S) DAILY 45 g 1  . desloratadine (CLARINEX) 5 MG tablet TAKE 1 TABLET (5 MG TOTAL) BY MOUTH DAILY. 90 tablet 3  . etonogestrel (NEXPLANON) 68 MG IMPL implant 1 each by Subdermal route once.    . famotidine (PEPCID) 20 MG tablet Take 40 mg by mouth at bedtime.    . meloxicam (MOBIC) 15 MG tablet Take 15 mg by mouth daily.   3  . metoprolol succinate (TOPROL-XL) 100 MG 24 hr tablet TAKE 1 TABLET BY MOUTH DAILY. TAKE WITH OR IMMEDIATELY FOLLOWING A MEAL. 90 tablet 3  . montelukast (SINGULAIR) 10 MG tablet TAKE 1 TABLET (10 MG TOTAL) BY MOUTH AT BEDTIME. 90 tablet 3  . Multiple Vitamins-Minerals (ADULT GUMMY PO) Take 2 tablets by mouth daily.    . pantoprazole (PROTONIX) 40 MG tablet TAKE 1 TABLET (40 MG TOTAL) BY MOUTH DAILY. 90 tablet 1  . pregabalin (LYRICA) 150 MG capsule Take 150 mg by mouth 3 (three) times daily.    . SUMAtriptan (IMITREX) 100 MG tablet TAKE 1 TABLET (100  MG TOTAL) BY MOUTH DAILY AS NEEDED. (Patient taking differently: Take 100 mg by mouth daily as needed for migraine. ) 10 tablet 5  . Vitamin D, Ergocalciferol, (DRISDOL) 50000 units CAPS capsule TAKE 1 CAPSULE BY MOUTH EVERY 7 DAYS (Patient taking differently: TAKE 1 CAPSULE BY MOUTH EVERY 7 DAYS ON SUNDAYS) 12 capsule 3  . albuterol (PROVENTIL HFA;VENTOLIN HFA) 108 (90 BASE) MCG/ACT inhaler Inhale 2 puffs into the lungs every 4 (four) hours as needed. (Patient taking differently: Inhale 2 puffs into the lungs every 4 (four) hours as needed for wheezing or shortness of breath. ) 3 Inhaler 1  . Clindamycin-Benzoyl Per, Refr, gel APPLY TO THE AFFECTED AREA(S) DAILY 45 g 1  . Insulin Pen Needle (PEN NEEDLES) 31G  X 6 MM MISC For use with victoza /saxenda (Patient not taking: Reported on 09/13/2016) 100 each 0  . Lorcaserin HCl 10 MG TABS Take 1 tablet by mouth 2 (two) times daily. (Patient taking differently: Take 1 tablet by mouth 2 (two) times daily. Only takes it 2-3 times weekly) 60 tablet 2  . oxyCODONE (OXYCONTIN) 15 mg 12 hr tablet Take 1 tablet (15 mg total) by mouth every 12 (twelve) hours. 60 tablet 0  . oxyCODONE (ROXICODONE) 15 MG immediate release tablet Take 1 tablet (15 mg total) by mouth every 6 (six) hours as needed for pain. May refill on or after September 28 2016 120 tablet 0    Results for orders placed or performed during the hospital encounter of 09/25/16 (from the past 48 hour(s))  I-Stat beta hCG blood, ED     Status: None   Collection Time: 09/25/16  7:18 AM  Result Value Ref Range   I-stat hCG, quantitative <5.0 <5 mIU/mL   Comment 3            Comment:   GEST. AGE      CONC.  (mIU/mL)   <=1 WEEK        5 - 50     2 WEEKS       50 - 500     3 WEEKS       10 0 - 10,000     4 WEEKS     1,000 - 30,000        FEMALE AND NON-PREGNANT FEMALE:     LESS THAN 5 mIU/mL    No results found.  Pertinent items noted in HPI and remainder of comprehensive ROS otherwise negative.  Blood pressure 130/68, pulse 99, temperature 99 F (37.2 C), temperature source Oral, resp. rate 20, weight 117 kg (258 lb), last menstrual period 06/26/2016, SpO2 100 %.  Patient is awake and alert. She is oriented and appropriate. Cranial nerve function is intact. Motor examination 5/5 bilaterally sensory examination with mild decreased sensation to light touch in her left L5 dermatome. Deep tendon refluxes normal active except for Achilles reflexes are absent bilaterally. Gait antalgic. Posture flexed. Examination head ears eyes nose and throat is unremarkable. Chest and abdomen are benign. Extremities are free from injury deformity. Assessment/Plan L4-5 degenerative disc disease with herniation and  radiculopathy, L5-S1 grade 2 lytic spondylolisthesis with marked foraminal stenosis. Plan bilateral L4-5 and L5-S1 decompressive surgery followed by posterior lumbar interbody fusion utilizing interbody cages and locally harvested autograft coupled with posterior lateral arthrodesis utilizing segmental pedicle screw fixation and local autografting. Risks metaphyseal been explained. Patient wishes to proceed.  Glessie Eustice A 09/25/2016, 7:50 AM

## 2016-09-25 NOTE — Consult Note (Signed)
   [facility] CM Inpatient Consult   09/25/2016  HARLEI LEHRMANN Nov 30, 1980 [id number]    Went to bedside to speak with Ms. Whipple on behalf of [place] [facility] program for [employer] employees/dependents with [employer].  However, she was having personal care at the time of visit. Left Link to Wellness brochure, 24-hr nurse line magnet and contact information with her NT for her review for later.   Marthenia Rolling, MSN-Ed, RN,BSN [facility] Liaison [phone]

## 2016-09-25 NOTE — Brief Op Note (Signed)
09/25/2016  11:50 AM  PATIENT:  Tammy Christian  36 y.o. female  PRE-OPERATIVE DIAGNOSIS:  Spondylolisthesis - stenosis  POST-OPERATIVE DIAGNOSIS:  Spondylolisthesis - stenosis  PROCEDURE:  Procedure(s): LUMBAR FOUR-FIVE, LUMBAR FIVE-SACRAL ONE POSTERIOR LUMBAR INTERBODY FUSION (N/A)  SURGEON:  Surgeon(s) and Role:    * Earnie Larsson, MD - Primary    * Kary Kos, MD - Assisting  PHYSICIAN ASSISTANT:   ASSISTANTS:    ANESTHESIA:   general  EBL:  Total I/O In: 2110 [I.V.:2000; Blood:110] Out: 630 [Urine:145; Blood:500]  BLOOD ADMINISTERED:none  DRAINS: (med) Hemovact drain(s) in the epidural space with  Suction Open   LOCAL MEDICATIONS USED:  MARCAINE     SPECIMEN:  No Specimen  DISPOSITION OF SPECIMEN:  N/A  COUNTS:  YES  TOURNIQUET:  * No tourniquets in log *  DICTATION: .Dragon Dictation  PLAN OF CARE: Admit to inpatient   PATIENT DISPOSITION:  PACU - hemodynamically stable.   Delay start of Pharmacological VTE agent (>24hrs) due to surgical blood loss or risk of bleeding: yes

## 2016-09-25 NOTE — Anesthesia Procedure Notes (Addendum)
Procedure Name: Intubation Date/Time: 09/25/2016 8:07 AM Performed by: Merrilyn Puma B Pre-anesthesia Checklist: Patient identified, Emergency Drugs available, Suction available and Patient being monitored Patient Re-evaluated:Patient Re-evaluated prior to inductionOxygen Delivery Method: Circle system utilized Preoxygenation: Pre-oxygenation with 100% oxygen Intubation Type: IV induction Ventilation: Mask ventilation without difficulty Laryngoscope Size: Mac and 3 Grade View: Grade I Tube type: Oral Tube size: 7.5 mm Number of attempts: 1 Airway Equipment and Method: Stylet Placement Confirmation: ETT inserted through vocal cords under direct vision,  positive ETCO2,  CO2 detector and breath sounds checked- equal and bilateral Secured at: 23 cm Tube secured with: Tape Dental Injury: Teeth and Oropharynx as per pre-operative assessment  Comments: DLx1 by Derinda Sis SRNA

## 2016-09-26 MED ORDER — PREGABALIN 150 MG PO CAPS: 150.0000 mg | ORAL_CAPSULE | Freq: Three times a day (TID) | ORAL | 5 refills | Status: DC

## 2016-09-26 MED ORDER — DIAZEPAM 5 MG PO TABS: 5.0000 mg | ORAL_TABLET | Freq: Four times a day (QID) | ORAL | 0 refills | Status: DC | PRN

## 2016-09-26 MED FILL — Heparin Sodium (Porcine) Inj 1000 Unit/ML: INTRAMUSCULAR | Qty: 30 | Status: AC

## 2016-09-26 MED FILL — Gelatin Absorbable Sponge Size 100: CUTANEOUS | Qty: 1 | Status: AC

## 2016-09-26 MED FILL — Thrombin For Soln 20000 Unit: CUTANEOUS | Qty: 1 | Status: AC

## 2016-09-26 MED FILL — Sodium Chloride IV Soln 0.9%: INTRAVENOUS | Qty: 3000 | Status: AC

## 2016-09-26 NOTE — Progress Notes (Signed)
Patient is discharged from room 3C09 at this time. Alert and in stable condition. IV site d/c'd and instructions read to patient and mother with understanding verbalized. Left unit via wheelchair with all belongings at side.

## 2016-09-26 NOTE — Progress Notes (Signed)
Physical Therapy Treatment Patient Details Name: Tammy Christian MRN: [id number] DOB: 05-18-1980 Today's Date: 09/26/2016    History of Present Illness Patient is a 36 y/o female who presents s/p L4-5, L5-S1 PLIF. PMH includes asthma, HLD, obesity, anxiety.    PT Comments    Pt progressing towards physical therapy goals. She was educated on car transfer, walking program, precautions, brace application/wearing schedule, and general safety with mobility.  Pt anticipates d/c home today. Feel she is safe for d/c from a PT perspective but will continue to follow acutely.   Follow Up Recommendations  Home health PT;Supervision for mobility/OOB     Equipment Recommendations  Rolling walker with 5" wheels    Recommendations for Other Services OT consult     Precautions / Restrictions Precautions Precautions: Back Precaution Booklet Issued: Yes (comment) Precaution Comments: provided handout and reviewed in detail for adls Required Braces or Orthoses: Spinal Brace Spinal Brace: Lumbar corset;Applied in standing position Restrictions Weight Bearing Restrictions: No    Mobility  Bed Mobility Overal bed mobility: Modified Independent             General bed mobility comments: Pt sitting up in recliner upon PT arrival.   Transfers Overall transfer level: Needs assistance Equipment used: Rolling walker (2 wheeled) Transfers: Sit to/from Stand Sit to Stand: Supervision         General transfer comment: use of bil UE. pt was cued for maintenance of precautions during power-up to full stand.   Ambulation/Gait Ambulation/Gait assistance: Min guard;Supervision Ambulation Distance (Feet): 400 Feet Assistive device: Rolling walker (2 wheeled) Gait Pattern/deviations: Step-through pattern;Decreased stride length;Trunk flexed Gait velocity: decreased Gait velocity interpretation: Below normal speed for age/gender General Gait Details: Min guard progressing to supervision  for safety. Pt was able to ambulate well with RW for UE support.    Stairs            Wheelchair Mobility    Modified Rankin (Stroke Patients Only)       Balance Overall balance assessment: Needs assistance Sitting-balance support: Feet supported;No upper extremity supported Sitting balance-Leahy Scale: Fair Sitting balance - Comments: Assist to donn brace sitting EOB.   Standing balance support: During functional activity;Bilateral upper extremity supported Standing balance-Leahy Scale: Poor Standing balance comment: Reliant on BUEs for support in standing using RW.                             Cognition Arousal/Alertness: Awake/alert Behavior During Therapy: WFL for tasks assessed/performed Overall Cognitive Status: Within Functional Limits for tasks assessed                                        Exercises      General Comments        Pertinent Vitals/Pain Pain Assessment: Faces Faces Pain Scale: Hurts little more Pain Location: back surgical site Pain Descriptors / Indicators: Operative site guarding Pain Intervention(s): Monitored during session;Repositioned    Home Living Family/patient expects to be discharged to:: Private residence Living Arrangements: Alone Available Help at Discharge: Family;Available 24 hours/day (mother/father for 2 months) Type of Home: Apartment Home Access: Level entry   Home Layout: One level Home Equipment: Shower seat;Grab bars - toilet;Grab bars - tub/shower Additional Comments: has 25 pound dog in the home. 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   PT Goals (current goals can now be found in the care plan section) Acute Rehab PT Goals Patient Stated Goal: none stated PT Goal Formulation: With patient Time For Goal Achievement: 10/09/16 Potential to Achieve Goals: Good Progress towards PT  goals: Progressing toward goals    Frequency    Min 5X/week      PT Plan Current plan remains appropriate    Co-evaluation              AM-PAC PT "6 Clicks" Daily Activity  Outcome Measure  Difficulty turning over in bed (including adjusting bedclothes, sheets and blankets)?: None Difficulty moving from lying on back to sitting on the side of the bed? : None Difficulty sitting down on and standing up from a chair with arms (e.g., wheelchair, bedside commode, etc,.)?: Total Help needed moving to and from a bed to chair (including a wheelchair)?: A Little Help needed walking in hospital room?: A Little Help needed climbing 3-5 steps with a railing? : A Lot 6 Click Score: 17    End of Session Equipment Utilized During Treatment: Back brace Activity Tolerance: Patient limited by pain Patient left: Other (comment) (in bathroom with tech) Nurse Communication: Mobility status PT Visit Diagnosis: Unsteadiness on feet (R26.81);Pain;Difficulty in walking, not elsewhere classified (R26.2) Pain - Right/Left: Left Pain - part of body: Leg (back)     Time: 6812-7517 PT Time Calculation (min) (ACUTE ONLY): 24 min  Charges:  $Gait Training: 23-37 mins                    G Codes:       [name], PT, DPT Acute Rehabilitation Services Pager: [phone]    [name] 09/26/2016, 10:06 AM

## 2016-09-26 NOTE — Evaluation (Signed)
Occupational Therapy Evaluation Patient Details Name: Tammy Christian MRN: [id number] DOB: April 14, 1980 Today's Date: 09/26/2016    History of Present Illness Patient is a 36 y/o female who presents s/p L4-5, L5-S1 PLIF. PMH includes asthma, HLD, obesity, anxiety.   Clinical Impression   Patient evaluated by Occupational Therapy with no further acute OT needs identified. All education has been completed and the patient has no further questions. See below for any follow-up Occupational Therapy or equipment needs. OT to sign off. Thank you for referral.      Follow Up Recommendations  No OT follow up    Equipment Recommendations  3 in 1 bedside commode;Other (comment) (rw)    Recommendations for Other Services       Precautions / Restrictions Precautions Precautions: Back Precaution Comments: provided handout and reviewed in detail for adls Required Braces or Orthoses: Spinal Brace Spinal Brace: Lumbar corset;Applied in standing position      Mobility Bed Mobility Overal bed mobility: Modified Independent             General bed mobility comments: incr time but able to sequence  Transfers Overall transfer level: Needs assistance Equipment used: Rolling walker (2 wheeled) Transfers: Sit to/from Stand Sit to Stand: Supervision         General transfer comment: use of bil UE     Balance                                           ADL either performed or assessed with clinical judgement   ADL Overall ADL's : Needs assistance/impaired Eating/Feeding: Independent   Grooming: Wash/dry hands;Wash/dry face;Independent   Upper Body Bathing: Supervision/ safety   Lower Body Bathing: Supervison/ safety;With adaptive equipment Lower Body Bathing Details (indicate cue type and reason): pt requires use of AE educated on use and purchase options         Toilet Transfer: Supervision/safety;Ambulation;BSC     Toileting - Clothing Manipulation  Details (indicate cue type and reason): educated on toilet aide   Tub/Shower Transfer Details (indicate cue type and reason): educated on sequence and use of shower seat Functional mobility during ADLs: Supervision/safety General ADL Comments: all education complete without questions   Pt educated on bathing and avoid washing directly on incision. Pt educated to use new wash cloth and towel each day. Pt educated to allow water to run across dressing and not to soak in a tub at this time. Pt advised RN will instruct on any bandages required otherwise is open to air.   Back handout provided and reviewed adls in detail. Pt educated on: clothing between brace, never sleep in brace, set an alarm at night for medication, avoid sitting for long periods of time, correct bed positioning for sleeping, correct sequence for bed mobility, avoiding lifting more than 5 pounds and never wash directly over incision. All education is complete and patient indicates understanding.    Vision Baseline Vision/History: No visual deficits       Perception     Praxis      Pertinent Vitals/Pain Pain Assessment: Faces Faces Pain Scale: Hurts little more Pain Location: back surgical site Pain Descriptors / Indicators: Operative site guarding Pain Intervention(s): Monitored during session;Premedicated before session;Repositioned     Hand Dominance Right   Extremity/Trunk Assessment Upper Extremity Assessment Upper Extremity Assessment: Overall WFL for tasks assessed   Lower Extremity Assessment Lower  Extremity Assessment: Defer to PT evaluation   Cervical / Trunk Assessment Cervical / Trunk Assessment: Other exceptions Cervical / Trunk Exceptions: s/p spine surgery   Communication Communication Communication: No difficulties   Cognition Arousal/Alertness: Awake/alert Behavior During Therapy: WFL for tasks assessed/performed Overall Cognitive Status: Within Functional Limits for tasks assessed                                      General Comments       Exercises     Shoulder Instructions      Home Living Family/patient expects to be discharged to:: Private residence Living Arrangements: Alone Available Help at Discharge: Family;Available 24 hours/day (mother/father for 2 months) Type of Home: Apartment Home Access: Level entry     Home Layout: One level     Bathroom Shower/Tub: Teacher, early years/pre: Standard     Home Equipment: Shower seat;Grab bars - toilet;Grab bars - tub/shower   Additional Comments: has 25 pound dog in the home. 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        OT Problem List:        OT Treatment/Interventions:      OT Goals(Current goals can be found in the care plan section) Acute Rehab OT Goals Patient Stated Goal: none stated  OT Frequency:     Barriers to D/C:            Co-evaluation              AM-PAC PT "6 Clicks" Daily Activity     Outcome Measure Help from another person eating meals?: None Help from another person taking care of personal grooming?: None Help from another person toileting, which includes using toliet, bedpan, or urinal?: None Help from another person bathing (including washing, rinsing, drying)?: A Little Help from another person to put on and taking off regular upper body clothing?: A Little Help from another person to put on and taking off regular lower body clothing?: A Lot 6 Click Score: 20   End of Session Equipment Utilized During Treatment: Gait belt;Rolling walker;Back brace Nurse Communication: Mobility status  Activity Tolerance: Patient tolerated treatment well Patient left: in chair;with call bell/phone within reach  OT Visit Diagnosis: Unsteadiness on feet (R26.81)                Time: 5638-9373 OT Time Calculation (min): 28  min Charges:  OT General Charges $OT Visit: 1 Procedure OT Evaluation $OT Eval Moderate Complexity: 1 Procedure OT Treatments $Self Care/Home Management : 8-22 mins G-Codes:      Jeri Modena   OTR/L Pager: [phone] Office: [phone] .   Parke Poisson B 09/26/2016, 8:51 AM

## 2016-09-26 NOTE — Discharge Instructions (Signed)

## 2016-09-26 NOTE — Discharge Summary (Signed)
Physician Discharge Summary  Patient ID: Tammy Christian MRN: [id number] DOB/AGE: [birth year] 36 y.o.  Admit date: 09/25/2016 Discharge date: 09/26/2016  Admission Diagnoses:  Discharge Diagnoses:  Active Problems:   Spondylolisthesis at L5-S1 level   Discharged Condition: good  Hospital Course: Patient admitted to the hospital where she underwent an uncomplicated two-level lumbar decompression and fusion. Postoperatively she is done well peer preoperative back and lower extremity pain much improved. Ambulating without difficulty. Ready for discharge home.  Consults:   Significant Diagnostic Studies:   Treatments:   Discharge Exam: Blood pressure 126/69, pulse (!) 108, temperature 97.9 F (36.6 C), resp. rate 18, height 5\' 7"  (1.702 m), weight 117 kg (258 lb), last menstrual period 06/26/2016, SpO2 100 %. Awake and alert. Oriented and appropriate. Motor and sensory function extremities intact. Wound clean and dry. Chest and abdomen benign.  Disposition: 01-Home or Self Care  Discharge Instructions    AMB Referral to [facility]    Complete by:  As directed    Please assign UMR member for post discharge call. Currently at [facility]. Thanks. Marthenia Rolling, [place], RN,BSN [facility] [phone]   Reason for consult:  Please assign UMR member for post discharge call   Expected date of contact:  1-3 days (reserved for hospital discharges)     Allergies as of 09/26/2016      Reactions   Other Hives, Rash   dermabond      Medication List    TAKE these medications   ADULT GUMMY PO Take 2 tablets by mouth daily.   albuterol 108 (90 Base) MCG/ACT inhaler Commonly known as:  PROVENTIL HFA;VENTOLIN HFA Inhale 2 puffs into the lungs every 4 (four) hours as needed. What changed:  reasons to take this   alprazolam 2 MG tablet Commonly known as:  XANAX TAKE 1 TABLET BY MOUTH ONCE DAILY AS NEEDED FOR SLEEP What changed:  See the new  instructions.   Azelastine-Fluticasone 137-50 MCG/ACT Susp Commonly known as:  DYMISTA PLACE 1 SPRAY INTO THE NOSE 2 TIMES DAILY AS NEEDED. What changed:  how much to take  how to take this  when to take this  reasons to take this  additional instructions   Clindamycin-Benzoyl Per (Refr) gel APPLY TO THE AFFECTED AREA(S) DAILY   Clindamycin-Benzoyl Per (Refr) gel APPLY TO THE AFFECTED AREA(S) DAILY   desloratadine 5 MG tablet Commonly known as:  CLARINEX TAKE 1 TABLET (5 MG TOTAL) BY MOUTH DAILY.   diazepam 5 MG tablet Commonly known as:  VALIUM Take 1-2 tablets (5-10 mg total) by mouth every 6 (six) hours as needed for muscle spasms.   etonogestrel 68 MG Impl implant Commonly known as:  NEXPLANON 1 each by Subdermal route once.   famotidine 20 MG tablet Commonly known as:  PEPCID Take 40 mg by mouth at bedtime.   Lorcaserin HCl 10 MG Tabs Take 1 tablet by mouth 2 (two) times daily. What changed:  additional instructions   meloxicam 15 MG tablet Commonly known as:  MOBIC Take 15 mg by mouth daily.   metoprolol succinate 100 MG 24 hr tablet Commonly known as:  TOPROL-XL TAKE 1 TABLET BY MOUTH DAILY. TAKE WITH OR IMMEDIATELY FOLLOWING A MEAL.   montelukast 10 MG tablet Commonly known as:  SINGULAIR TAKE 1 TABLET (10 MG TOTAL) BY MOUTH AT BEDTIME.   oxyCODONE 15 mg 12 hr tablet Commonly known as:  OXYCONTIN Take 1 tablet (15 mg total) by mouth every 12 (twelve) hours.  oxyCODONE 15 MG immediate release tablet Commonly known as:  ROXICODONE Take 1 tablet (15 mg total) by mouth every 6 (six) hours as needed for pain. May refill on or after September 28 2016   pantoprazole 40 MG tablet Commonly known as:  PROTONIX TAKE 1 TABLET (40 MG TOTAL) BY MOUTH DAILY.   Pen Needles 31G X 6 MM Misc For use with victoza /saxenda   pregabalin 150 MG capsule Commonly known as:  LYRICA Take 1 capsule (150 mg total) by mouth 3 (three) times daily.   SUMAtriptan 100 MG  tablet Commonly known as:  IMITREX TAKE 1 TABLET (100 MG TOTAL) BY MOUTH DAILY AS NEEDED. What changed:  reasons to take this   Vitamin D (Ergocalciferol) 50000 units Caps capsule Commonly known as:  DRISDOL TAKE 1 CAPSULE BY MOUTH EVERY 7 DAYS What changed:  See the new instructions.            Durable Medical Equipment        Start     Ordered   09/25/16 1328  DME Walker rolling  Once    Question:  Patient needs a walker to treat with the following condition  Answer:  Spondylolisthesis at L5-S1 level   09/25/16 1327   09/25/16 1328  DME 3 n 1  Once     09/25/16 1327       Signed: Chandler Swiderski A 09/26/2016, 9:53 AM

## 2016-09-27 ENCOUNTER — Other Ambulatory Visit: Payer: Self-pay | Admitting: *Deleted

## 2016-09-27 NOTE — Patient Outreach (Signed)
[place] [facility]) Care Management  09/27/2016  Tammy Christian 02-Jul-1980 [id number]  Subjective: Telephone call to patient's home  / mobile number, no answer, left HIPAA compliant voicemail message, and requested call back.   Objective: Per chart review, patient hospitalized 09/25/16 - 09/26/16 for Spondylolisthesis at L5-S1 level.   Status post L5 Gill procedure with bilateral L5 and S1 decompressive foraminotomies, more than would be required for simple interbody fusion alone. Bilateral L4-5 decompressive laminotomies with foraminotomies L4-5, L5-S1 posterior lumbar interbody fusion utilizing interbody cages and locally harvested autograft, L4-5 S1 posterior lateral arthrodesis utilizing segmental pedicle screw fixation and local autograft on 09/25/16.    Assessment:  Received UMR Transition of care referral on 09/25/16.   Transition of care follow up pending patient contact.     Plan: RNCM will call patient for 2nd telephone outreach attempt, transition of care follow up, within 10 business days if no return call.   Tammy Christian H. Annia Friendly, BSN, [place] [facility] Telephonic CM Phone: [phone] Fax: [phone]

## 2016-09-29 ENCOUNTER — Other Ambulatory Visit: Payer: Self-pay | Admitting: *Deleted

## 2016-09-29 NOTE — Patient Outreach (Signed)
[place] [facility]) Care Management  09/29/2016  Tammy Christian 1980-11-25 [id number]   Subjective: Telephone call to patient's home  / mobile number, no answer, left HIPAA compliant voicemail message, and requested call back.   Objective: Per chart review, patient hospitalized 09/25/16 - 09/26/16 for Spondylolisthesis at L5-S1 level.   Status post L5 Gill procedure with bilateral L5 and S1 decompressive foraminotomies, more than would be required for simple interbody fusion alone. Bilateral L4-5 decompressive laminotomies with foraminotomies L4-5, L5-S1 posterior lumbar interbody fusion utilizing interbody cages and locally harvested autograft, L4-5 S1 posterior lateral arthrodesis utilizing segmental pedicle screw fixation and local autograft on 09/25/16.    Assessment:  Received UMR Transition of care referral on 09/25/16.   Transition of care follow up pending patient contact.     Plan: RNCM will call patient for 3rd telephone outreach attempt, transition of care follow up, within 10 business days if no return call.   Rondell Frick H. Annia Friendly, BSN, [place] [facility] Telephonic CM Phone: [phone] Fax: [phone]

## 2016-10-02 ENCOUNTER — Encounter: Payer: Self-pay | Admitting: *Deleted

## 2016-10-02 ENCOUNTER — Other Ambulatory Visit: Payer: Self-pay | Admitting: *Deleted

## 2016-10-02 NOTE — Patient Outreach (Signed)
[facility]) Care Management  10/02/2016  Tammy Christian Feb 28, 1981 [id number]   Subjective: Telephone call to patient's home / mobile number, no answer, left HIPAA compliant voicemail message, and requested call back.   Objective: Per chart review, patient hospitalized 09/25/16 - 09/26/16 for Spondylolisthesis at L5-S1 level. Status post L5 Gill procedure with bilateral L5 and S1 decompressive foraminotomies, more than would be required for simple interbody fusion alone. Bilateral L4-5 decompressive laminotomies with foraminotomies L4-5, L5-S1 posterior lumbar interbody fusion utilizing interbody cages and locally harvested autograft, L4-5 S1 posterior lateral arthrodesis utilizing segmental pedicle screw fixation and local autograft on 09/25/16.   Assessment: Received UMR Transition of care referral on 09/25/16. Transition of care follow up pending patient contact.     Plan: RNCM will send unsuccessful outreach  letter, [facility] pamphlet, and proceed with case closure, within 10 business days if no return call.  Elizabeht Suto H. Annia Friendly, BSN, [place] [facility] Telephonic CM Phone: [phone] Fax: [phone]

## 2016-10-16 ENCOUNTER — Other Ambulatory Visit: Payer: Self-pay | Admitting: *Deleted

## 2016-10-16 NOTE — Patient Outreach (Signed)
[place] [facility]) Care Management  10/16/2016  Tammy Christian 09/13/1980 [id number]  No response from patient outreach attempts will proceed with case closure.   Objective: Per chart review, patient hospitalized 09/25/16 - 09/26/16 for Spondylolisthesis at L5-S1 level. Status post L5 Gill procedure with bilateral L5 and S1 decompressive foraminotomies, more than would be required for simple interbody fusion alone. Bilateral L4-5 decompressive laminotomies with foraminotomies L4-5, L5-S1 posterior lumbar interbody fusion utilizing interbody cages and locally harvested autograft, L4-5 S1 posterior lateral arthrodesis utilizing segmental pedicle screw fixation and local autograft on 09/25/16.   Assessment: Received UMR Transition of care referral on 09/25/16. Transition of care follow up not completed due to unable to contact patient and will proceed with case closure.    Plan: RNCM will send case closure due to unable to contact request to [name] [place] [facility].    Tammy Christian H. Annia Friendly, BSN, [place] [facility] Telephonic CM Phone: [phone] Fax: [phone]

## 2016-10-18 ENCOUNTER — Encounter: Payer: Self-pay | Admitting: Internal Medicine

## 2016-10-18 ENCOUNTER — Other Ambulatory Visit: Payer: Self-pay | Admitting: Internal Medicine

## 2016-10-18 DIAGNOSIS — K5903 Drug induced constipation: Secondary | ICD-10-CM

## 2016-10-18 DIAGNOSIS — T402X5A Adverse effect of other opioids, initial encounter: Principal | ICD-10-CM

## 2016-10-18 MED ORDER — LINACLOTIDE 290 MCG PO CAPS: 290.0000 ug | ORAL_CAPSULE | Freq: Every day | ORAL | 2 refills | Status: DC

## 2016-10-18 NOTE — Assessment & Plan Note (Signed)
Trial of linzess. 

## 2016-10-19 DIAGNOSIS — Z760 Encounter for issue of repeat prescription: Secondary | ICD-10-CM | POA: Diagnosis not present

## 2016-10-19 DIAGNOSIS — I890 Lymphedema, not elsewhere classified: Secondary | ICD-10-CM | POA: Diagnosis not present

## 2016-10-30 ENCOUNTER — Telehealth: Payer: Self-pay | Admitting: Internal Medicine

## 2016-10-30 ENCOUNTER — Ambulatory Visit: Payer: Self-pay | Admitting: Internal Medicine

## 2016-10-30 MED ORDER — OXYCODONE HCL 15 MG PO TABS: 15.0000 mg | ORAL_TABLET | Freq: Four times a day (QID) | ORAL | 0 refills | Status: DC | PRN

## 2016-10-30 NOTE — Telephone Encounter (Signed)
Please advise, thanks.

## 2016-10-30 NOTE — Telephone Encounter (Signed)
Pt came in at 2:50, she thought her appt was for 3:30. Pt has been rescheduled for 8/20 @ 6 pm. Pt would like to know if she can get a refill on her oxycodone. Please advise, thank you!  Call pt @ [phone]

## 2016-10-30 NOTE — Telephone Encounter (Signed)
Refill printed 

## 2016-10-31 ENCOUNTER — Other Ambulatory Visit: Payer: Self-pay | Admitting: Internal Medicine

## 2016-10-31 ENCOUNTER — Encounter: Payer: Self-pay | Admitting: Internal Medicine

## 2016-11-01 ENCOUNTER — Ambulatory Visit: Payer: Self-pay | Admitting: Obstetrics and Gynecology

## 2016-11-01 DIAGNOSIS — M5416 Radiculopathy, lumbar region: Secondary | ICD-10-CM | POA: Diagnosis not present

## 2016-11-06 ENCOUNTER — Encounter: Payer: Self-pay | Admitting: Internal Medicine

## 2016-11-06 ENCOUNTER — Ambulatory Visit (INDEPENDENT_AMBULATORY_CARE_PROVIDER_SITE_OTHER): Payer: 59 | Admitting: Internal Medicine

## 2016-11-06 DIAGNOSIS — I890 Lymphedema, not elsewhere classified: Secondary | ICD-10-CM | POA: Diagnosis not present

## 2016-11-06 DIAGNOSIS — M5387 Other specified dorsopathies, lumbosacral region: Secondary | ICD-10-CM

## 2016-11-06 DIAGNOSIS — T402X5A Adverse effect of other opioids, initial encounter: Secondary | ICD-10-CM | POA: Diagnosis not present

## 2016-11-06 DIAGNOSIS — M539 Dorsopathy, unspecified: Secondary | ICD-10-CM | POA: Diagnosis not present

## 2016-11-06 DIAGNOSIS — K5903 Drug induced constipation: Secondary | ICD-10-CM

## 2016-11-06 MED ORDER — TRAZODONE HCL 50 MG PO TABS: 25.0000 mg | ORAL_TABLET | Freq: Every evening | ORAL | 3 refills | Status: DC | PRN

## 2016-11-06 MED ORDER — TRAMADOL HCL 50 MG PO TABS: 50.0000 mg | ORAL_TABLET | Freq: Four times a day (QID) | ORAL | 0 refills | Status: DC | PRN

## 2016-11-06 NOTE — Progress Notes (Signed)
Subjective:  Patient ID: Tammy Christian, female    DOB: 1980-10-17  Age: 36 y.o. MRN: [id number]  CC: Diagnoses of Sciatica of left side associated with disorder of lumbosacral spine, Lymphedema of both lower extremities, and Constipation due to opioid therapy were pertinent to this visit.  HPI Tammy Christian presents for MEDICATION REFILL . Patient underwent scheduled lumbar decompressive  on July 9, complicated by prlonged post op ileus. At her request   linzess sent to pharmacy on AUGUST 1.  She states that she finally resolved the ileus using [place] . Bowels moving normally now  Discussed her chronic use of narcotics since late January, first for knee pain , then for back pain.  She has already discontinued the oxycontin as of two weeks ago, and has been using oxycodone prn  , averaging 2 oxycodone daily. HAS not resumed an NSAID since surgery per Dr Irven Baltimore directions,  But not sure when NSAIDs could be resumed.  Repeat x rays show interval healing per patient.  Has not started walking daily for therapy despite being out of work until Sept 9.  Has lost 5 lbs through poor appetite   She appears very tired today,  Accompanied by her mother who has been staying with  Her since the surgery.  She is having significant iinsomnia, which is chronic , has started using melatonin. Discussed adding trazodone.  Screening for depression positive,  But she is deferring treatment at this time.   discussed transitioning to NSAID and tylenol with prn tramadol   Outpatient Medications Prior to Visit  Medication Sig Dispense Refill  . albuterol (PROVENTIL HFA;VENTOLIN HFA) 108 (90 BASE) MCG/ACT inhaler Inhale 2 puffs into the lungs every 4 (four) hours as needed. (Patient taking differently: Inhale 2 puffs into the lungs every 4 (four) hours as needed for wheezing or shortness of breath. ) 3 Inhaler 1  . alprazolam (XANAX) 2 MG tablet TAKE 1 TABLET BY MOUTH ONCE DAILY AS  NEEDED FOR SLEEP (Patient taking differently: TAKE 1 TABLET BY MOUTH ONCE DAILY AS NEEDED FOR ANXIETY) 30 tablet 3  . Azelastine-Fluticasone (DYMISTA) 137-50 MCG/ACT SUSP PLACE 1 SPRAY INTO THE NOSE 2 TIMES DAILY AS NEEDED. (Patient taking differently: Place 1 spray into the nose daily as needed. ) 69 g 11  . Clindamycin-Benzoyl Per, Refr, gel APPLY TO THE AFFECTED AREA(S) DAILY 45 g 1  . Clindamycin-Benzoyl Per, Refr, gel APPLY TO THE AFFECTED AREA(S) DAILY 45 g 1  . desloratadine (CLARINEX) 5 MG tablet TAKE 1 TABLET (5 MG TOTAL) BY MOUTH DAILY. 90 tablet 3  . etonogestrel (NEXPLANON) 68 MG IMPL implant 1 each by Subdermal route once.    . famotidine (PEPCID) 20 MG tablet Take 40 mg by mouth at bedtime.    Marland Kitchen linaclotide (LINZESS) 290 MCG CAPS capsule Take 1 capsule (290 mcg total) by mouth daily before breakfast. 30 capsule 2  . Lorcaserin HCl 10 MG TABS Take 1 tablet by mouth 2 (two) times daily. (Patient taking differently: Take 1 tablet by mouth 2 (two) times daily. Only takes it 2-3 times weekly) 60 tablet 2  . meloxicam (MOBIC) 15 MG tablet Take 15 mg by mouth daily.   3  . metoprolol succinate (TOPROL-XL) 100 MG 24 hr tablet TAKE 1 TABLET BY MOUTH DAILY. TAKE WITH OR IMMEDIATELY FOLLOWING A MEAL. 90 tablet 3  . montelukast (SINGULAIR) 10 MG tablet TAKE 1 TABLET (10 MG TOTAL) BY MOUTH AT BEDTIME. 90 tablet 3  .  Multiple Vitamins-Minerals (ADULT GUMMY PO) Take 2 tablets by mouth daily.    Marland Kitchen oxyCODONE (ROXICODONE) 15 MG immediate release tablet Take 1 tablet (15 mg total) by mouth every 6 (six) hours as needed for pain. 120 tablet 0  . pantoprazole (PROTONIX) 40 MG tablet TAKE 1 TABLET (40 MG TOTAL) BY MOUTH DAILY. 90 tablet 1  . pregabalin (LYRICA) 150 MG capsule Take 1 capsule (150 mg total) by mouth 3 (three) times daily. 90 capsule 5  . SUMAtriptan (IMITREX) 100 MG tablet TAKE 1 TABLET (100 MG TOTAL) BY MOUTH DAILY AS NEEDED. (Patient taking differently: Take 100 mg by mouth daily as needed  for migraine. ) 10 tablet 5  . Vitamin D, Ergocalciferol, (DRISDOL) 50000 units CAPS capsule TAKE 1 CAPSULE BY MOUTH EVERY 7 DAYS (Patient taking differently: TAKE 1 CAPSULE BY MOUTH EVERY 7 DAYS ON SUNDAYS) 12 capsule 3  . traMADol (ULTRAM) 50 MG tablet Take 1 tablet by mouth daily.    . clonazePAM (KLONOPIN) 2 MG tablet Take 2 mg by mouth 2 (two) times daily.    . diazepam (VALIUM) 5 MG tablet Take 1-2 tablets (5-10 mg total) by mouth every 6 (six) hours as needed for muscle spasms. (Patient not taking: Reported on 11/06/2016) 50 tablet 0  . Insulin Pen Needle (PEN NEEDLES) 31G X 6 MM MISC For use with victoza /saxenda (Patient not taking: Reported on 11/06/2016) 100 each 0  . oxyCODONE (OXYCONTIN) 15 mg 12 hr tablet Take 1 tablet (15 mg total) by mouth every 12 (twelve) hours. (Patient not taking: Reported on 11/06/2016) 60 tablet 0   No facility-administered medications prior to visit.     Review of Systems;  Patient denies headache, fevers, malaise, unintentional weight loss, skin rash, eye pain, sinus congestion and sinus pain, sore throat, dysphagia,  hemoptysis , cough, dyspnea, wheezing, chest pain, palpitations, orthopnea, edema, abdominal pain, nausea, melena, diarrhea, constipation, flank pain, dysuria, hematuria, urinary  Frequency, nocturia, numbness, tingling, seizures,  Focal weakness, Loss of consciousness,  Tremor, insomnia, depression, anxiety, and suicidal ideation.      Objective:  BP 118/78 (BP Location: Left Arm, Patient Position: Sitting, Cuff Size: Normal)   Pulse 73   Temp 98.2 F (36.8 C) (Oral)   Resp 20   Wt 253 lb 8 oz (115 kg)   SpO2 97%   BMI 39.70 kg/m   BP Readings from Last 3 Encounters:  11/06/16 118/78  07/12/15 126/78  09/26/16 126/69    Wt Readings from Last 3 Encounters:  11/06/16 253 lb 8 oz (115 kg)  07/12/15 237 lb (107.5 kg)  09/25/16 258 lb (117 kg)    General appearance: alert, cooperative and appears stated age Ears: normal TM's and  external ear canals both ears Throat: lips, mucosa, and tongue normal; teeth and gums normal Neck: no adenopathy, no carotid bruit, supple, symmetrical, trachea midline and thyroid not enlarged, symmetric, no tenderness/mass/nodules Back: symmetric, no curvature. ROM normal. No CVA tenderness. Lungs: clear to auscultation bilaterally Heart: regular rate and rhythm, S1, S2 normal, no murmur, click, rub or gallop Abdomen: soft, non-tender; bowel sounds normal; no masses,  no organomegaly Pulses: 2+ and symmetric Skin: Skin color, texture, turgor normal. No rashes or lesions Lymph nodes: Cervical, supraclavicular, and axillary nodes normal.  Lab Results  Component Value Date   HGBA1C 5.4 04/29/2014    Lab Results  Component Value Date   CREATININE 0.72 09/18/2016   CREATININE 0.79 07/31/2016   CREATININE 0.71 04/11/2016    Lab  Results  Component Value Date   WBC 7.3 09/18/2016   HGB 11.4 (L) 09/18/2016   HCT 36.6 09/18/2016   PLT 309 09/18/2016   GLUCOSE 100 (H) 09/18/2016   CHOL 209 (H) 07/12/2015   TRIG 121.0 07/12/2015   HDL 52.10 07/12/2015   LDLCALC 132 (H) 07/12/2015   ALT 15 07/31/2016   AST 17 07/31/2016   NA 141 09/18/2016   K 4.1 09/18/2016   CL 108 09/18/2016   CREATININE 0.72 09/18/2016   BUN 11 09/18/2016   CO2 25 09/18/2016   TSH 1.53 07/31/2016   HGBA1C 5.4 04/29/2014    No results found.  Assessment & Plan:   Problem List Items Addressed This Visit    Constipation due to opioid therapy    Resolved with linzess and reglan.  Should resolve with dc narcotics.       Lymphedema of leg    Lymphedema pump ordered for more satisfactory management of chronic lymphedema       Sciatica of left side associated with disorder of lumbosacral spine    S/p lumbar decompression July 9 by Tresa Moore.  Her sciatica has resolved.  She continues to treat her post operative pain with oxycodone, averaging 2 daily.  I have advised to reduce use to one daily for one  week,  Then stop.  Begin using tramadol and tylenol prn and add NSAID when cleared by N/s.      Relevant Medications   traZODone (DESYREL) 50 MG tablet      I have changed Ms. Dhami's traMADol. I am also having her start on traZODone. Additionally, I am having her maintain her etonogestrel, albuterol, Azelastine-Fluticasone, famotidine, Clindamycin-Benzoyl Per (Refr), alprazolam, Pen Needles, Vitamin D (Ergocalciferol), metoprolol succinate, Lorcaserin HCl, pantoprazole, SUMAtriptan, meloxicam, Multiple Vitamins-Minerals (ADULT GUMMY PO), montelukast, desloratadine, Clindamycin-Benzoyl Per (Refr), oxyCODONE, diazepam, pregabalin, linaclotide, oxyCODONE, and clonazePAM.  Meds ordered this encounter  Medications  . traZODone (DESYREL) 50 MG tablet    Sig: Take 0.5-1 tablets (25-50 mg total) by mouth at bedtime as needed for sleep.    Dispense:  90 tablet    Refill:  3  . traMADol (ULTRAM) 50 MG tablet    Sig: Take 1 tablet (50 mg total) by mouth every 6 (six) hours as needed.    Dispense:  120 tablet    Refill:  0    Medications Discontinued During This Encounter  Medication Reason  . traMADol (ULTRAM) 50 MG tablet Reorder    Follow-up: No Follow-up on file.   Crecencio Mc, MD

## 2016-11-06 NOTE — Patient Instructions (Addendum)
Reduce your use oxycodone to 1 daily  Once you have the tramadol  and tylenol  On board   We will add an NSAID once you hear from Dr. Trenton Gammon   After one week of once daily use ,  Stop the daily  use of oxycodone  Start your walking program ASAP

## 2016-11-07 NOTE — Assessment & Plan Note (Signed)
Resolved with linzess and reglan.  Should resolve with dc narcotics.

## 2016-11-07 NOTE — Assessment & Plan Note (Addendum)
S/p lumbar decompression July 9 by Tresa Moore.  Her sciatica has resolved.  She continues to treat her post operative pain with oxycodone, averaging 2 daily.  I have advised to reduce use to one daily for one week,  Then stop.  Begin using tramadol and tylenol prn and add NSAID when cleared by N/s.

## 2016-11-07 NOTE — Assessment & Plan Note (Signed)
Lymphedema pump ordered for more satisfactory management of chronic lymphedema

## 2016-11-13 ENCOUNTER — Encounter: Payer: Self-pay | Admitting: Internal Medicine

## 2016-11-13 ENCOUNTER — Other Ambulatory Visit: Payer: Self-pay | Admitting: Internal Medicine

## 2016-11-13 MED ORDER — ALPRAZOLAM 2 MG PO TABS: ORAL_TABLET | ORAL | 3 refills | Status: DC

## 2016-11-13 NOTE — Telephone Encounter (Signed)
Ok to send

## 2016-11-13 NOTE — Telephone Encounter (Signed)
Please advise for refill, based on note I am thinking this is fine to refill, was seen on 11/06/16. Thanks

## 2016-11-16 ENCOUNTER — Telehealth: Payer: Self-pay | Admitting: Internal Medicine

## 2016-11-16 ENCOUNTER — Other Ambulatory Visit: Payer: Self-pay | Admitting: Internal Medicine

## 2016-11-16 MED ORDER — LORCASERIN HCL 10 MG PO TABS: 1.0000 | ORAL_TABLET | Freq: Every day | ORAL | 2 refills | Status: DC

## 2016-11-16 NOTE — Telephone Encounter (Signed)
The patient's information has been forwarded to charge correction to have the no show fee removed.

## 2016-11-16 NOTE — Telephone Encounter (Signed)
Tammy Christian,  Re:  MyChart message sent  To patient :  Tammy Christian,  The no show fee is because I see only 15 patients per day in an effort to provide the best care so when a 30 minute slot is vacated without warning,  There is no time to fill it with another patient and I lose considerable income because I am only paid on the patients I see .  As a Orthoptist I will ask the office to waive the fee this time.   I am out of the office and will not be able to respond to e mails r refill requests  until I return to the office on Wednesday.     If you have an urgent issue , please call the office and allow Juliann Pulse to consult ne of the other physicians who is working today.   Regards,   Deborra Medina, MD

## 2016-11-21 ENCOUNTER — Other Ambulatory Visit: Payer: Self-pay | Admitting: Internal Medicine

## 2016-12-04 ENCOUNTER — Other Ambulatory Visit: Payer: Self-pay | Admitting: Internal Medicine

## 2016-12-19 DIAGNOSIS — M5416 Radiculopathy, lumbar region: Secondary | ICD-10-CM | POA: Diagnosis not present

## 2016-12-26 ENCOUNTER — Encounter: Payer: Self-pay | Admitting: Internal Medicine

## 2016-12-26 ENCOUNTER — Encounter: Payer: Self-pay | Admitting: Advanced Practice Midwife

## 2016-12-26 ENCOUNTER — Ambulatory Visit (INDEPENDENT_AMBULATORY_CARE_PROVIDER_SITE_OTHER): Payer: 59 | Admitting: Advanced Practice Midwife

## 2016-12-26 VITALS — BP 128/76 | HR 109 | Ht 67.0 in | Wt 263.0 lb

## 2016-12-26 DIAGNOSIS — Z113 Encounter for screening for infections with a predominantly sexual mode of transmission: Secondary | ICD-10-CM

## 2016-12-26 DIAGNOSIS — Z124 Encounter for screening for malignant neoplasm of cervix: Secondary | ICD-10-CM

## 2016-12-26 DIAGNOSIS — Z01419 Encounter for gynecological examination (general) (routine) without abnormal findings: Secondary | ICD-10-CM

## 2016-12-26 NOTE — Progress Notes (Signed)
Patient ID: Tammy Christian, female   DOB: [birth year], 36 y.o.   MRN: [id number]     Gynecology Annual Exam  PCP: Crecencio Mc, MD  Chief Complaint:  Chief Complaint  Patient presents with  . Gynecologic Exam    discuss something for sleep  . labwork    STD testing    History of Present Illness: Patient is a 36 y.o. G1P0010 presents for annual exam. The patient has complaints today of sleep disturbance. She has tried benadryl without success. Reviewed non prescription medications and other alternative treatments- increase daily activity and exposure to sunlight, decrease stimulation close to bedtime, decrease caffeine intake closer to bedtime, hot bath, chamomile tea, lavender essential oil for calming. Melatonin OTC. Try these things first prior to initiating Rx medication.    LMP: No LMP recorded. Patient has had an implant. Intermenstrual Bleeding: not applicable Postcoital Bleeding: no Dysmenorrhea: not applicable  The patient is not currently sexually active. She currently uses Nexplanon for contraception. She denies dyspareunia.  The patient does occasionally perform self breast exams.  There is notable family history of breast or ovarian cancer in her family. Her maternal great aunt had ovarian cancer in her 70s. Patient would like to do My Risk today.  The patient wears seatbelts: yes.   The patient has regular exercise: no.  She has just joined the gym and plans to increase exercise. She admits to improvement needed on healthy diet and adequate hydration.  The patient denies current symptoms of depression.    Review of Systems: Review of Systems  Constitutional: Negative.   HENT: Negative.   Eyes: Negative.   Respiratory: Negative.   Cardiovascular: Negative.   Gastrointestinal: Negative.   Genitourinary: Negative.   Musculoskeletal: Negative.   Skin: Negative.   Neurological: Negative.   Endo/Heme/Allergies: Negative.   Psychiatric/Behavioral: Negative.      Past Medical History:  Past Medical History:  Diagnosis Date  . Abnormal Pap smear of cervix 2012  . Abortion 2001  . Allergy   . Anxiety    panic disorder  . Arthritis   . Asthma   . Gastritis 2005  . Hyperlipidemia   . Migraines   . Obesity   . Panic disorder   . Vitamin D deficiency disease     Past Surgical History:  Past Surgical History:  Procedure Laterality Date  . BREAST BIOPSY Right 02/04/2001   Negative c Clip  . CHOLECYSTECTOMY  04/08/2012  . COLPOSCOPY  2012  . KNEE ARTHROSCOPY Left 05/01/2016   Procedure: ARTHROSCOPY KNEE LATERAL RELEASE;  Surgeon: Hessie Knows, MD;  Location: [facility] ORS;  Service: Orthopedics;  Laterality: Left;  . laprascopic sleeve  04/08/2012   GASTRIC  . SPINAL FUSION  09/25/2016    Gynecologic History:  No LMP recorded. Patient has had an implant. Contraception: Nexplanon Last Pap: Results were: no abnormalities   Obstetric History: G1P0010  Family History:  Family History  Problem Relation Age of Onset  . Cancer Maternal Grandmother        LUNG, THROAT  . Macular degeneration Maternal Grandmother   . Heart disease Paternal Grandmother   . Alzheimer's disease Paternal Grandmother   . Stroke Paternal Grandmother   . Cancer Paternal Grandfather        COLON  . Diabetes Father   . Heart disease Father        atrial fib  . Hyperlipidemia Father   . Hypertension Father   . Cancer Father 71  colon polyps  . Colon polyps Father     Social History:  Social History   Social History  . Marital status: Single    Spouse name: N/A  . Number of children: 0  . Years of education: 80   Occupational History  . PHY ASSISTANT      [employer]   Social History Main Topics  . Smoking status: Never Smoker  . Smokeless tobacco: Never Used  . Alcohol use 1.2 oz/week    2 Glasses of wine per week     Comment: socially  . Drug use: No  . Sexual activity: Yes    Birth control/ protection: Implant    Other Topics Concern  . Not on file   Social History Narrative  . No narrative on file    Allergies:  Allergies  Allergen Reactions  . Other Hives and Rash    Dermabond. CATS, SEASONAL ALLERGIES    Medications: Prior to Admission medications   Medication Sig Start Date End Date Taking? Authorizing Provider  albuterol (PROVENTIL HFA;VENTOLIN HFA) 108 (90 BASE) MCG/ACT inhaler Inhale 2 puffs into the lungs every 4 (four) hours as needed. Patient taking differently: Inhale 2 puffs into the lungs every 4 (four) hours as needed for wheezing or shortness of breath.  08/10/14  Yes Crecencio Mc, MD  alprazolam Duanne Moron) 2 MG tablet TAKE 1 TABLET BY MOUTH ONCE DAILY AS NEEDED FOR ANXIETY 11/13/16  Yes Crecencio Mc, MD  Azelastine-Fluticasone (DYMISTA) 137-50 MCG/ACT SUSP PLACE 1 SPRAY INTO THE NOSE 2 TIMES DAILY AS NEEDED. Patient taking differently: Place 1 spray into the nose daily as needed.  03/02/16  Yes Crecencio Mc, MD  Clindamycin-Benzoyl Per, Refr, gel APPLY TO THE AFFECTED AREA(S) DAILY 06/08/16  Yes Crecencio Mc, MD  desloratadine (CLARINEX) 5 MG tablet TAKE 1 TABLET (5 MG TOTAL) BY MOUTH DAILY. 09/18/16  Yes Crecencio Mc, MD  etonogestrel (NEXPLANON) 68 MG IMPL implant 1 each by Subdermal route once.   Yes [provider]  famotidine (PEPCID) 20 MG tablet Take 40 mg by mouth at bedtime.   Yes [provider]  meloxicam (MOBIC) 15 MG tablet Take 15 mg by mouth daily.  08/07/16  Yes [provider]  metoprolol succinate (TOPROL-XL) 100 MG 24 hr tablet TAKE 1 TABLET BY MOUTH DAILY. TAKE WITH OR IMMEDIATELY FOLLOWING A MEAL. 07/12/16  Yes Crecencio Mc, MD  montelukast (SINGULAIR) 10 MG tablet TAKE 1 TABLET (10 MG TOTAL) BY MOUTH AT BEDTIME. 09/18/16  Yes Crecencio Mc, MD  Multiple Vitamins-Minerals (ADULT GUMMY PO) Take 2 tablets by mouth daily.   Yes [provider]  pantoprazole (PROTONIX) 40 MG tablet TAKE 1 TABLET (40 MG TOTAL) BY  MOUTH DAILY. 08/07/16  Yes Crecencio Mc, MD  pregabalin (LYRICA) 150 MG capsule Take 1 capsule (150 mg total) by mouth 3 (three) times daily. 09/26/16  Yes Pool, Mallie Mussel, MD  SUMAtriptan (IMITREX) 100 MG tablet TAKE 1 TABLET (100 MG TOTAL) BY MOUTH DAILY AS NEEDED. Patient taking differently: Take 100 mg by mouth daily as needed for migraine.  08/10/16  Yes Crecencio Mc, MD  traMADol (ULTRAM) 50 MG tablet TAKE 1 TABLET BY MOUTH EVERY 6 HOURS AS NEEDED 12/04/16  Yes Crecencio Mc, MD  Vitamin D, Ergocalciferol, (DRISDOL) 50000 units CAPS capsule TAKE 1 CAPSULE BY MOUTH EVERY 7 DAYS Patient taking differently: TAKE 1 CAPSULE BY MOUTH EVERY 7 DAYS ON SUNDAYS 07/12/16  Yes Tullo,  Aris Everts, MD  bimatoprost (LATISSE) 0.03 % ophthalmic solution  12/22/16   [provider]  cyclobenzaprine (FLEXERIL) 10 MG tablet  12/25/16   [provider]    Physical Exam Vitals: Blood pressure 128/76, pulse (!) 109, height 5\' 7"  (1.702 m), weight 263 lb (119.3 kg).  General: NAD HEENT: normocephalic, anicteric Thyroid: no enlargement, no palpable nodules Pulmonary: No increased work of breathing, CTAB Cardiovascular: RRR, distal pulses 2+ Breast: Breast symmetrical, no tenderness, no palpable nodules or masses, no skin or nipple retraction present, no nipple discharge.  No axillary or supraclavicular lymphadenopathy. Abdomen: NABS, soft, non-tender, non-distended.  Umbilicus without lesions.  No hepatomegaly, splenomegaly or masses palpable. No evidence of hernia  Genitourinary:  External: Normal external female genitalia.  Normal urethral meatus, normal  Bartholin's and Skene's glands.    Vagina: Normal vaginal mucosa, no evidence of prolapse.    Cervix: Grossly normal in appearance, no bleeding, no CMT  Uterus: Non-enlarged, mobile, normal contour.    Adnexa: ovaries non-enlarged, no adnexal masses  Rectal: deferred  Lymphatic: no evidence of inguinal lymphadenopathy Extremities: no edema,  erythema, or tenderness Neurologic: Grossly intact Psychiatric: mood appropriate, affect full   Assessment: 36 y.o. G1P0010 routine annual exam  Plan: Problem List Items Addressed This Visit    None    Visit Diagnoses    Well woman exam with routine gynecological exam    -  Primary   Relevant Orders   STD Panel   IGP,CtNgTv,Apt HPV,rfx16/18,45   MM DIGITAL SCREENING BILATERAL   Screen for sexually transmitted diseases       Relevant Orders   STD Panel   IGP,CtNgTv,Apt HPV,rfx16/18,45   Cervical cancer screening       Relevant Orders   IGP,CtNgTv,Apt HPV,rfx16/18,45      1) STI screening was offered and accepted  2) ASCCP guidelines and rational discussed.  Patient opts for every 3 years screening interval  3) Contraception - Nexplanon current  4) Routine healthcare maintenance including cholesterol, diabetes screening discussed managed by PCP   5) Increase healthy lifestyle diet, hydration, exercise  6) Follow up 1 year for routine annual exam  Rod Can, CNM

## 2016-12-28 LAB — IGP,CTNGTV,APT HPV,RFX16/18,45
Chlamydia, Nuc. Acid Amp: NEGATIVE
Gonococcus, Nuc. Acid Amp: NEGATIVE
HPV Aptima: NEGATIVE
PAP Smear Comment: 0
Trich vag by NAA: NEGATIVE

## 2016-12-28 LAB — RPR+HSVIGM+HBSAG+HSV2(IGG)+...
HIV Screen 4th Generation wRfx: NONREACTIVE
HSV 2 IgG, Type Spec: <0.91 index (ref 0.00–0.90)
HSVI/II Comb IgM: 1.1 Ratio — ABNORMAL HIGH (ref 0.00–0.90)
Hepatitis B Surface Ag: NEGATIVE
RPR Ser Ql: NONREACTIVE

## 2017-01-01 MED ORDER — CLONAZEPAM 0.5 MG PO TABS: 0.5000 mg | ORAL_TABLET | Freq: Two times a day (BID) | ORAL | 0 refills | Status: DC | PRN

## 2017-01-01 NOTE — Telephone Encounter (Signed)
I will authorize a 30 days supply of clonazepam 0.5 mg bid , rx printed

## 2017-01-04 ENCOUNTER — Encounter: Payer: Self-pay | Admitting: Internal Medicine

## 2017-01-04 ENCOUNTER — Other Ambulatory Visit: Payer: Self-pay | Admitting: Internal Medicine

## 2017-01-04 MED ORDER — TRAMADOL HCL 50 MG PO TABS: 50.0000 mg | ORAL_TABLET | Freq: Four times a day (QID) | ORAL | 1 refills | Status: DC | PRN

## 2017-01-05 ENCOUNTER — Other Ambulatory Visit: Payer: Self-pay | Admitting: Internal Medicine

## 2017-01-08 ENCOUNTER — Encounter: Payer: Self-pay | Admitting: Internal Medicine

## 2017-01-08 ENCOUNTER — Ambulatory Visit (INDEPENDENT_AMBULATORY_CARE_PROVIDER_SITE_OTHER): Payer: 59 | Admitting: Internal Medicine

## 2017-01-08 DIAGNOSIS — F411 Generalized anxiety disorder: Secondary | ICD-10-CM

## 2017-01-08 DIAGNOSIS — M5387 Other specified dorsopathies, lumbosacral region: Secondary | ICD-10-CM

## 2017-01-08 DIAGNOSIS — Z6841 Body Mass Index (BMI) 40.0 and over, adult: Secondary | ICD-10-CM

## 2017-01-08 MED ORDER — CELECOXIB 200 MG PO CAPS: 200.0000 mg | ORAL_CAPSULE | Freq: Two times a day (BID) | ORAL | 2 refills | Status: DC

## 2017-01-08 MED ORDER — DIAZEPAM 5 MG PO TABS: 5.0000 mg | ORAL_TABLET | Freq: Two times a day (BID) | ORAL | 1 refills | Status: DC | PRN

## 2017-01-08 NOTE — Progress Notes (Signed)
Subjective:  Patient ID: Tammy Christian, female    DOB: 1981-03-14  Age: 36 y.o. MRN: [id number]  CC: Diagnoses of Generalized anxiety disorder, Sciatica of left side associated with disorder of lumbosacral spine, and Class 3 severe obesity due to excess calories without serious comorbidity with body mass index (BMI) of 40.0 to 44.9 in adult [facility]) were pertinent to this visit.  HPI Tammy Christian presents for management of recurrent anxiety with painic attacks.  Patient had been weaned off of chronic use of clonazepam post operatively and reports that she has noted increasing  Anxiety punctuated by panic attacks. She has no interest in a retrial of SSRIs due to past unsuccessful trials and weight gain.   Anxiety still out of control  Using alprazolam for breakthough panic attacks.  The clonazepam not holding her.  Would like to try valium   Back pain; s/p bilateral L4-5 decompressive laminotomies in July by Tresa Moore. She is still having pain managed with mobic, gabapentin  and tramadol.  Sometimes pain is not controlled  . Has swtched back from lyrica to gabapentin.  Using 100 mg tramadol as needed not daily   Obesity:  History of gastric sleeve surgery.  Weight gain of 13 lbs since August.  Not exercising.  Has joined d a gym but has not started exercising yet. Wants to try saxenda  Not eating much  Appetite not large    Outpatient Medications Prior to Visit  Medication Sig Dispense Refill  . albuterol (PROVENTIL HFA;VENTOLIN HFA) 108 (90 BASE) MCG/ACT inhaler Inhale 2 puffs into the lungs every 4 (four) hours as needed. (Patient taking differently: Inhale 2 puffs into the lungs every 4 (four) hours as needed for wheezing or shortness of breath. ) 3 Inhaler 1  . alprazolam (XANAX) 2 MG tablet TAKE 1 TABLET BY MOUTH ONCE DAILY AS NEEDED FOR ANXIETY 30 tablet 3  . Azelastine-Fluticasone (DYMISTA) 137-50 MCG/ACT SUSP PLACE 1 SPRAY INTO THE NOSE 2 TIMES DAILY AS NEEDED. (Patient  taking differently: Place 1 spray into the nose daily as needed. ) 69 g 11  . bimatoprost (LATISSE) 0.03 % ophthalmic solution   6  . Clindamycin-Benzoyl Per, Refr, gel APPLY TO THE AFFECTED AREA(S) DAILY 45 g 1  . clonazePAM (KLONOPIN) 0.5 MG tablet Take 1 tablet (0.5 mg total) by mouth 2 (two) times daily as needed for anxiety. 60 tablet 0  . cyclobenzaprine (FLEXERIL) 10 MG tablet     . desloratadine (CLARINEX) 5 MG tablet TAKE 1 TABLET (5 MG TOTAL) BY MOUTH DAILY. 90 tablet 3  . etonogestrel (NEXPLANON) 68 MG IMPL implant 1 each by Subdermal route once.    . famotidine (PEPCID) 20 MG tablet Take 40 mg by mouth at bedtime.    . metoprolol succinate (TOPROL-XL) 100 MG 24 hr tablet TAKE 1 TABLET BY MOUTH DAILY. TAKE WITH OR IMMEDIATELY FOLLOWING A MEAL. 90 tablet 3  . montelukast (SINGULAIR) 10 MG tablet TAKE 1 TABLET (10 MG TOTAL) BY MOUTH AT BEDTIME. 90 tablet 3  . Multiple Vitamins-Minerals (ADULT GUMMY PO) Take 2 tablets by mouth daily.    . pantoprazole (PROTONIX) 40 MG tablet TAKE 1 TABLET (40 MG TOTAL) BY MOUTH DAILY. 90 tablet 1  . SUMAtriptan (IMITREX) 100 MG tablet TAKE 1 TABLET (100 MG TOTAL) BY MOUTH DAILY AS NEEDED. (Patient taking differently: Take 100 mg by mouth daily as needed for migraine. ) 10 tablet 5  . traMADol (ULTRAM) 50 MG tablet Take 1 tablet (50 mg  total) by mouth every 6 (six) hours as needed. 120 tablet 1  . Vitamin D, Ergocalciferol, (DRISDOL) 50000 units CAPS capsule TAKE 1 CAPSULE BY MOUTH EVERY 7 DAYS (Patient taking differently: TAKE 1 CAPSULE BY MOUTH EVERY 7 DAYS ON SUNDAYS) 12 capsule 3  . meloxicam (MOBIC) 15 MG tablet Take 15 mg by mouth daily.   3  . pregabalin (LYRICA) 150 MG capsule Take 1 capsule (150 mg total) by mouth 3 (three) times daily. 90 capsule 5   No facility-administered medications prior to visit.     Review of Systems;  Patient denies headache, fevers, malaise, unintentional weight loss, skin rash, eye pain, sinus congestion and sinus  pain, sore throat, dysphagia,  hemoptysis , cough, dyspnea, wheezing, chest pain, palpitations, orthopnea, edema, abdominal pain, nausea, melena, diarrhea, constipation, flank pain, dysuria, hematuria, urinary  Frequency, nocturia, numbness, tingling, seizures,  Focal weakness, Loss of consciousness,  Tremor, insomnia, depression, anxiety, and suicidal ideation.      Objective:  BP 128/74 (BP Location: Left Arm, Patient Position: Sitting, Cuff Size: Normal)   Pulse 83   Temp 98.3 F (36.8 C) (Oral)   Resp 14   Ht 5\' 7"  (1.702 m)   Wt 266 lb 9.6 oz (120.9 kg)   SpO2 98%   BMI 41.76 kg/m   BP Readings from Last 3 Encounters:  01/08/17 128/74  12/26/16 128/76  11/06/16 118/78    Wt Readings from Last 3 Encounters:  01/08/17 266 lb 9.6 oz (120.9 kg)  12/26/16 263 lb (119.3 kg)  11/06/16 253 lb 8 oz (115 kg)    General appearance: alert, cooperative and appears stated age Ears: normal TM's and external ear canals both ears Throat: lips, mucosa, and tongue normal; teeth and gums normal Neck: no adenopathy, no carotid bruit, supple, symmetrical, trachea midline and thyroid not enlarged, symmetric, no tenderness/mass/nodules Back: symmetric, no curvature. ROM normal. No CVA tenderness. Lungs: clear to auscultation bilaterally Heart: regular rate and rhythm, S1, S2 normal, no murmur, click, rub or gallop Abdomen: soft, non-tender; bowel sounds normal; no masses,  no organomegaly Pulses: 2+ and symmetric Skin: Skin color, texture, turgor normal. No rashes or lesions Lymph nodes: Cervical, supraclavicular, and axillary nodes normal.  Lab Results  Component Value Date   HGBA1C 5.4 04/29/2014    Lab Results  Component Value Date   CREATININE 0.72 09/18/2016   CREATININE 0.79 07/31/2016   CREATININE 0.71 04/11/2016    Lab Results  Component Value Date   WBC 7.3 09/18/2016   HGB 11.4 (L) 09/18/2016   HCT 36.6 09/18/2016   PLT 309 09/18/2016   GLUCOSE 100 (H) 09/18/2016    CHOL 209 (H) 07/12/2015   TRIG 121.0 07/12/2015   HDL 52.10 07/12/2015   LDLCALC 132 (H) 07/12/2015   ALT 15 07/31/2016   AST 17 07/31/2016   NA 141 09/18/2016   K 4.1 09/18/2016   CL 108 09/18/2016   CREATININE 0.72 09/18/2016   BUN 11 09/18/2016   CO2 25 09/18/2016   TSH 1.53 07/31/2016   HGBA1C 5.4 04/29/2014    No results found.  Assessment & Plan:   Problem List Items Addressed This Visit    Generalized anxiety disorder    With worsening symptoms since stopping clonazepam.  Trial of valium.. Not receptive to SSRI trial due to past lack of success       Relevant Medications   diazepam (VALIUM) 5 MG tablet   Obesity    I have addressed  BMI and recommended  wt loss of 10% of body weight over the next 6 months using a low fat, low starch, high protein  fruit/vegetable based Mediterranean diet and 30 minutes of aerobic exercise a minimum of 5 days per week.        Sciatica of left side associated with disorder of lumbosacral spine    Trial of celebrex instead of mobic,  Continue tramadol and gabapentin       Relevant Medications   diazepam (VALIUM) 5 MG tablet      I have discontinued Ms. Kijowski's meloxicam and pregabalin. I am also having her start on diazepam and celecoxib. Additionally, I am having her maintain her etonogestrel, albuterol, Azelastine-Fluticasone, famotidine, Clindamycin-Benzoyl Per (Refr), Vitamin D (Ergocalciferol), metoprolol succinate, pantoprazole, SUMAtriptan, Multiple Vitamins-Minerals (ADULT GUMMY PO), montelukast, desloratadine, alprazolam, bimatoprost, cyclobenzaprine, clonazePAM, and traMADol.  Meds ordered this encounter  Medications  . diazepam (VALIUM) 5 MG tablet    Sig: Take 1 tablet (5 mg total) by mouth every 12 (twelve) hours as needed for anxiety.    Dispense:  60 tablet    Refill:  1  . celecoxib (CELEBREX) 200 MG capsule    Sig: Take 1 capsule (200 mg total) by mouth 2 (two) times daily. As needed for pain    Dispense:  60  capsule    Refill:  2    Medications Discontinued During This Encounter  Medication Reason  . pregabalin (LYRICA) 150 MG capsule   . meloxicam (MOBIC) 15 MG tablet     Follow-up: No Follow-up on file.   Crecencio Mc, MD

## 2017-01-08 NOTE — Patient Instructions (Addendum)
Try the headspace app on your phone for deep breathing   Change clonazepam to valium 5 mg twice daily  When your current clonazepam rx has run out (mid November)   chanign meloxicam to celebrex once or twice daily   Return in 6 to 8 week

## 2017-01-09 ENCOUNTER — Encounter: Payer: Self-pay | Admitting: Internal Medicine

## 2017-01-09 NOTE — Assessment & Plan Note (Signed)
With worsening symptoms since stopping clonazepam.  Trial of valium.. Not receptive to SSRI trial due to past lack of success

## 2017-01-09 NOTE — Assessment & Plan Note (Signed)
I have addressed  BMI and recommended wt loss of 10% of body weight over the next 6 months using a low fat, low starch, high protein  fruit/vegetable based Mediterranean diet and 30 minutes of aerobic exercise a minimum of 5 days per week.   

## 2017-01-09 NOTE — Assessment & Plan Note (Signed)
Trial of celebrex instead of mobic,  Continue tramadol and gabapentin

## 2017-01-18 ENCOUNTER — Encounter: Payer: Self-pay | Admitting: Internal Medicine

## 2017-01-18 DIAGNOSIS — Z6841 Body Mass Index (BMI) 40.0 and over, adult: Principal | ICD-10-CM

## 2017-01-20 MED ORDER — PHENTERMINE-TOPIRAMATE ER 3.75-23 MG PO CP24: ORAL_CAPSULE | ORAL | 2 refills | Status: DC

## 2017-01-20 NOTE — Telephone Encounter (Signed)
please print out the Qysmia rx and fax to [facility] on Monday.  Marland Kitchen

## 2017-01-22 MED ORDER — PHENTERMINE-TOPIRAMATE ER 3.75-23 MG PO CP24: ORAL_CAPSULE | ORAL | 2 refills | Status: DC

## 2017-01-26 ENCOUNTER — Encounter: Payer: Self-pay | Admitting: Internal Medicine

## 2017-01-30 ENCOUNTER — Encounter: Payer: Self-pay | Admitting: Internal Medicine

## 2017-02-01 ENCOUNTER — Other Ambulatory Visit: Payer: Self-pay | Admitting: Internal Medicine

## 2017-02-01 ENCOUNTER — Telehealth: Payer: Self-pay

## 2017-02-01 MED ORDER — DIAZEPAM 5 MG PO TABS: 5.0000 mg | ORAL_TABLET | Freq: Two times a day (BID) | ORAL | 5 refills | Status: DC | PRN

## 2017-02-01 NOTE — Telephone Encounter (Signed)
Spoke with pharmacy and they stated that the pt came by this morning and picked up the rx.

## 2017-02-01 NOTE — Telephone Encounter (Signed)
-----   Message from Tammy Mc, MD sent at 02/01/2017  7:13 AM EST ----- Patient requested valium rx via e mail   Notes in chart show it was printed and given to patient in October at visit .  Please check with [facility] and if not filled, print the new rx I authorized and  send to [facility]

## 2017-02-02 ENCOUNTER — Ambulatory Visit: Payer: Self-pay | Admitting: *Deleted

## 2017-02-02 DIAGNOSIS — K529 Noninfective gastroenteritis and colitis, unspecified: Secondary | ICD-10-CM | POA: Diagnosis not present

## 2017-02-02 NOTE — Telephone Encounter (Addendum)
Pt called with complaints of ? Fever (based on chills and hot flashes); vomiting and diarrhea since Wednesday 01/31/17; No appts available at [facility] (flow coordinator Larena Glassman)  or [facility]  (flow coordinator Rena); pt directed to go to urgent care for evaluation; pt verbalizes understanding and will proceed to urgent care for evaluation  Reason for Disposition . [1] MODERATE diarrhea (e.g., 4-6 times / day more than normal) AND [2] present > 48 hours (2 days)  Answer Assessment - Initial Assessment Questions 1. DIARRHEA SEVERITY: "How bad is the diarrhea?" "How many extra stools have you had in the past 24 hours than normal?"    - MILD: Few loose or mushy BMs; increase of 1-3 stools over normal daily number of stools; mild increase in ostomy output.   - MODERATE: Increase of 4-6 stools daily over normal; moderate increase in ostomy output.   - SEVERE (or Worst Possible): Increase of 7 or more stools daily over normal; moderate increase in ostomy output; incontinence.     5-6  2. ONSET: "When did the diarrhea begin?"      Wed 01/31/17 3. BM CONSISTENCY: "How loose or watery is the diarrhea?"      yes 4. VOMITING: "Are you also vomiting?" If so, ask: "How many times in the past 24 hours?"      vomiting on wed and thurs; dry heaves this am (3-4 times)  5. ABDOMINAL PAIN: "Are you having any abdominal pain?" If yes: "What does it feel like?" (e.g., crampy, dull, intermittent, constant)      crampy when needing to use bathroom 6. ABDOMINAL PAIN SEVERITY: If present, ask: "How bad is the pain?"  (e.g., Scale 1-10; mild, moderate, or severe)    - MILD (1-3): doesn't interfere with normal activities, abdomen soft and not tender to touch     - MODERATE (4-7): interferes with normal activities or awakens from sleep, tender to touch     - SEVERE (8-10): excruciating pain, doubled over, unable to do any normal activities       moderate 7. ORAL INTAKE: If vomiting, "Have you been able to drink  liquids?" "How much fluids have you had in the past 24 hours?"     No; approximately 2-8 oz glasses of water   8. HYDRATION: "Any signs of dehydration?" (e.g., dry mouth [not just dry lips], too weak to stand, dizziness, new weight loss) "When did you last urinate?"     Yes dry mouth, ? Weight loss; urinated last night approximately 10 pm 9. EXPOSURE: "Have you traveled to a foreign country recently?" "Have you been exposed to anyone with diarrhea?" "Could you have eaten any food that was spoiled?"     Sees pts in office ([facility])  10. OTHER SYMPTOMS: "Do you have any other symptoms?" (e.g., fever, blood in stool)       no 11. PREGNANCY: "Is there any chance you are pregnant?" "When was your last menstrual period?"       No; birth control implants  Protocols used: DIARRHEA-A-AH

## 2017-02-06 ENCOUNTER — Other Ambulatory Visit: Payer: Self-pay | Admitting: Internal Medicine

## 2017-02-06 ENCOUNTER — Ambulatory Visit: Payer: Self-pay | Admitting: Internal Medicine

## 2017-02-07 NOTE — Telephone Encounter (Signed)
Refill request last seen 01/09/17, last filled 06/08/16.  Please advise.

## 2017-02-27 ENCOUNTER — Encounter: Payer: Self-pay | Admitting: Internal Medicine

## 2017-02-28 ENCOUNTER — Other Ambulatory Visit: Payer: Self-pay | Admitting: Internal Medicine

## 2017-02-28 MED ORDER — CLONAZEPAM 1 MG PO TABS: 1.0000 mg | ORAL_TABLET | Freq: Two times a day (BID) | ORAL | 2 refills | Status: DC

## 2017-03-07 ENCOUNTER — Other Ambulatory Visit: Payer: Self-pay | Admitting: Internal Medicine

## 2017-03-07 NOTE — Telephone Encounter (Signed)
Refilled

## 2017-03-07 NOTE — Telephone Encounter (Signed)
Refilled: 01/04/2017 Last OV: 01/08/2017 Next OV: 03/27/2017

## 2017-03-08 NOTE — Telephone Encounter (Signed)
Printed, signed and faxed.  

## 2017-03-19 ENCOUNTER — Other Ambulatory Visit: Payer: Self-pay | Admitting: Internal Medicine

## 2017-03-19 NOTE — Telephone Encounter (Signed)
Refilled: 11/13/2016 Last OV: 01/08/2017 Next OV: 03/27/2017

## 2017-03-25 ENCOUNTER — Encounter: Payer: Self-pay | Admitting: Internal Medicine

## 2017-03-27 ENCOUNTER — Ambulatory Visit: Payer: Self-pay | Admitting: Internal Medicine

## 2017-04-28 ENCOUNTER — Other Ambulatory Visit: Payer: Self-pay

## 2017-04-28 ENCOUNTER — Encounter: Payer: Self-pay | Admitting: *Deleted

## 2017-04-28 ENCOUNTER — Emergency Department: Payer: 59

## 2017-04-28 ENCOUNTER — Emergency Department
Admission: EM | Admit: 2017-04-28 | Discharge: 2017-04-28 | Disposition: A | Discharge status: Home / Self Care | Payer: 59 | Attending: Emergency Medicine | Admitting: Emergency Medicine

## 2017-04-28 DIAGNOSIS — Z79899 Other long term (current) drug therapy: Secondary | ICD-10-CM | POA: Insufficient documentation

## 2017-04-28 DIAGNOSIS — R109 Unspecified abdominal pain: Secondary | ICD-10-CM | POA: Diagnosis not present

## 2017-04-28 DIAGNOSIS — J45909 Unspecified asthma, uncomplicated: Secondary | ICD-10-CM | POA: Insufficient documentation

## 2017-04-28 DIAGNOSIS — R1012 Left upper quadrant pain: Secondary | ICD-10-CM | POA: Diagnosis present

## 2017-04-28 DIAGNOSIS — D649 Anemia, unspecified: Secondary | ICD-10-CM | POA: Diagnosis not present

## 2017-04-28 DIAGNOSIS — N201 Calculus of ureter: Secondary | ICD-10-CM | POA: Diagnosis not present

## 2017-04-28 DIAGNOSIS — R1111 Vomiting without nausea: Secondary | ICD-10-CM | POA: Diagnosis not present

## 2017-04-28 HISTORY — DX: Calculus of kidney: N20.0

## 2017-04-28 LAB — COMPREHENSIVE METABOLIC PANEL
ALT: 18 U/L (ref 14–54)
AST: 26 U/L (ref 15–41)
Albumin: 4.1 g/dL (ref 3.5–5.0)
Alkaline Phosphatase: 87 U/L (ref 38–126)
Anion gap: 11 (ref 5–15)
BUN: 11 mg/dL (ref 6–20)
CO2: 23 mmol/L (ref 22–32)
Calcium: 9.2 mg/dL (ref 8.9–10.3)
Chloride: 107 mmol/L (ref 101–111)
Creatinine, Ser: 0.76 mg/dL (ref 0.44–1.00)
GFR calc Af Amer: >60 mL/min (ref >60)
GFR calc non Af Amer: >60 mL/min (ref >60)
Glucose, Bld: 104 mg/dL — ABNORMAL HIGH (ref 65–99)
Potassium: 3.8 mmol/L (ref 3.5–5.1)
Sodium: 141 mmol/L (ref 135–145)
Total Bilirubin: 0.5 mg/dL (ref 0.3–1.2)
Total Protein: 7.5 g/dL (ref 6.5–8.1)

## 2017-04-28 LAB — CBC
HCT: 32.1 % — ABNORMAL LOW (ref 35.0–47.0)
Hemoglobin: 10 g/dL — ABNORMAL LOW (ref 12.0–16.0)
MCH: 19.7 pg — ABNORMAL LOW (ref 26.0–34.0)
MCHC: 31.2 g/dL — ABNORMAL LOW (ref 32.0–36.0)
MCV: 63.4 fL — ABNORMAL LOW (ref 80.0–100.0)
Platelets: 394 10*3/uL (ref 150–440)
RBC: 5.06 MIL/uL (ref 3.80–5.20)
RDW: 19 % — ABNORMAL HIGH (ref 11.5–14.5)
WBC: 5.6 10*3/uL (ref 3.6–11.0)

## 2017-04-28 LAB — URINALYSIS, COMPLETE (UACMP) WITH MICROSCOPIC
Bilirubin Urine: NEGATIVE
Glucose, UA: NEGATIVE mg/dL
Ketones, ur: 5 mg/dL — AB
Leukocytes, UA: NEGATIVE
Nitrite: NEGATIVE
Protein, ur: 30 mg/dL — AB
Specific Gravity, Urine: 1.023 (ref 1.005–1.030)
pH: 5 (ref 5.0–8.0)

## 2017-04-28 LAB — POCT PREGNANCY, URINE: Preg Test, Ur: NEGATIVE

## 2017-04-28 MED ORDER — MORPHINE SULFATE (PF) 4 MG/ML IV SOLN
4.0000 mg | Freq: Once | INTRAVENOUS | Status: AC
  Administered 2017-04-28: 4 mg via INTRAVENOUS
  Filled 2017-04-28: qty 1

## 2017-04-28 MED ORDER — SODIUM CHLORIDE 0.9 % IV BOLUS (SEPSIS)
1000.0000 mL | Freq: Once | INTRAVENOUS | Status: AC
  Administered 2017-04-28: 1000 mL via INTRAVENOUS

## 2017-04-28 MED ORDER — OXYCODONE-ACETAMINOPHEN 5-325 MG PO TABS: 1.0000 | ORAL_TABLET | Freq: Four times a day (QID) | ORAL | 0 refills | Status: AC | PRN

## 2017-04-28 MED ORDER — TAMSULOSIN HCL 0.4 MG PO CAPS: 0.4000 mg | ORAL_CAPSULE | Freq: Every day | ORAL | 0 refills | Status: AC

## 2017-04-28 MED ORDER — ONDANSETRON HCL 4 MG/2ML IJ SOLN
4.0000 mg | Freq: Once | INTRAMUSCULAR | Status: AC
  Administered 2017-04-28: 4 mg via INTRAVENOUS
  Filled 2017-04-28: qty 2

## 2017-04-28 MED ORDER — KETOROLAC TROMETHAMINE 30 MG/ML IJ SOLN
INTRAMUSCULAR | Status: AC
  Filled 2017-04-28: qty 1

## 2017-04-28 MED ORDER — TAMSULOSIN HCL 0.4 MG PO CAPS
ORAL_CAPSULE | ORAL | Status: AC
  Filled 2017-04-28: qty 1

## 2017-04-28 MED ORDER — TAMSULOSIN HCL 0.4 MG PO CAPS
0.4000 mg | ORAL_CAPSULE | Freq: Once | ORAL | Status: AC
  Administered 2017-04-28: 0.4 mg via ORAL

## 2017-04-28 MED ORDER — KETOROLAC TROMETHAMINE 30 MG/ML IJ SOLN
30.0000 mg | Freq: Once | INTRAMUSCULAR | Status: AC
  Administered 2017-04-28: 30 mg via INTRAVENOUS
  Filled 2017-04-28: qty 1

## 2017-04-28 MED ORDER — FERROUS SULFATE 325 (65 FE) MG PO TABS: 325.0000 mg | ORAL_TABLET | Freq: Every day | ORAL | 0 refills | Status: DC

## 2017-04-28 MED ORDER — KETOROLAC TROMETHAMINE 30 MG/ML IJ SOLN
30.0000 mg | Freq: Once | INTRAMUSCULAR | Status: AC
  Administered 2017-04-28: 30 mg via INTRAVENOUS

## 2017-04-28 MED ORDER — CEPHALEXIN 500 MG PO CAPS: 500.0000 mg | ORAL_CAPSULE | Freq: Two times a day (BID) | ORAL | 0 refills | Status: AC

## 2017-04-28 MED ORDER — IBUPROFEN 600 MG PO TABS: 600.0000 mg | ORAL_TABLET | Freq: Four times a day (QID) | ORAL | 0 refills | Status: DC | PRN

## 2017-04-28 NOTE — ED Triage Notes (Signed)
PT to ED reporting new onset of left flank pain this morning. 1 episode of vomiting which pt reports is normal for her when she has had kidney stones in the past. No blood noted in urine this morning but that was before the pain started. No burning with urination and no fevers reported.

## 2017-04-28 NOTE — ED Notes (Signed)
States unable to void for urine specimen.

## 2017-04-28 NOTE — ED Notes (Signed)
Pt has not been able to urinate yet, given urine cup and will try again. Fluids going

## 2017-04-28 NOTE — ED Provider Notes (Signed)
[facility] Emergency Department Provider Note ____________________________________________   First MD Initiated Contact with Patient 04/28/17 1835     (approximate)  I have reviewed the triage vital signs and the nursing notes.   HISTORY  Chief Complaint Flank Pain    HPI Tammy Christian is a 37 y.o. female with past medical history as noted below who presents with left flank pain, acute onset approximately 3 hours prior to coming to the ED, intermittent and colicky, nonradiating.  Patient states that it is identical to pain she has had in the past with kidney stones.  Patient reports feeling nauseous and having one episode of vomiting when the pain first started, which is typical for her with kidney stones.  She states that she has had difficulty urinating today but denies any other acute urinary symptoms.  No abdominal pain.  She reports that her last stone was 2 years ago, she has had approximately 3-4, and she has never required a procedure to remove the stone or a lithotripsy.  Past Medical History:  Diagnosis Date  . Abnormal Pap smear of cervix 2012  . Abortion 2001  . Allergy   . Anxiety    panic disorder  . Arthritis   . Asthma   . Gastritis 2005  . Hyperlipidemia   . Kidney stone    multiple  . Migraines   . Obesity   . Panic disorder   . Vitamin D deficiency disease     Patient Active Problem List   Diagnosis Date Noted  . Constipation due to opioid therapy 10/18/2016  . Lymphedema of leg 09/21/2016  . Patellar sleeve fracture of left knee 04/02/2016  . Sciatica of left side associated with disorder of lumbosacral spine 04/02/2016  . Vitamin D deficiency 05/01/2014  . History of migraine 04/29/2014  . Obesity 04/29/2014  . Routine general medical examination at a health care facility 04/29/2014  . Generalized anxiety disorder 04/28/2014  . Seasonal allergies 05/09/2013  . Environmental allergies 05/09/2013  . S/P  laparoscopic sleeve gastrectomy 04/08/2012    Past Surgical History:  Procedure Laterality Date  . BREAST BIOPSY Right 02/04/2001   Negative c Clip  . CHOLECYSTECTOMY  04/08/2012  . COLPOSCOPY  2012  . KNEE ARTHROSCOPY Left 05/01/2016   Procedure: ARTHROSCOPY KNEE LATERAL RELEASE;  Surgeon: Hessie Knows, MD;  Location: [facility] ORS;  Service: Orthopedics;  Laterality: Left;  . laprascopic sleeve  04/08/2012   GASTRIC  . SPINAL FUSION  09/25/2016    Prior to Admission medications   Medication Sig Start Date End Date Taking? Authorizing Provider  albuterol (PROVENTIL HFA;VENTOLIN HFA) 108 (90 BASE) MCG/ACT inhaler Inhale 2 puffs into the lungs every 4 (four) hours as needed. Patient taking differently: Inhale 2 puffs into the lungs every 4 (four) hours as needed for wheezing or shortness of breath.  08/10/14   Crecencio Mc, MD  alprazolam Duanne Moron) 2 MG tablet TAKE 1 TABLET BY MOUTH ONCE DAILY AS NEEDED 03/19/17   Crecencio Mc, MD  Azelastine-Fluticasone (DYMISTA) 137-50 MCG/ACT SUSP PLACE 1 SPRAY INTO THE NOSE 2 TIMES DAILY AS NEEDED. Patient taking differently: Place 1 spray into the nose daily as needed.  03/02/16   Crecencio Mc, MD  bimatoprost (LATISSE) 0.03 % ophthalmic solution  12/22/16   [provider]  celecoxib (CELEBREX) 200 MG capsule Take 1 capsule (200 mg total) by mouth 2 (two) times daily. As needed for pain 01/08/17   Crecencio Mc, MD  Clindamycin-Benzoyl  Per, Refr, gel APPLY TO THE AFFECTED AREA(S) DAILY 06/08/16   Crecencio Mc, MD  Clindamycin-Benzoyl Per, Refr, gel APPLY TO THE AFFECTED AREA(S) DAILY 02/10/17   Crecencio Mc, MD  clonazePAM (KLONOPIN) 1 MG tablet Take 1 tablet (1 mg total) by mouth 2 (two) times daily. 02/28/17   Crecencio Mc, MD  cyclobenzaprine (FLEXERIL) 10 MG tablet  12/25/16   [provider]  desloratadine (CLARINEX) 5 MG tablet TAKE 1 TABLET (5 MG TOTAL) BY MOUTH DAILY. 09/18/16   Crecencio Mc, MD  etonogestrel  (NEXPLANON) 68 MG IMPL implant 1 each by Subdermal route once.    [provider]  famotidine (PEPCID) 20 MG tablet Take 40 mg by mouth at bedtime.    [provider]  metoprolol succinate (TOPROL-XL) 100 MG 24 hr tablet TAKE 1 TABLET BY MOUTH DAILY. TAKE WITH OR IMMEDIATELY FOLLOWING A MEAL. 07/12/16   Crecencio Mc, MD  montelukast (SINGULAIR) 10 MG tablet TAKE 1 TABLET (10 MG TOTAL) BY MOUTH AT BEDTIME. 09/18/16   Crecencio Mc, MD  Multiple Vitamins-Minerals (ADULT GUMMY PO) Take 2 tablets by mouth daily.    [provider]  pantoprazole (PROTONIX) 40 MG tablet TAKE 1 TABLET (40 MG TOTAL) BY MOUTH DAILY. 02/06/17   Crecencio Mc, MD  Phentermine-Topiramate (QSYMIA) 3.75-23 MG CP24 One tablet daily for 14 days., then 2 tablets daily thereafter 01/22/17   Crecencio Mc, MD  SUMAtriptan (IMITREX) 100 MG tablet TAKE 1 TABLET (100 MG TOTAL) BY MOUTH DAILY AS NEEDED. Patient taking differently: Take 100 mg by mouth daily as needed for migraine.  08/10/16   Crecencio Mc, MD  traMADol (ULTRAM) 50 MG tablet TAKE 1 TABLET BY MOUTH EVERY 6 HOURS AS NEEDED 03/07/17   Crecencio Mc, MD  Vitamin D, Ergocalciferol, (DRISDOL) 50000 units CAPS capsule TAKE 1 CAPSULE BY MOUTH EVERY 7 DAYS Patient taking differently: TAKE 1 CAPSULE BY MOUTH EVERY 7 DAYS ON SUNDAYS 07/12/16   Crecencio Mc, MD    Allergies Other  Family History  Problem Relation Age of Onset  . Cancer Maternal Grandmother        LUNG, THROAT  . Macular degeneration Maternal Grandmother   . Heart disease Paternal Grandmother   . Alzheimer's disease Paternal Grandmother   . Stroke Paternal Grandmother   . Cancer Paternal Grandfather        COLON  . Diabetes Father   . Heart disease Father        atrial fib  . Hyperlipidemia Father   . Hypertension Father   . Cancer Father 92       colon polyps  . Colon polyps Father     Social History Social History   Tobacco Use  . Smoking status: Never  Smoker  . Smokeless tobacco: Never Used  Substance Use Topics  . Alcohol use: Yes    Alcohol/week: 1.2 oz    Types: 2 Glasses of wine per week    Comment: socially  . Drug use: No    Review of Systems  Constitutional: No fever. Eyes: No  redness. ENT: No neck pain. Cardiovascular: Denies chest pain. Respiratory: Denies shortness of breath. Gastrointestinal: For resolved vomiting.  Genitourinary: Negative for dysuria.  Musculoskeletal: Positive for back pain. Skin: Negative for rash. Neurological: Negative for headache.   ____________________________________________   PHYSICAL EXAM:  VITAL SIGNS: ED Triage Vitals  Enc Vitals Group     BP 04/28/17 1538 138/84  Pulse Rate 04/28/17 1538 91     Resp 04/28/17 1538 16     Temp 04/28/17 1538 97.8 F (36.6 C)     Temp Source 04/28/17 1538 Oral     SpO2 04/28/17 1538 99 %     Weight 04/28/17 1539 245 lb (111.1 kg)     Height 04/28/17 1539 5\' 7"  (1.702 m)     Head Circumference --      Peak Flow --      Pain Score 04/28/17 1538 5     Pain Loc --      Pain Edu? --      Excl. in [place]? --     Constitutional: Alert and oriented. Well appearing and in no acute distress. Eyes: Conjunctivae are normal.  Head: Atraumatic. Nose: No congestion/rhinnorhea. Mouth/Throat: Mucous membranes are moist.   Neck: Normal range of motion.  Cardiovascular:  Good peripheral circulation. Respiratory: Normal respiratory effort.  No retractions. Gastrointestinal: Soft and nontender. No distention.  Genitourinary: No CVA tenderness.  Mild left flank tenderness. Musculoskeletal:  Extremities warm and well perfused.  Neurologic:  Normal speech and language. No gross focal neurologic deficits are appreciated.  Skin:  Skin is warm and dry. No rash noted. Psychiatric: Mood and affect are normal. Speech and behavior are normal.  ____________________________________________   LABS (all labs ordered are listed, but only abnormal results are  displayed)  Labs Reviewed  URINALYSIS, COMPLETE (UACMP) WITH MICROSCOPIC - Abnormal; Notable for the following components:      Result Value   Color, Urine YELLOW (*)    APPearance CLOUDY (*)    Hgb urine dipstick LARGE (*)    Ketones, ur 5 (*)    Protein, ur 30 (*)    Bacteria, UA RARE (*)    Squamous Epithelial / LPF 0-5 (*)    All other components within normal limits  COMPREHENSIVE METABOLIC PANEL - Abnormal; Notable for the following components:   Glucose, Bld 104 (*)    All other components within normal limits  CBC - Abnormal; Notable for the following components:   Hemoglobin 10.0 (*)    HCT 32.1 (*)    MCV 63.4 (*)    MCH 19.7 (*)    MCHC 31.2 (*)    RDW 19.0 (*)    All other components within normal limits  POC URINE PREG, ED  POCT PREGNANCY, URINE   ____________________________________________  EKG   ____________________________________________  RADIOLOGY  CT abdomen: 2 mm distal left ureter stone  ____________________________________________   PROCEDURES  Procedure(s) performed: No  Procedures  Critical Care performed: No ____________________________________________   INITIAL IMPRESSION / ASSESSMENT AND PLAN / ED COURSE  Pertinent labs & imaging results that were available during my care of the patient were reviewed by me and considered in my medical decision making (see chart for details).  37 year old female with past medical history as noted above including prior history of multiple ureteral stones presents with acute onset of left flank pain with one episode of vomiting, feeling identical to symptoms from prior ureteral stones.    I reviewed the past medical records in [place], and confirmed the patient's most recent imaging related to a ureteral stone was a CT in 2017 which revealed a 4 mm stone.  Patient states that the stone passed on its own, and she has not had another episode more recently.  On exam today, vital signs are normal, the  patient is slightly uncomfortable generally well-appearing, and the remainder the exam is as  described above.  Presentation is most consistent with ureteral stone.  I would like to avoid unnecessary CT in this patient with multiple prior ureteral stones that have not required intervention, and given that her presentation today is consistent, we will start with labs, fluids, analgesia.  This is based on shared decision making with the patient, and she expresses agreement at this time with this plan.  The patient has severe refractory pain, elevated creatinine or other concerning lab findings, or UA which is not consistent with ureteral stone, I will consider imaging.    ----------------------------------------- 11:35 PM on 04/28/2017 -----------------------------------------  Patient's UA showed significant WBCs, and given her history of prior UTI/pyelonephritis with similar symptoms, as well as recurrent pain, we elected to obtain a CT to further evaluate.  CT confirms 2 mm distal ureter stone. Based on the small size and patient's now well controlled pain, no indication for urologic consultation or further emergent intervention.  Patient is now comfortable, and would like to go home.  Given the presence of WBCs and bacteria on the UA, in addition to analgesia and Flomax I will also give an antibiotic.  Patient's workup also revealed a microcytic anemia, so we will start her on iron.  I instructed the patient to follow-up with her primary care doctor to further workup the anemia.  Discharge plan and instructions explained, and return precautions given.  The patient expresses understanding.  ____________________________________________   FINAL CLINICAL IMPRESSION(S) / ED DIAGNOSES  Final diagnoses:  Ureteral stone  Anemia, unspecified type      NEW MEDICATIONS STARTED DURING THIS VISIT:  New Prescriptions   No medications on file     Note:  This document was prepared using Dragon voice  recognition software and may include unintentional dictation errors.    Arta Silence, MD 04/28/17 2337

## 2017-04-28 NOTE — Discharge Instructions (Signed)
Return to the ER for new, worsening, or persistent severe pain, weakness or lightheadedness, fevers, persistent blood in the urine, abdominal pain, or any other new or worsening symptoms that concern you.  Take the antibiotic (Keflex) as prescribed and finish the full course.  You can take the medications for pain as needed.  You should also start the iron pill.  Follow-up with your primary care doctor within the next 1-2 weeks as you will likely need further workup for the anemia.

## 2017-05-01 ENCOUNTER — Other Ambulatory Visit: Payer: Self-pay | Admitting: Internal Medicine

## 2017-05-02 NOTE — Telephone Encounter (Signed)
Refilled: 02/10/2017 Last OV: 01/08/2017 Next OV: 05/15/2017

## 2017-05-07 ENCOUNTER — Encounter: Payer: Self-pay | Admitting: Internal Medicine

## 2017-05-07 DIAGNOSIS — Z3046 Encounter for surveillance of implantable subdermal contraceptive: Secondary | ICD-10-CM | POA: Diagnosis not present

## 2017-05-07 DIAGNOSIS — N921 Excessive and frequent menstruation with irregular cycle: Secondary | ICD-10-CM | POA: Diagnosis not present

## 2017-05-07 DIAGNOSIS — Z30430 Encounter for insertion of intrauterine contraceptive device: Secondary | ICD-10-CM | POA: Diagnosis not present

## 2017-05-07 DIAGNOSIS — N201 Calculus of ureter: Secondary | ICD-10-CM

## 2017-05-07 DIAGNOSIS — N83202 Unspecified ovarian cyst, left side: Secondary | ICD-10-CM | POA: Diagnosis not present

## 2017-05-15 ENCOUNTER — Encounter: Payer: Self-pay | Admitting: Internal Medicine

## 2017-05-15 ENCOUNTER — Ambulatory Visit: Payer: 59 | Admitting: Internal Medicine

## 2017-05-15 VITALS — BP 112/68 | HR 83 | Temp 98.0°F | Resp 14 | Ht 67.0 in | Wt 259.0 lb

## 2017-05-15 DIAGNOSIS — D649 Anemia, unspecified: Secondary | ICD-10-CM

## 2017-05-15 DIAGNOSIS — Z6836 Body mass index (BMI) 36.0-36.9, adult: Secondary | ICD-10-CM | POA: Diagnosis not present

## 2017-05-15 DIAGNOSIS — N921 Excessive and frequent menstruation with irregular cycle: Secondary | ICD-10-CM

## 2017-05-15 DIAGNOSIS — N3001 Acute cystitis with hematuria: Secondary | ICD-10-CM | POA: Diagnosis not present

## 2017-05-15 DIAGNOSIS — D509 Iron deficiency anemia, unspecified: Secondary | ICD-10-CM | POA: Insufficient documentation

## 2017-05-15 DIAGNOSIS — E6609 Other obesity due to excess calories: Secondary | ICD-10-CM

## 2017-05-15 MED ORDER — ONDANSETRON HCL 4 MG PO TABS: 4.0000 mg | ORAL_TABLET | Freq: Three times a day (TID) | ORAL | 0 refills | Status: DC | PRN

## 2017-05-15 MED ORDER — LIRAGLUTIDE -WEIGHT MANAGEMENT 18 MG/3ML ~~LOC~~ SOPN: 0.6000 mg | PEN_INJECTOR | Freq: Every day | SUBCUTANEOUS | 0 refills | Status: DC

## 2017-05-15 NOTE — Patient Instructions (Addendum)
We will try to obtain  PA again for Saxenda  Since Qysmia was not tolerated    Iron,  A1c,  b12  Cbc all done today

## 2017-05-15 NOTE — Assessment & Plan Note (Signed)
With history of menorrhagia, suspect iron, but will check B12 a  aswell  .

## 2017-05-15 NOTE — Assessment & Plan Note (Addendum)
Did not toerate Qsymia..  Will obtain PA for Saxenda . Phentermine not an option but  she is not exercising.

## 2017-05-15 NOTE — Progress Notes (Signed)
Subjective:  Patient ID: Tammy Christian, female    DOB: September 23, 1980  Age: 37 y.o. MRN: [id number]  CC: The primary encounter diagnosis was Anemia, unspecified type. Diagnoses of Menorrhagia with irregular cycle, Acute cystitis with hematuria, and Class 2 obesity due to excess calories with body mass index (BMI) of 36.0 to 36.9 in adult, unspecified whether serious comorbidity present were also pertinent to this visit.  HPI Tammy Christian presents for follow up on several issues  Morbid Obesity: lost  7 lbs.  Not tolerating Qsymia (topiramate ) paid $300 out of pocket  contrave c/i due to use of tramadol due to kidney stones. Ct done Feb 9.  Seeing brandon in [place].  Had been dehydrated   New onset anemia noted during ER visit .   hgb 10 prior to iv fluids..  Anxeity:  Using a lower dose of clonazepam than previously    Outpatient Medications Prior to Visit  Medication Sig Dispense Refill  . alprazolam (XANAX) 2 MG tablet TAKE 1 TABLET BY MOUTH ONCE DAILY AS NEEDED 30 tablet 3  . Azelastine-Fluticasone (DYMISTA) 137-50 MCG/ACT SUSP PLACE 1 SPRAY INTO THE NOSE 2 TIMES DAILY AS NEEDED. (Patient taking differently: Place 1 spray into the nose daily as needed. ) 69 g 11  . bimatoprost (LATISSE) 0.03 % ophthalmic solution   6  . celecoxib (CELEBREX) 200 MG capsule Take 1 capsule (200 mg total) by mouth 2 (two) times daily. As needed for pain 60 capsule 2  . Clindamycin-Benzoyl Per, Refr, gel APPLY TO THE AFFECTED AREA(S) DAILY 45 g 1  . clonazePAM (KLONOPIN) 1 MG tablet Take 1 tablet (1 mg total) by mouth 2 (two) times daily. 60 tablet 2  . cyclobenzaprine (FLEXERIL) 10 MG tablet     . desloratadine (CLARINEX) 5 MG tablet TAKE 1 TABLET (5 MG TOTAL) BY MOUTH DAILY. 90 tablet 3  . famotidine (PEPCID) 20 MG tablet Take 40 mg by mouth at bedtime.    . metoprolol succinate (TOPROL-XL) 100 MG 24 hr tablet TAKE 1 TABLET BY MOUTH DAILY. TAKE WITH OR IMMEDIATELY FOLLOWING A MEAL. 90 tablet 3   . montelukast (SINGULAIR) 10 MG tablet TAKE 1 TABLET (10 MG TOTAL) BY MOUTH AT BEDTIME. 90 tablet 3  . Multiple Vitamins-Minerals (ADULT GUMMY PO) Take 2 tablets by mouth daily.    . pantoprazole (PROTONIX) 40 MG tablet TAKE 1 TABLET (40 MG TOTAL) BY MOUTH DAILY. 90 tablet 1  . SUMAtriptan (IMITREX) 100 MG tablet TAKE 1 TABLET (100 MG TOTAL) BY MOUTH DAILY AS NEEDED. (Patient taking differently: Take 100 mg by mouth daily as needed for migraine. ) 10 tablet 5  . traMADol (ULTRAM) 50 MG tablet TAKE 1 TABLET BY MOUTH EVERY 6 HOURS AS NEEDED 120 tablet 1  . Vitamin D, Ergocalciferol, (DRISDOL) 50000 units CAPS capsule TAKE 1 CAPSULE BY MOUTH EVERY 7 DAYS (Patient taking differently: TAKE 1 CAPSULE BY MOUTH EVERY 7 DAYS ON SUNDAYS) 12 capsule 3  . albuterol (PROVENTIL HFA;VENTOLIN HFA) 108 (90 BASE) MCG/ACT inhaler Inhale 2 puffs into the lungs every 4 (four) hours as needed. (Patient taking differently: Inhale 2 puffs into the lungs every 4 (four) hours as needed for wheezing or shortness of breath. ) 3 Inhaler 1  . Clindamycin-Benzoyl Per, Refr, gel APPLY TO THE AFFECTED AREA(S) DAILY (Patient not taking: Reported on 05/15/2017) 45 g 1  . etonogestrel (NEXPLANON) 68 MG IMPL implant 1 each by Subdermal route once.    . ferrous sulfate 325 (65  FE) MG tablet Take 1 tablet (325 mg total) by mouth daily. (Patient not taking: Reported on 05/15/2017) 30 tablet 0  . ibuprofen (ADVIL,MOTRIN) 600 MG tablet Take 1 tablet (600 mg total) by mouth every 6 (six) hours as needed. (Patient not taking: Reported on 05/15/2017) 30 tablet 0  . Phentermine-Topiramate (QSYMIA) 3.75-23 MG CP24 One tablet daily for 14 days., then 2 tablets daily thereafter (Patient not taking: Reported on 05/15/2017) 60 capsule 2   No facility-administered medications prior to visit.     Review of Systems;  Patient denies headache, fevers, malaise, unintentional weight loss, skin rash, eye pain, sinus congestion and sinus pain, sore throat,  dysphagia,  hemoptysis , cough, dyspnea, wheezing, chest pain, palpitations, orthopnea, edema, abdominal pain, nausea, melena, diarrhea, constipation, flank pain, dysuria, hematuria, urinary  Frequency, nocturia, numbness, tingling, seizures,  Focal weakness, Loss of consciousness,  Tremor, insomnia, depression, anxiety, and suicidal ideation.      Objective:  BP 112/68 (BP Location: Left Arm, Patient Position: Sitting, Cuff Size: Large)   Pulse 83   Temp 98 F (36.7 C) (Oral)   Resp 14   Ht 5\' 7"  (1.702 m)   Wt 259 lb (117.5 kg)   LMP 04/28/2017 Comment: neg upt  SpO2 99%   BMI 40.57 kg/m   BP Readings from Last 3 Encounters:  05/15/17 112/68  04/28/17 127/80  01/08/17 128/74    Wt Readings from Last 3 Encounters:  05/15/17 259 lb (117.5 kg)  04/28/17 245 lb (111.1 kg)  01/08/17 266 lb 9.6 oz (120.9 kg)    General appearance: alert, cooperative and appears stated age Ears: normal TM's and external ear canals both ears Throat: lips, mucosa, and tongue normal; teeth and gums normal Neck: no adenopathy, no carotid bruit, supple, symmetrical, trachea midline and thyroid not enlarged, symmetric, no tenderness/mass/nodules Back: symmetric, no curvature. ROM normal. No CVA tenderness. Lungs: clear to auscultation bilaterally Heart: regular rate and rhythm, S1, S2 normal, no murmur, click, rub or gallop Abdomen: soft, non-tender; bowel sounds normal; no masses,  no organomegaly Pulses: 2+ and symmetric Skin: Skin color, texture, turgor normal. No rashes or lesions Lymph nodes: Cervical, supraclavicular, and axillary nodes normal.  Lab Results  Component Value Date   HGBA1C 5.4 04/29/2014    Lab Results  Component Value Date   CREATININE 0.76 04/28/2017   CREATININE 0.72 09/18/2016   CREATININE 0.79 07/31/2016    Lab Results  Component Value Date   WBC 5.6 04/28/2017   HGB 10.0 (L) 04/28/2017   HCT 32.1 (L) 04/28/2017   PLT 394 04/28/2017   GLUCOSE 104 (H)  04/28/2017   CHOL 209 (H) 07/12/2015   TRIG 121.0 07/12/2015   HDL 52.10 07/12/2015   LDLCALC 132 (H) 07/12/2015   ALT 18 04/28/2017   AST 26 04/28/2017   NA 141 04/28/2017   K 3.8 04/28/2017   CL 107 04/28/2017   CREATININE 0.76 04/28/2017   BUN 11 04/28/2017   CO2 23 04/28/2017   TSH 1.53 07/31/2016   HGBA1C 5.4 04/29/2014    Ct Renal Stone Study  Result Date: 04/28/2017 CLINICAL DATA:  Left flank pain beginning this morning. One episode of vomiting. History of kidney stones. EXAM: CT ABDOMEN AND PELVIS WITHOUT CONTRAST TECHNIQUE: Multidetector CT imaging of the abdomen and pelvis was performed following the standard protocol without IV contrast. COMPARISON:  09/01/2015 FINDINGS: Lower chest: Clear lung bases.  Heart normal size. Hepatobiliary: No focal liver abnormality is seen. Status post cholecystectomy. No biliary dilatation.  Pancreas: Unremarkable. No pancreatic ductal dilatation or surrounding inflammatory changes. Spleen: Normal in size without focal abnormality. Adrenals/Urinary Tract: No adrenal masses. There is mild dilation of left intrarenal collecting system and portions of the left ureter. This is due to a 2 mm stone adjacent to the left ureterovesicular junction. No other ureteral stone. Single 1-2 mm stone in the lower pole the left kidney. No other intrarenal stones. No renal masses. Right ureters normal course and in caliber. Bladder is unremarkable. Stomach/Bowel: There changes from previous gastric surgery, stable from the prior exam. Stomach otherwise unremarkable. Appendix appears normal. No evidence of bowel wall thickening, distention, or inflammatory changes. Vascular/Lymphatic: No significant vascular findings are present. No enlarged abdominal or pelvic lymph nodes. Reproductive: Normal uterus. 4 cm left adnexal/ovarian cyst. No other ovarian/adnexal abnormalities. Other: No abdominal wall hernia.  No ascites. Musculoskeletal: No fracture or acute finding. Status post  posterior fusion at L4, L5 and S1, new since the prior CT. No osteoblastic or osteolytic lesions. IMPRESSION: 1. 2 mm stone in the distal ureter adjacent to the ureterovesicular junction causing mild left hydronephrosis. No other acute abnormalities. 2. Small left stretching stone in the lower pole the left kidney. 3. Status post gastric surgery and cholecystectomy. Status post lumbar spine fusion surgery. 4. 4 cm left ovarian cyst. This is almost certainly benign and no follow-up is recommended in this age group. Electronically Signed   By: Lajean Manes M.D.   On: 04/28/2017 21:55    Assessment & Plan:   Problem List Items Addressed This Visit    Obesity    Did not toerate Qsymia..  Will obtain PA for Saxenda . Phentermine not an option but  she is not exercising.       Relevant Medications   Liraglutide -Weight Management (SAXENDA) 18 MG/3ML SOPN   Other Relevant Orders   Lipid panel   Hemoglobin A1c   Anemia - Primary    With history of menorrhagia, suspect iron, but will check B12 a  aswell  .      Relevant Orders   Comprehensive metabolic panel   Iron, TIBC and Ferritin Panel   Vitamin B12   RBC Folate   CBC with Differential/Platelet    Other Visit Diagnoses    Menorrhagia with irregular cycle       Relevant Orders   TSH   CBC with Differential/Platelet   Acute cystitis with hematuria       Relevant Orders   Urinalysis, Routine w reflex microscopic   Urine Culture      I have discontinued Joelene Millin A. Lymon "[name]"'s etonogestrel, Phentermine-Topiramate, ibuprofen, and ferrous sulfate. I am also having her start on ondansetron and Liraglutide -Weight Management. Additionally, I am having her maintain her albuterol, Azelastine-Fluticasone, famotidine, Vitamin D (Ergocalciferol), metoprolol succinate, SUMAtriptan, Multiple Vitamins-Minerals (ADULT GUMMY PO), montelukast, desloratadine, bimatoprost, cyclobenzaprine, celecoxib, pantoprazole, Clindamycin-Benzoyl Per (Refr),  clonazePAM, traMADol, and alprazolam.  Meds ordered this encounter  Medications  . ondansetron (ZOFRAN) 4 MG tablet    Sig: Take 1 tablet (4 mg total) by mouth every 8 (eight) hours as needed for nausea or vomiting.    Dispense:  20 tablet    Refill:  0  . Liraglutide -Weight Management (SAXENDA) 18 MG/3ML SOPN    Sig: Inject 0.6 mg into the skin daily. Increase dose weekly as follows: Week 2: 1.2 mg daily ; Week 3: 1.8 mg daily; Week 4: 2.4 mg daily    Dispense:  9 mL    Refill:  0    Medications Discontinued During This Encounter  Medication Reason  . Clindamycin-Benzoyl Per, Refr, gel Duplicate  . etonogestrel (NEXPLANON) 68 MG IMPL implant Change in therapy  . ferrous sulfate 325 (65 FE) MG tablet Patient has not taken in last 30 days  . ibuprofen (ADVIL,MOTRIN) 600 MG tablet Patient has not taken in last 30 days  . Phentermine-Topiramate (QSYMIA) 3.75-23 MG CP24 Patient has not taken in last 30 days    Follow-up: Return in about 3 months (around 08/12/2017).   Crecencio Mc, MD

## 2017-05-16 ENCOUNTER — Encounter: Payer: Self-pay | Admitting: Internal Medicine

## 2017-05-16 ENCOUNTER — Other Ambulatory Visit: Payer: Self-pay | Admitting: Internal Medicine

## 2017-05-16 LAB — CBC WITH DIFFERENTIAL/PLATELET
Basophils Absolute: 0.1 10*3/uL (ref 0.0–0.1)
Basophils Relative: 1.3 % (ref 0.0–3.0)
Eosinophils Absolute: 0 10*3/uL (ref 0.0–0.7)
Eosinophils Relative: 0.5 % (ref 0.0–5.0)
HCT: 31.1 % — ABNORMAL LOW (ref 36.0–46.0)
Hemoglobin: 9.6 g/dL — ABNORMAL LOW (ref 12.0–15.0)
Lymphocytes Relative: 28.9 % (ref 12.0–46.0)
Lymphs Abs: 2.1 10*3/uL (ref 0.7–4.0)
MCHC: 30.8 g/dL (ref 30.0–36.0)
MCV: 65.5 fl — ABNORMAL LOW (ref 78.0–100.0)
Monocytes Absolute: 0.6 10*3/uL (ref 0.1–1.0)
Monocytes Relative: 7.6 % (ref 3.0–12.0)
Neutro Abs: 4.6 10*3/uL (ref 1.4–7.7)
Neutrophils Relative %: 61.7 % (ref 43.0–77.0)
Platelets: 429 10*3/uL — ABNORMAL HIGH (ref 150.0–400.0)
RBC: 4.75 Mil/uL (ref 3.87–5.11)
RDW: 19.1 % — ABNORMAL HIGH (ref 11.5–15.5)
WBC: 7.4 10*3/uL (ref 4.0–10.5)

## 2017-05-16 LAB — URINALYSIS, ROUTINE W REFLEX MICROSCOPIC
Bilirubin Urine: NEGATIVE
Hgb urine dipstick: NEGATIVE
Ketones, ur: NEGATIVE
Leukocytes, UA: NEGATIVE
Nitrite: NEGATIVE
RBC / HPF: NONE SEEN (ref 0–?)
Specific Gravity, Urine: 1.01 (ref 1.000–1.030)
Total Protein, Urine: NEGATIVE
Urine Glucose: NEGATIVE
Urobilinogen, UA: 0.2 (ref 0.0–1.0)
pH: 7 (ref 5.0–8.0)

## 2017-05-16 LAB — URINE CULTURE
MICRO NUMBER:: 90250369
Result:: NO GROWTH
SPECIMEN QUALITY:: ADEQUATE

## 2017-05-16 LAB — VITAMIN B12: Vitamin B-12: 149 pg/mL — ABNORMAL LOW (ref 211–911)

## 2017-05-16 LAB — COMPREHENSIVE METABOLIC PANEL
ALT: 14 U/L (ref 0–35)
AST: 17 U/L (ref 0–37)
Albumin: 4 g/dL (ref 3.5–5.2)
Alkaline Phosphatase: 79 U/L (ref 39–117)
BUN: 8 mg/dL (ref 6–23)
CO2: 27 mEq/L (ref 19–32)
Calcium: 9.1 mg/dL (ref 8.4–10.5)
Chloride: 103 mEq/L (ref 96–112)
Creatinine, Ser: 0.6 mg/dL (ref 0.40–1.20)
GFR: 118.74 mL/min (ref >60.00)
Glucose, Bld: 85 mg/dL (ref 70–99)
Potassium: 4 mEq/L (ref 3.5–5.1)
Sodium: 137 mEq/L (ref 135–145)
Total Bilirubin: 0.2 mg/dL (ref 0.2–1.2)
Total Protein: 7.1 g/dL (ref 6.0–8.3)

## 2017-05-16 LAB — IRON,TIBC AND FERRITIN PANEL
%SAT: 3 % (calc) — ABNORMAL LOW (ref 11–50)
Ferritin: 5 ng/mL — ABNORMAL LOW (ref 10–154)
Iron: 17 ug/dL — ABNORMAL LOW (ref 40–190)
TIBC: 500 mcg/dL (calc) — ABNORMAL HIGH (ref 250–450)

## 2017-05-16 LAB — LIPID PANEL
Cholesterol: 214 mg/dL — ABNORMAL HIGH (ref 0–200)
HDL: 52.5 mg/dL (ref >39.00)
LDL Cholesterol: 129 mg/dL — ABNORMAL HIGH (ref 0–99)
NonHDL: 161.27
Total CHOL/HDL Ratio: 4
Triglycerides: 161 mg/dL — ABNORMAL HIGH (ref 0.0–149.0)
VLDL: 32.2 mg/dL (ref 0.0–40.0)

## 2017-05-16 LAB — TSH: TSH: 1.36 u[IU]/mL (ref 0.35–4.50)

## 2017-05-16 LAB — FOLATE RBC: RBC Folate: 1060 ng/mL RBC (ref >280)

## 2017-05-16 LAB — HEMOGLOBIN A1C: Hgb A1c MFr Bld: 5.8 % (ref 4.6–6.5)

## 2017-05-16 MED ORDER — FERROUS SULFATE 325 (65 FE) MG PO TABS: ORAL_TABLET | ORAL | 5 refills | Status: DC

## 2017-05-18 ENCOUNTER — Telehealth: Payer: Self-pay

## 2017-05-18 NOTE — Telephone Encounter (Signed)
PA for saxenda has been submitted on covermymeds.  

## 2017-05-21 ENCOUNTER — Encounter: Payer: Self-pay | Admitting: Internal Medicine

## 2017-05-21 MED ORDER — CYANOCOBALAMIN 1000 MCG/ML IJ SOLN: INTRAMUSCULAR | 0 refills | Status: DC

## 2017-05-21 MED ORDER — "SYRINGE 25G X 1"" 3 ML MISC": 0 refills | Status: DC

## 2017-05-21 MED ORDER — VITAMIN D (ERGOCALCIFEROL) 1.25 MG (50000 UNIT) PO CAPS: ORAL_CAPSULE | ORAL | 3 refills | Status: DC

## 2017-05-21 NOTE — Telephone Encounter (Signed)
Last VIt D 07/15/15 ok to fill vitamin D?

## 2017-05-25 NOTE — Progress Notes (Signed)
Saxenda PA sent through Cover my meds.

## 2017-05-29 DIAGNOSIS — N83202 Unspecified ovarian cyst, left side: Secondary | ICD-10-CM | POA: Diagnosis not present

## 2017-05-29 DIAGNOSIS — Z975 Presence of (intrauterine) contraceptive device: Secondary | ICD-10-CM | POA: Diagnosis not present

## 2017-05-29 DIAGNOSIS — N921 Excessive and frequent menstruation with irregular cycle: Secondary | ICD-10-CM | POA: Diagnosis not present

## 2017-05-30 ENCOUNTER — Other Ambulatory Visit: Payer: Self-pay | Admitting: Internal Medicine

## 2017-05-30 MED ORDER — TRAMADOL HCL 50 MG PO TABS: 50.0000 mg | ORAL_TABLET | Freq: Four times a day (QID) | ORAL | 5 refills | Status: DC | PRN

## 2017-05-30 NOTE — Telephone Encounter (Signed)
Tramadol     Refilled: 03/07/2017  Clonazepam     Refilled: 02/28/2017  Last OV: 05/15/2017 Next OV: 08/14/2017

## 2017-05-31 ENCOUNTER — Ambulatory Visit: Payer: Self-pay | Admitting: Urology

## 2017-05-31 ENCOUNTER — Encounter: Payer: Self-pay | Admitting: Internal Medicine

## 2017-05-31 NOTE — Telephone Encounter (Signed)
Both rxs have been printed, signed and faxed.

## 2017-06-01 ENCOUNTER — Encounter: Payer: Self-pay | Admitting: Internal Medicine

## 2017-06-01 DIAGNOSIS — K2950 Unspecified chronic gastritis without bleeding: Secondary | ICD-10-CM

## 2017-06-06 DIAGNOSIS — Z0279 Encounter for issue of other medical certificate: Secondary | ICD-10-CM

## 2017-06-07 ENCOUNTER — Encounter: Payer: Self-pay | Admitting: Internal Medicine

## 2017-06-07 ENCOUNTER — Telehealth: Payer: Self-pay | Admitting: Internal Medicine

## 2017-06-07 DIAGNOSIS — K2970 Gastritis, unspecified, without bleeding: Secondary | ICD-10-CM | POA: Insufficient documentation

## 2017-06-07 DIAGNOSIS — N200 Calculus of kidney: Secondary | ICD-10-CM | POA: Insufficient documentation

## 2017-06-07 NOTE — Telephone Encounter (Signed)
FMLA for kidney stones and anemia/gastritis has been completed and returned in red folder.  The charge is $50

## 2017-06-11 NOTE — Telephone Encounter (Signed)
Faxed the FMLA paperwork. Also notified pt through mychart that we have completed and faxed the paperwork and that we also placed a copy up front for her to come by and pick up.

## 2017-06-18 NOTE — Progress Notes (Signed)
06/19/2017 4:16 PM   Tammy Christian [birth year] [id number]  Referring provider: Crecencio Mc, MD [address], [place] [address]  Chief Complaint  Patient presents with  . Nephrolithiasis    New Patient    HPI: 37 year old female who presents today for further evaluation of recurrent nephrolithiasis.  Most recently, she was seen in the emergency room on 04/28/2017 found to have a 2 mm left distal ureteral calculus as well as a punctate left lower pole stone.  Her last episode of pain was about 1 week ago.  She did not to the stone pass but has had no other issues.  She does have a personal history of recurrent nephrolithiasis.  She is been passing stones that she was 37 years old.  She generally passes 1-2 stones per year.  More recently, she is passed stones increased frequency, several in just a few months.    Stone episodes include severe flank pain, nausea, and vomiting.  She reports that all of her stones have been hung up at the [facility] requiring anywhere from 1 day to up to 1-week to pass.  She has an unknown stone composition.  She does have a personal family history of stones.  Notably, her stone are quite small, ranging in 1-2 mm.  She is never required surgical intervention.  She does have a personal history of morbid obesity status post gastric sleeve.  She does report that she has difficulty with intake of fluids.  She drinks relatively minimal water.  She enjoys coffee and Coke/Pepsi's.   PMH: Past Medical History:  Diagnosis Date  . Abnormal Pap smear of cervix 2012  . Abortion 2001  . Allergy   . Anxiety    panic disorder  . Arthritis   . Asthma   . Gastritis 2005  . Hyperlipidemia   . Kidney stone    multiple  . Migraines   . Obesity   . Panic disorder   . Vitamin D deficiency disease     Surgical History: Past Surgical History:  Procedure Laterality Date  . BREAST BIOPSY Right 02/04/2001   Negative c Clip  .  CHOLECYSTECTOMY  04/08/2012  . COLPOSCOPY  2012  . KNEE ARTHROSCOPY Left 05/01/2016   Procedure: ARTHROSCOPY KNEE LATERAL RELEASE;  Surgeon: Hessie Knows, MD;  Location: [facility] ORS;  Service: Orthopedics;  Laterality: Left;  . laprascopic sleeve  04/08/2012   GASTRIC  . SPINAL FUSION  09/25/2016    Home Medications:  Allergies as of 06/19/2017      Reactions   Other Hives, Rash   Dermabond. CATS, SEASONAL ALLERGIES      Medication List        Accurate as of 06/19/17  4:16 PM. Always use your most recent med list.          ADULT GUMMY PO Take 2 tablets by mouth daily.   albuterol 108 (90 Base) MCG/ACT inhaler Commonly known as:  PROVENTIL HFA;VENTOLIN HFA Inhale 2 puffs into the lungs every 4 (four) hours as needed.   alprazolam 2 MG tablet Commonly known as:  XANAX TAKE 1 TABLET BY MOUTH ONCE DAILY AS NEEDED   Azelastine-Fluticasone 137-50 MCG/ACT Susp Commonly known as:  DYMISTA PLACE 1 SPRAY INTO THE NOSE 2 TIMES DAILY AS NEEDED.   bimatoprost 0.03 % ophthalmic solution Commonly known as:  LATISSE   celecoxib 200 MG capsule Commonly known as:  CELEBREX Take 1 capsule (200 mg total) by mouth 2 (two) times daily. As  needed for pain   Clindamycin-Benzoyl Per (Refr) gel APPLY TO THE AFFECTED AREA(S) DAILY   clonazePAM 1 MG tablet Commonly known as:  KLONOPIN Take 1 tablet (1 mg total) by mouth 2 (two) times daily.   cyanocobalamin 1000 MCG/ML injection Commonly known as:  (VITAMIN B-12) 1000 mcg injected daily for 3 days,  Then weekly x 4 then monthly thereafter   cyclobenzaprine 10 MG tablet Commonly known as:  FLEXERIL   desloratadine 5 MG tablet Commonly known as:  CLARINEX TAKE 1 TABLET (5 MG TOTAL) BY MOUTH DAILY.   famotidine 20 MG tablet Commonly known as:  PEPCID Take 40 mg by mouth at bedtime.   ferrous sulfate 325 (65 FE) MG tablet One tablet daily with orange juice   Liraglutide -Weight Management 18 MG/3ML Sopn Commonly known as:   SAXENDA Inject 0.6 mg into the skin daily. Increase dose weekly as follows: Week 2: 1.2 mg daily ; Week 3: 1.8 mg daily; Week 4: 2.4 mg daily   methocarbamol 500 MG tablet Commonly known as:  ROBAXIN   metoprolol succinate 100 MG 24 hr tablet Commonly known as:  TOPROL-XL TAKE 1 TABLET BY MOUTH DAILY. TAKE WITH OR IMMEDIATELY FOLLOWING A MEAL.   montelukast 10 MG tablet Commonly known as:  SINGULAIR TAKE 1 TABLET (10 MG TOTAL) BY MOUTH AT BEDTIME.   norgestimate-ethinyl estradiol 0.25-35 MG-MCG tablet Commonly known as:  ORTHO-CYCLEN,SPRINTEC,PREVIFEM Take by mouth.   ondansetron 4 MG tablet Commonly known as:  ZOFRAN Take 1 tablet (4 mg total) by mouth every 8 (eight) hours as needed for nausea or vomiting.   pantoprazole 40 MG tablet Commonly known as:  PROTONIX TAKE 1 TABLET (40 MG TOTAL) BY MOUTH DAILY.   SUMAtriptan 100 MG tablet Commonly known as:  IMITREX TAKE 1 TABLET (100 MG TOTAL) BY MOUTH DAILY AS NEEDED.   SYRINGE 3CC/25GX1" 25G X 1" 3 ML Misc Use for b12 injections   traMADol 50 MG tablet Commonly known as:  ULTRAM Take 1 tablet (50 mg total) by mouth every 6 (six) hours as needed.   Vitamin D (Ergocalciferol) 50000 units Caps capsule Commonly known as:  DRISDOL TAKE 1 CAPSULE BY MOUTH EVERY 7 DAYS ON SUNDAYS       Allergies:  Allergies  Allergen Reactions  . Other Hives and Rash    Dermabond. CATS, SEASONAL ALLERGIES    Family History: Family History  Problem Relation Age of Onset  . Cancer Maternal Grandmother        LUNG, THROAT  . Macular degeneration Maternal Grandmother   . Heart disease Paternal Grandmother   . Alzheimer's disease Paternal Grandmother   . Stroke Paternal Grandmother   . Cancer Paternal Grandfather        COLON  . Diabetes Father   . Heart disease Father        atrial fib  . Hyperlipidemia Father   . Hypertension Father   . Cancer Father 83       colon polyps  . Colon polyps Father     Social History:  reports  that she has never smoked. She has never used smokeless tobacco. She reports that she drinks about 1.2 oz of alcohol per week. She reports that she does not use drugs.  ROS: UROLOGY Frequent Urination?: No Hard to postpone urination?: No Burning/pain with urination?: No Get up at night to urinate?: No Leakage of urine?: No Urine stream starts and stops?: No Trouble starting stream?: No Do you have to strain to  urinate?: No Blood in urine?: Yes Urinary tract infection?: No Sexually transmitted disease?: No Injury to kidneys or bladder?: No Painful intercourse?: No Weak stream?: No Currently pregnant?: No Vaginal bleeding?: No Last menstrual period?: n  Gastrointestinal Nausea?: No Vomiting?: No Indigestion/heartburn?: Yes Diarrhea?: Yes Constipation?: Yes  Constitutional Fever: No Night sweats?: No Weight loss?: No Fatigue?: No  Skin Skin rash/lesions?: No Itching?: No  Eyes Blurred vision?: No Double vision?: No  Ears/Nose/Throat Sore throat?: No Sinus problems?: No  Hematologic/Lymphatic Swollen glands?: No Easy bruising?: No  Cardiovascular Leg swelling?: No Chest pain?: No  Respiratory Cough?: No Shortness of breath?: No  Endocrine Excessive thirst?: No  Musculoskeletal Back pain?: Yes Joint pain?: No  Neurological Headaches?: Yes Dizziness?: No  Psychologic Depression?: No Anxiety?: Yes  Physical Exam: BP 120/81   Pulse 96   Ht 5\' 7"  (1.702 m)   Wt 240 lb (108.9 kg)   BMI 37.59 kg/m   Constitutional:  Alert and oriented, No acute distress. HEENT: [place] AT, moist mucus membranes.  Trachea midline, no masses. Cardiovascular: No clubbing, cyanosis, or edema. Respiratory: Normal respiratory effort, no increased work of breathing. GI: Abdomen is soft, nontender, nondistended, no abdominal masses, obese.   GU: No CVA tenderness Skin: No rashes, bruises or suspicious lesions. Neurologic: Grossly intact, no focal deficits, moving all 4  extremities. Psychiatric: Normal mood and affect.  Laboratory Data: Lab Results  Component Value Date   WBC 7.4 05/15/2017   HGB 9.6 (L) 05/15/2017   HCT 31.1 (L) 05/15/2017   MCV 65.5 Repeated and verified X2. (L) 05/15/2017   PLT 429.0 (H) 05/15/2017    Lab Results  Component Value Date   CREATININE 0.60 05/15/2017    Lab Results  Component Value Date   HGBA1C 5.8 05/15/2017    Urinalysis Results for orders placed or performed in visit on 06/19/17  Urinalysis, Complete  Result Value Ref Range   Specific Gravity, UA 1.025 1.005 - 1.030   pH, UA 7.0 5.0 - 7.5   Color, UA Yellow Yellow   Appearance Ur Clear Clear   Leukocytes, UA Negative Negative   Protein, UA 1+ (A) Negative/Trace   Glucose, UA Negative Negative   Ketones, UA Trace (A) Negative   RBC, UA Negative Negative   Bilirubin, UA Negative Negative   Urobilinogen, Ur 0.2 0.2 - 1.0 mg/dL   Nitrite, UA Negative Negative    Pertinent Imaging: CLINICAL DATA:  Left flank pain beginning this morning. One episode of vomiting. History of kidney stones.  EXAM: CT ABDOMEN AND PELVIS WITHOUT CONTRAST  TECHNIQUE: Multidetector CT imaging of the abdomen and pelvis was performed following the standard protocol without IV contrast.  COMPARISON:  09/01/2015  FINDINGS: Lower chest: Clear lung bases.  Heart normal size.  Hepatobiliary: No focal liver abnormality is seen. Status post cholecystectomy. No biliary dilatation.  Pancreas: Unremarkable. No pancreatic ductal dilatation or surrounding inflammatory changes.  Spleen: Normal in size without focal abnormality.  Adrenals/Urinary Tract: No adrenal masses.  There is mild dilation of left intrarenal collecting system and portions of the left ureter. This is due to a 2 mm stone adjacent to the left ureterovesicular junction. No other ureteral stone. Single 1-2 mm stone in the lower pole the left kidney. No other intrarenal stones. No renal masses.  Right ureters normal course and in caliber. Bladder is unremarkable.  Stomach/Bowel: There changes from previous gastric surgery, stable from the prior exam. Stomach otherwise unremarkable. Appendix appears normal. No evidence of bowel wall thickening,  distention, or inflammatory changes.  Vascular/Lymphatic: No significant vascular findings are present. No enlarged abdominal or pelvic lymph nodes.  Reproductive: Normal uterus. 4 cm left adnexal/ovarian cyst. No other ovarian/adnexal abnormalities.  Other: No abdominal wall hernia.  No ascites.  Musculoskeletal: No fracture or acute finding. Status post posterior fusion at L4, L5 and S1, new since the prior CT. No osteoblastic or osteolytic lesions.  IMPRESSION: 1. 2 mm stone in the distal ureter adjacent to the ureterovesicular junction causing mild left hydronephrosis. No other acute abnormalities. 2. Small left stretching stone in the lower pole the left kidney. 3. Status post gastric surgery and cholecystectomy. Status post lumbar spine fusion surgery. 4. 4 cm left ovarian cyst. This is almost certainly benign and no follow-up is recommended in this age group.   Electronically Signed   By: Lajean Manes M.D.   On: 04/28/2017 21:55  CT scan personally reviewed today with the patient.   Assessment & Plan:    1. Recurrent nephrolithiasis Multiple recurrent small stones, greater than one episode annually  Most recent episode 04/2017 with resolution of her pain, given small size statistically likely that she is passed the stone without intervention thus further follow-up imaging is not necessary  We discussed general stone prevention techniques including drinking plenty water with goal of producing 2.5 L urine daily, increased citric acid intake, avoidance of high oxalate containing foods, and decreased salt intake.  Information about dietary recommendations given today.   I strongly recommended pursuing 89-HTDS  urine metabolic workup to which she is agreeable-we will pursue [facility] and call with results.  She would likely benefit from medical stone prophylaxis if there is any derangement notable given her extensive history.  Finally, we did devise a pain plan for her next stone episode.  She was prescribed Flomax, Zofran, as well as intranasal Toradol today for help with pain control to hopefully keep her out of the emergency room with her next episode.  Given that all of her stones have been only 1-2 mm, is highly likely that her neck stone will also be quite small and will pass spontaneously based on her history.  She is agreeable to this plan.  She will call or present to the office if she fails to respond to the above intervention.  - Urinalysis, Complete  Hollice Espy, MD  [facility] [address] [place], [place] [address] [phone]

## 2017-06-19 ENCOUNTER — Ambulatory Visit (INDEPENDENT_AMBULATORY_CARE_PROVIDER_SITE_OTHER): Payer: 59 | Admitting: Urology

## 2017-06-19 ENCOUNTER — Encounter: Payer: Self-pay | Admitting: Urology

## 2017-06-19 VITALS — BP 120/81 | HR 96 | Ht 67.0 in | Wt 240.0 lb

## 2017-06-19 DIAGNOSIS — N200 Calculus of kidney: Secondary | ICD-10-CM | POA: Diagnosis not present

## 2017-06-19 LAB — URINALYSIS, COMPLETE
Bilirubin, UA: NEGATIVE
Glucose, UA: NEGATIVE
Leukocytes, UA: NEGATIVE
Nitrite, UA: NEGATIVE
RBC, UA: NEGATIVE
Specific Gravity, UA: 1.025 (ref 1.005–1.030)
Urobilinogen, Ur: 0.2 mg/dL (ref 0.2–1.0)
pH, UA: 7 (ref 5.0–7.5)

## 2017-06-19 MED ORDER — ONDANSETRON HCL 4 MG PO TABS: 4.0000 mg | ORAL_TABLET | Freq: Three times a day (TID) | ORAL | 0 refills | Status: DC | PRN

## 2017-06-19 MED ORDER — TAMSULOSIN HCL 0.4 MG PO CAPS: 0.4000 mg | ORAL_CAPSULE | Freq: Every day | ORAL | 0 refills | Status: DC

## 2017-07-02 ENCOUNTER — Encounter: Payer: 59 | Admitting: Gastroenterology

## 2017-07-02 ENCOUNTER — Other Ambulatory Visit: Payer: Self-pay

## 2017-07-02 ENCOUNTER — Ambulatory Visit: Payer: 59 | Admitting: Gastroenterology

## 2017-07-02 ENCOUNTER — Encounter: Payer: Self-pay | Admitting: Gastroenterology

## 2017-07-02 VITALS — BP 110/72 | HR 69 | Temp 98.1°F | Ht 67.0 in | Wt 245.2 lb

## 2017-07-02 DIAGNOSIS — D500 Iron deficiency anemia secondary to blood loss (chronic): Secondary | ICD-10-CM | POA: Diagnosis not present

## 2017-07-02 DIAGNOSIS — D519 Vitamin B12 deficiency anemia, unspecified: Secondary | ICD-10-CM | POA: Diagnosis not present

## 2017-07-02 DIAGNOSIS — N83202 Unspecified ovarian cyst, left side: Secondary | ICD-10-CM | POA: Insufficient documentation

## 2017-07-02 NOTE — Progress Notes (Signed)
Error

## 2017-07-02 NOTE — Progress Notes (Signed)
Cephas Darby, MD [address]  [facility]  [place], [place] [address]  Main: [phone]  Fax: [phone]    Gastroenterology Consultation  Referring Provider:     Crecencio Mc, MD Primary Care Physician:  Crecencio Mc, MD Primary Gastroenterologist:  Dr. Cephas Darby Reason for Consultation:     Iron deficiency anemia, history of peptic ulcer disease        HPI:   Tammy Christian is a 37 y.o. female referred by Dr. Crecencio Mc, MD  for consultation & management of severe iron deficiency anemia and B12 deficiency. She has history of sleeve gastrectomy was performed in 2014. She went to emergency room on 04/28/2017 secondary to abdominal pain and vomiting switch was secondary to nephrolithiasis. At that time, she was incidentally found to have severe iron deficiency based on labs in 04/2017. Her MCV is 63.4. Repeat hemoglobin 9.6 on 05/15/2017. She is also found to have a 12 deficiency and ferritin of 5. Currently, patient is on B12 injections monthly, oral iron 325 mg once daily. she is taking Protonix40 mg in the morning, ranitidine at night.She denies having rectal bleeding, melena. She does have upper abdominal discomfort. 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 Abnormal Pap smear of cervix 2012  . Abortion 2001  . Allergy   . Anxiety    panic disorder  . Arthritis   . Asthma   . Gastritis 2005  . Hyperlipidemia   . Kidney stone    multiple  . Migraines   . Obesity   . Panic disorder   . Vitamin D deficiency disease     Past Surgical  History:  Procedure Laterality Date  . BREAST BIOPSY Right 02/04/2001   Negative c Clip  . CHOLECYSTECTOMY  04/08/2012  . COLPOSCOPY  2012  . KNEE ARTHROSCOPY Left 05/01/2016   Procedure: ARTHROSCOPY KNEE LATERAL RELEASE;  Surgeon: Hessie Knows, MD;  Location: [facility] ORS;  Service: Orthopedics;  Laterality: Left;  . laprascopic sleeve  04/08/2012   GASTRIC  . SPINAL FUSION  09/25/2016    Current Outpatient Medications:  .  albuterol (PROVENTIL HFA;VENTOLIN HFA) 108 (90 BASE) MCG/ACT inhaler, Inhale 2 puffs into the lungs every 4 (four) hours as needed. (Patient taking differently: Inhale 2 puffs into the lungs every 4 (four) hours as needed for wheezing or shortness of breath. ), Disp: 3 Inhaler, Rfl: 1 .  alprazolam (XANAX) 2 MG tablet, TAKE 1 TABLET BY MOUTH ONCE DAILY AS NEEDED, Disp: 30 tablet, Rfl: 3 .  Azelastine-Fluticasone (DYMISTA) 137-50 MCG/ACT SUSP, PLACE 1 SPRAY INTO THE NOSE 2 TIMES DAILY AS NEEDED. (Patient taking differently: Place 1 spray into the nose daily as needed. ), Disp: 69 g, Rfl: 11 .  bimatoprost (LATISSE) 0.03 % ophthalmic solution, , Disp: , Rfl: 6 .  celecoxib (CELEBREX) 200 MG capsule, Take 1 capsule (200 mg total) by mouth 2 (two) times daily. As needed for pain, Disp: 60 capsule, Rfl: 2 .  Clindamycin-Benzoyl Per, Refr, gel, APPLY TO THE AFFECTED AREA(S) DAILY, Disp: 45 g, Rfl: 1 .  clonazePAM (KLONOPIN) 1 MG  tablet, Take 1 tablet (1 mg total) by mouth 2 (two) times daily., Disp: 60 tablet, Rfl: 5 .  cyanocobalamin (,VITAMIN B-12,) 1000 MCG/ML injection, 1000 mcg injected daily for 3 days,  Then weekly x 4 then monthly thereafter, Disp: 10 mL, Rfl: 0 .  cyclobenzaprine (FLEXERIL) 10 MG tablet, , Disp: , Rfl:  .  desloratadine (CLARINEX) 5 MG tablet, TAKE 1 TABLET (5 MG TOTAL) BY MOUTH DAILY., Disp: 90 tablet, Rfl: 3 .  famotidine (PEPCID) 20 MG tablet, Take 40 mg by mouth at bedtime., Disp: , Rfl:  .  ferrous sulfate 325 (65 FE) MG tablet, One tablet daily with  orange juice, Disp: 30 tablet, Rfl: 5 .  Liraglutide -Weight Management (SAXENDA) 18 MG/3ML SOPN, Inject 0.6 mg into the skin daily. Increase dose weekly as follows: Week 2: 1.2 mg daily ; Week 3: 1.8 mg daily; Week 4: 2.4 mg daily, Disp: 9 mL, Rfl: 0 .  methocarbamol (ROBAXIN) 500 MG tablet, , Disp: , Rfl: 1 .  metoprolol succinate (TOPROL-XL) 100 MG 24 hr tablet, TAKE 1 TABLET BY MOUTH DAILY. TAKE WITH OR IMMEDIATELY FOLLOWING A MEAL., Disp: 90 tablet, Rfl: 3 .  montelukast (SINGULAIR) 10 MG tablet, TAKE 1 TABLET (10 MG TOTAL) BY MOUTH AT BEDTIME., Disp: 90 tablet, Rfl: 3 .  Multiple Vitamins-Minerals (ADULT GUMMY PO), Take 2 tablets by mouth daily., Disp: , Rfl:  .  ondansetron (ZOFRAN) 4 MG tablet, Take 1 tablet (4 mg total) by mouth every 8 (eight) hours as needed for nausea or vomiting., Disp: 10 tablet, Rfl: 0 .  pantoprazole (PROTONIX) 40 MG tablet, TAKE 1 TABLET (40 MG TOTAL) BY MOUTH DAILY., Disp: 90 tablet, Rfl: 1 .  SUMAtriptan (IMITREX) 100 MG tablet, TAKE 1 TABLET (100 MG TOTAL) BY MOUTH DAILY AS NEEDED. (Patient taking differently: Take 100 mg by mouth daily as needed for migraine. ), Disp: 10 tablet, Rfl: 5 .  Syringe/Needle, Disp, (SYRINGE 3CC/25GX1") 25G X 1" 3 ML MISC, Use for b12 injections, Disp: 50 each, Rfl: 0 .  tamsulosin (FLOMAX) 0.4 MG CAPS capsule, Take 1 capsule (0.4 mg total) by mouth daily. (Patient taking differently: Take 0.4 mg by mouth daily. ), Disp: 30 capsule, Rfl: 0 .  traMADol (ULTRAM) 50 MG tablet, Take 1 tablet (50 mg total) by mouth every 6 (six) hours as needed., Disp: 120 tablet, Rfl: 5 .  Vitamin D, Ergocalciferol, (DRISDOL) 50000 units CAPS capsule, TAKE 1 CAPSULE BY MOUTH EVERY 7 DAYS ON SUNDAYS, Disp: 12 capsule, Rfl: 3   Family History  Problem Relation Age of Onset  . Cancer Maternal Grandmother        LUNG, THROAT  . Macular degeneration Maternal Grandmother   . Heart disease Paternal Grandmother   . Alzheimer's disease Paternal Grandmother     . Stroke Paternal Grandmother   . Cancer Paternal Grandfather        COLON  . Diabetes Father   . Heart disease Father        atrial fib  . Hyperlipidemia Father   . Hypertension Father   . Cancer Father 24       colon polyps  . Colon polyps Father      Social History   Tobacco Use  . Smoking status: Never Smoker  . Smokeless tobacco: Never Used  Substance Use Topics  . Alcohol use: Yes    Alcohol/week: 1.2 oz    Types: 2 Glasses of wine per week    Comment: socially  . Drug  use: No    Allergies as of 07/02/2017 - Review Complete 07/02/2017  Allergen Reaction Noted  . Other Hives and Rash 04/28/2014    Review of Systems:    All systems reviewed and negative except where noted in HPI.   Physical Exam:  No LMP recorded. (Menstrual status: IUD).   Vitals:   07/02/17 1656  BP: 110/72  Pulse: 69  Temp: 98.1 F (36.7 C)   General:   Alert,  Obese, Well-developed, well-nourished, pleasant and cooperative in NAD Head:  Normocephalic and atraumatic. Eyes:  Sclera clear, no icterus.   Conjunctiva pink. Ears:  Normal auditory acuity. Nose:  No deformity, discharge, or lesions. Mouth:  No deformity or lesions,oropharynx pink & moist. Neck:  Supple; no masses or thyromegaly. Lungs:  Respirations even and unlabored.  Clear throughout to auscultation.   No wheezes, crackles, or rhonchi. No acute distress. Heart:  Regular rate and rhythm; no murmurs, clicks, rubs, or gallops. Abdomen:  Normal bowel sounds. Soft, obese, mild epigastric tenderness and non-distended without masses, hepatosplenomegaly or hernias noted.  No guarding or rebound tenderness.   Rectal: Not performed Msk:  Symmetrical without gross deformities. Good, equal movement & strength bilaterally. Pulses:  Normal pulses noted. Extremities:  No clubbing or edema.  No cyanosis. Neurologic:  Alert and oriented x3;  grossly normal neurologically. Skin:  Intact without significant lesions or rashes. No  jaundice. Psych:  Alert and cooperative. Normal mood and affect.  Imaging Studies: reviewed  Assessment and Plan:   Tammy Christian is a 37 y.o. Caucasian female with morbid obesity, sleeve gastrectomy, history of peptic ulcer disease is seen in consultation for severe iron deficiency anemia and B12 deficiency anemia  - switched from pantoprazole to omeprazole 40 mg twice a day with history of peptic ulcer disease - Recheck CBC, ferritin, iron and TIBC, B12 levels,  - check antiparietal cell, anti-intrinsic factor antibodies, total IgA, TTG levels - Continue parenteral B12 injections - Continue oral iron, referral to hematology for iron infusion based on the ferritin levels - recommend EGD and colonoscopy  Follow up in 2 months   Cephas Darby, MD

## 2017-07-05 ENCOUNTER — Other Ambulatory Visit
Admission: RE | Admit: 2017-07-05 | Discharge: 2017-07-05 | Disposition: A | Discharge status: Home / Self Care | Payer: 59 | Source: Ambulatory Visit | Attending: Gastroenterology | Admitting: Gastroenterology

## 2017-07-05 DIAGNOSIS — D519 Vitamin B12 deficiency anemia, unspecified: Secondary | ICD-10-CM | POA: Insufficient documentation

## 2017-07-05 DIAGNOSIS — D500 Iron deficiency anemia secondary to blood loss (chronic): Secondary | ICD-10-CM | POA: Insufficient documentation

## 2017-07-05 LAB — CBC
HCT: 39.5 % (ref 35.0–47.0)
Hemoglobin: 13.1 g/dL (ref 12.0–16.0)
MCH: 23.7 pg — ABNORMAL LOW (ref 26.0–34.0)
MCHC: 33.2 g/dL (ref 32.0–36.0)
MCV: 71.5 fL — ABNORMAL LOW (ref 80.0–100.0)
Platelets: 425 10*3/uL (ref 150–440)
RBC: 5.52 MIL/uL — ABNORMAL HIGH (ref 3.80–5.20)
RDW: 25 % — ABNORMAL HIGH (ref 11.5–14.5)
WBC: 7.3 10*3/uL (ref 3.6–11.0)

## 2017-07-05 LAB — VITAMIN B12: Vitamin B-12: 405 pg/mL (ref 180–914)

## 2017-07-05 LAB — FERRITIN: Ferritin: 6 ng/mL — ABNORMAL LOW (ref 11–307)

## 2017-07-05 LAB — IRON AND TIBC
Iron: 32 ug/dL (ref 28–170)
Saturation Ratios: 6 % — ABNORMAL LOW (ref 10.4–31.8)
TIBC: 547 ug/dL — ABNORMAL HIGH (ref 250–450)
UIBC: 515 ug/dL

## 2017-07-06 LAB — INTRINSIC FACTOR ANTIBODIES: Intrinsic Factor: 1 AU/mL (ref 0.0–1.1)

## 2017-07-06 LAB — IGA: IgA: 182 mg/dL (ref 87–352)

## 2017-07-07 LAB — ANTI-PARIETAL ANTIBODY: Parietal Cell Antibody-IgG: 1.8 Units (ref 0.0–20.0)

## 2017-07-08 LAB — TISSUE TRANSGLUTAMINASE, IGA: Tissue Transglutaminase Ab, IgA: <2 U/mL (ref 0–3)

## 2017-07-09 ENCOUNTER — Other Ambulatory Visit: Payer: Self-pay | Admitting: Gastroenterology

## 2017-07-09 ENCOUNTER — Encounter: Payer: Self-pay | Admitting: Gastroenterology

## 2017-07-09 DIAGNOSIS — R12 Heartburn: Secondary | ICD-10-CM

## 2017-07-09 DIAGNOSIS — N200 Calculus of kidney: Secondary | ICD-10-CM | POA: Diagnosis not present

## 2017-07-09 MED ORDER — OMEPRAZOLE 40 MG PO CPDR: 40.0000 mg | DELAYED_RELEASE_CAPSULE | Freq: Two times a day (BID) | ORAL | 1 refills | Status: DC

## 2017-07-10 ENCOUNTER — Other Ambulatory Visit: Payer: 59

## 2017-07-10 DIAGNOSIS — N200 Calculus of kidney: Secondary | ICD-10-CM | POA: Diagnosis not present

## 2017-07-12 ENCOUNTER — Ambulatory Visit: Payer: 59 | Admitting: Anesthesiology

## 2017-07-12 ENCOUNTER — Ambulatory Visit
Admission: RE | Admit: 2017-07-12 | Discharge: 2017-07-12 | Disposition: A | Discharge status: Home / Self Care | Payer: 59 | Source: Ambulatory Visit | Attending: Gastroenterology | Admitting: Gastroenterology

## 2017-07-12 ENCOUNTER — Encounter
Admission: RE | Disposition: A | Discharge status: Home / Self Care | Payer: Self-pay | Source: Ambulatory Visit | Attending: Gastroenterology

## 2017-07-12 DIAGNOSIS — D509 Iron deficiency anemia, unspecified: Secondary | ICD-10-CM | POA: Diagnosis not present

## 2017-07-12 DIAGNOSIS — F419 Anxiety disorder, unspecified: Secondary | ICD-10-CM | POA: Insufficient documentation

## 2017-07-12 DIAGNOSIS — G43909 Migraine, unspecified, not intractable, without status migrainosus: Secondary | ICD-10-CM | POA: Insufficient documentation

## 2017-07-12 DIAGNOSIS — D500 Iron deficiency anemia secondary to blood loss (chronic): Secondary | ICD-10-CM

## 2017-07-12 DIAGNOSIS — F411 Generalized anxiety disorder: Secondary | ICD-10-CM | POA: Diagnosis not present

## 2017-07-12 DIAGNOSIS — Z79899 Other long term (current) drug therapy: Secondary | ICD-10-CM | POA: Insufficient documentation

## 2017-07-12 DIAGNOSIS — J45909 Unspecified asthma, uncomplicated: Secondary | ICD-10-CM | POA: Insufficient documentation

## 2017-07-12 DIAGNOSIS — K6389 Other specified diseases of intestine: Secondary | ICD-10-CM

## 2017-07-12 DIAGNOSIS — K2960 Other gastritis without bleeding: Secondary | ICD-10-CM | POA: Diagnosis not present

## 2017-07-12 DIAGNOSIS — Z9884 Bariatric surgery status: Secondary | ICD-10-CM | POA: Diagnosis not present

## 2017-07-12 DIAGNOSIS — I10 Essential (primary) hypertension: Secondary | ICD-10-CM | POA: Diagnosis not present

## 2017-07-12 DIAGNOSIS — E559 Vitamin D deficiency, unspecified: Secondary | ICD-10-CM | POA: Diagnosis not present

## 2017-07-12 DIAGNOSIS — Z8711 Personal history of peptic ulcer disease: Secondary | ICD-10-CM | POA: Insufficient documentation

## 2017-07-12 DIAGNOSIS — K2950 Unspecified chronic gastritis without bleeding: Secondary | ICD-10-CM | POA: Insufficient documentation

## 2017-07-12 DIAGNOSIS — E785 Hyperlipidemia, unspecified: Secondary | ICD-10-CM | POA: Diagnosis not present

## 2017-07-12 DIAGNOSIS — K219 Gastro-esophageal reflux disease without esophagitis: Secondary | ICD-10-CM | POA: Insufficient documentation

## 2017-07-12 HISTORY — PX: ESOPHAGOGASTRODUODENOSCOPY (EGD) WITH PROPOFOL: SHX5813

## 2017-07-12 HISTORY — PX: COLONOSCOPY WITH PROPOFOL: SHX5780

## 2017-07-12 LAB — POCT PREGNANCY, URINE: Preg Test, Ur: NEGATIVE

## 2017-07-12 SURGERY — ESOPHAGOGASTRODUODENOSCOPY (EGD) WITH PROPOFOL
Anesthesia: General

## 2017-07-12 MED ORDER — LIDOCAINE HCL (PF) 2 % IJ SOLN
INTRAMUSCULAR | Status: AC
  Filled 2017-07-12: qty 10

## 2017-07-12 MED ORDER — PROPOFOL 10 MG/ML IV BOLUS
INTRAVENOUS | Status: AC
  Filled 2017-07-12: qty 20

## 2017-07-12 MED ORDER — FENTANYL CITRATE (PF) 100 MCG/2ML IJ SOLN
INTRAMUSCULAR | Status: DC | PRN
  Administered 2017-07-12: 50 ug via INTRAVENOUS

## 2017-07-12 MED ORDER — SODIUM CHLORIDE 0.9 % IV SOLN
INTRAVENOUS | Status: DC
  Administered 2017-07-12: 1000 mL via INTRAVENOUS

## 2017-07-12 MED ORDER — MIDAZOLAM HCL 2 MG/2ML IJ SOLN
INTRAMUSCULAR | Status: AC
  Filled 2017-07-12: qty 2

## 2017-07-12 MED ORDER — LIDOCAINE HCL (CARDIAC) PF 100 MG/5ML IV SOSY
PREFILLED_SYRINGE | INTRAVENOUS | Status: DC | PRN
  Administered 2017-07-12: 100 mg via INTRATRACHEAL

## 2017-07-12 MED ORDER — EPHEDRINE SULFATE 50 MG/ML IJ SOLN
INTRAMUSCULAR | Status: DC | PRN
  Administered 2017-07-12: 5 mg via INTRAVENOUS

## 2017-07-12 MED ORDER — FENTANYL CITRATE (PF) 100 MCG/2ML IJ SOLN
INTRAMUSCULAR | Status: AC
  Filled 2017-07-12: qty 2

## 2017-07-12 MED ORDER — PROPOFOL 10 MG/ML IV BOLUS
INTRAVENOUS | Status: DC | PRN
  Administered 2017-07-12: 50 mg via INTRAVENOUS
  Administered 2017-07-12: 10 mg via INTRAVENOUS
  Administered 2017-07-12: 20 mg via INTRAVENOUS

## 2017-07-12 MED ORDER — PROPOFOL 500 MG/50ML IV EMUL
INTRAVENOUS | Status: AC
  Filled 2017-07-12: qty 50

## 2017-07-12 MED ORDER — MIDAZOLAM HCL 2 MG/2ML IJ SOLN
INTRAMUSCULAR | Status: DC | PRN
  Administered 2017-07-12 (×2): 1 mg via INTRAVENOUS

## 2017-07-12 NOTE — Transfer of Care (Addendum)
Immediate Anesthesia Transfer of Care Note  Patient: Tammy Christian  Procedure(s) Performed: ESOPHAGOGASTRODUODENOSCOPY (EGD) WITH PROPOFOL (N/A ) COLONOSCOPY WITH PROPOFOL (N/A )  Patient Location: PACU  Anesthesia Type:General  Level of Consciousness: awake  Airway & Oxygen Therapy: Patient Spontanous Breathing  Post-op Assessment: Report given to RN  Post vital signs: Reviewed  Last Vitals:  Vitals Value Taken Time  BP    Temp 36 C 07/12/2017 11:22 AM  Pulse 116 07/12/2017 11:23 AM  Resp 21 07/12/2017 11:23 AM  SpO2 100 % 07/12/2017 11:23 AM  Vitals shown include unvalidated device data.  Last Pain:  Vitals:   07/12/17 0949  TempSrc: Tympanic  PainSc: 0-No pain         Complications: No apparent anesthesia complications

## 2017-07-12 NOTE — Op Note (Signed)
[facility] Gastroenterology Patient Name: Tammy Christian Procedure Date: 07/12/2017 10:35 AM MRN: [id number] Account #: [id number] Date of Birth: 17-Dec-1980 Admit Type: Outpatient Age: 37 Room: [facility] ENDO ROOM 2 Gender: Female Note Status: Finalized Procedure:            Upper GI endoscopy Indications:          Unexplained iron deficiency anemia, Personal history of                        peptic ulcer disease Providers:            Lin Landsman MD, MD Referring MD:         Deborra Medina, MD (Referring MD) Medicines:            Monitored Anesthesia Care Complications:        No immediate complications. Estimated blood loss: None. Procedure:            Pre-Anesthesia Assessment:                       - Prior to the procedure, a History and Physical was                        performed, and patient medications and allergies were                        reviewed. The patient is competent. The risks and                        benefits of the procedure and the sedation options and                        risks were discussed with the patient. All questions                        were answered and informed consent was obtained.                        Patient identification and proposed procedure were                        verified by the physician, the nurse, the                        anesthesiologist, the anesthetist and the technician in                        the pre-procedure area in the procedure room in the                        endoscopy suite. Mental Status Examination: alert and                        oriented. Airway Examination: normal oropharyngeal                        airway and neck mobility. Respiratory Examination:                        clear to auscultation. CV Examination: normal.  Prophylactic Antibiotics: The patient does not require                        prophylactic antibiotics. Prior Anticoagulants: The                patient has taken no previous anticoagulant or                        antiplatelet agents. ASA Grade Assessment: III - A                        patient with severe systemic disease. After reviewing                        the risks and benefits, the patient was deemed in                        satisfactory condition to undergo the procedure. The                        anesthesia plan was to use monitored anesthesia care                        (MAC). Immediately prior to administration of                        medications, the patient was re-assessed for adequacy                        to receive sedatives. The heart rate, respiratory rate,                        oxygen saturations, blood pressure, adequacy of                        pulmonary ventilation, and response to care were                        monitored throughout the procedure. The physical status                        of the patient was re-assessed after the procedure.                       After obtaining informed consent, the endoscope was                        passed under direct vision. Throughout the procedure,                        the patient's blood pressure, pulse, and oxygen                        saturations were monitored continuously. The Endoscope                        was introduced through the mouth, and advanced to the                        second part of duodenum. The upper  GI endoscopy was                        accomplished without difficulty. The patient tolerated                        the procedure well. The upper GI endoscopy was                        accomplished without difficulty. Findings:      The gastroesophageal junction and examined esophagus were normal.      Evidence of a sleeve gastrectomy was found in the gastric body. This was       characterized by healthy appearing mucosa. Biopsies were taken with a       cold forceps for Helicobacter pylori testing.      The duodenal  bulb and second portion of the duodenum were normal.       Biopsies for histology were taken with a cold forceps for evaluation of       celiac disease. Impression:           - Normal gastroesophageal junction and esophagus.                       - A sleeve gastrectomy was found, characterized by                        healthy appearing mucosa. Biopsied.                       - Normal duodenal bulb and second portion of the                        duodenum. Biopsied. Recommendation:       - Await pathology results.                       - Proceed with colonoscopy as scheduled                       See colonoscopy report Procedure Code(s):    --- Professional ---                       [phone], Esophagogastroduodenoscopy, flexible, transoral;                        with biopsy, single or multiple Diagnosis Code(s):    --- Professional ---                       H21.97, Bariatric surgery status                       D50.9, Iron deficiency anemia, unspecified                       Z87.11, Personal history of peptic ulcer disease CPT copyright 2017 American Medical Association. All rights reserved. The codes documented in this report are preliminary and upon coder review may  be revised to meet current compliance requirements. Dr. Ulyess Mort Lin Landsman MD, MD 07/12/2017 11:01:39 AM This report has been signed electronically. Number of Addenda: 0 Note Initiated On: 07/12/2017 10:35 AM  [facility]

## 2017-07-12 NOTE — H&P (Signed)
Cephas Darby, MD [address]  [place]  [place], [place] [address]  Main: [phone]  Fax: [phone] Pager: [phone]  Primary Care Physician:  Crecencio Mc, MD Primary Gastroenterologist:  Dr. Cephas Darby  Pre-Procedure History & Physical: HPI:  Tammy Christian is a 37 y.o. female is here for an endoscopy and colonoscopy.   Past Medical History:  Diagnosis Date  . Abnormal Pap smear of cervix 2012  . Abortion 2001  . Allergy   . Anxiety    panic disorder  . Arthritis   . Asthma   . Gastritis 2005  . Hyperlipidemia   . Kidney stone    multiple  . Migraines   . Obesity   . Panic disorder   . Vitamin D deficiency disease     Past Surgical History:  Procedure Laterality Date  . BREAST BIOPSY Right 02/04/2001   Negative c Clip  . CHOLECYSTECTOMY  04/08/2012  . COLPOSCOPY  2012  . KNEE ARTHROSCOPY Left 05/01/2016   Procedure: ARTHROSCOPY KNEE LATERAL RELEASE;  Surgeon: Hessie Knows, MD;  Location: [facility] ORS;  Service: Orthopedics;  Laterality: Left;  . laprascopic sleeve  04/08/2012   GASTRIC  . SPINAL FUSION  09/25/2016    Prior to Admission medications   Medication Sig Start Date End Date Taking? Authorizing Provider  albuterol (PROVENTIL HFA;VENTOLIN HFA) 108 (90 BASE) MCG/ACT inhaler Inhale 2 puffs into the lungs every 4 (four) hours as needed. Patient taking differently: Inhale 2 puffs into the lungs every 4 (four) hours as needed for wheezing or shortness of breath.  08/10/14  Yes Crecencio Mc, MD  alprazolam Duanne Moron) 2 MG tablet TAKE 1 TABLET BY MOUTH ONCE DAILY AS NEEDED 03/19/17  Yes Crecencio Mc, MD  Azelastine-Fluticasone (DYMISTA) 137-50 MCG/ACT SUSP PLACE 1 SPRAY INTO THE NOSE 2 TIMES DAILY AS NEEDED. Patient taking differently: Place 1 spray into the nose daily as needed.  03/02/16  Yes Crecencio Mc, MD  bimatoprost (LATISSE) 0.03 % ophthalmic solution  12/22/16  Yes [provider]  celecoxib (CELEBREX) 200 MG  capsule Take 1 capsule (200 mg total) by mouth 2 (two) times daily. As needed for pain 01/08/17  Yes Crecencio Mc, MD  Clindamycin-Benzoyl Per, Refr, gel APPLY TO THE AFFECTED AREA(S) DAILY 02/10/17  Yes Crecencio Mc, MD  clonazePAM (KLONOPIN) 1 MG tablet Take 1 tablet (1 mg total) by mouth 2 (two) times daily. 05/30/17  Yes Crecencio Mc, MD  cyanocobalamin (,VITAMIN B-12,) 1000 MCG/ML injection 1000 mcg injected daily for 3 days,  Then weekly x 4 then monthly thereafter 05/21/17  Yes Crecencio Mc, MD  cyclobenzaprine (FLEXERIL) 10 MG tablet  12/25/16  Yes [provider]  desloratadine (CLARINEX) 5 MG tablet TAKE 1 TABLET (5 MG TOTAL) BY MOUTH DAILY. 09/18/16  Yes Crecencio Mc, MD  ferrous sulfate 325 (65 FE) MG tablet One tablet daily with orange juice 05/16/17  Yes Crecencio Mc, MD  Liraglutide -Weight Management (SAXENDA) 18 MG/3ML SOPN Inject 0.6 mg into the skin daily. Increase dose weekly as follows: Week 2: 1.2 mg daily ; Week 3: 1.8 mg daily; Week 4: 2.4 mg daily 05/15/17  Yes Crecencio Mc, MD  methocarbamol (ROBAXIN) 500 MG tablet  05/30/17  Yes [provider]  metoprolol succinate (TOPROL-XL) 100 MG 24 hr tablet TAKE 1 TABLET BY MOUTH DAILY. TAKE WITH OR IMMEDIATELY FOLLOWING A MEAL. 05/30/17  Yes Crecencio Mc, MD  montelukast (SINGULAIR) 10  MG tablet TAKE 1 TABLET (10 MG TOTAL) BY MOUTH AT BEDTIME. 09/18/16  Yes Crecencio Mc, MD  Multiple Vitamins-Minerals (ADULT GUMMY PO) Take 2 tablets by mouth daily.   Yes [provider]  omeprazole (PRILOSEC) 40 MG capsule Take 1 capsule (40 mg total) by mouth 2 (two) times daily before a meal. 07/09/17 01/05/18 Yes Monti Jilek, Tally Due, MD  ondansetron (ZOFRAN) 4 MG tablet Take 1 tablet (4 mg total) by mouth every 8 (eight) hours as needed for nausea or vomiting. 06/19/17  Yes Hollice Espy, MD  SUMAtriptan (IMITREX) 100 MG tablet TAKE 1 TABLET (100 MG TOTAL) BY MOUTH DAILY AS NEEDED. Patient taking  differently: Take 100 mg by mouth daily as needed for migraine.  08/10/16  Yes Crecencio Mc, MD  Syringe/Needle, Disp, (SYRINGE 3CC/25GX1") 25G X 1" 3 ML MISC Use for b12 injections 05/21/17  Yes Crecencio Mc, MD  tamsulosin (FLOMAX) 0.4 MG CAPS capsule Take 1 capsule (0.4 mg total) by mouth daily. Patient taking differently: Take 0.4 mg by mouth daily.  06/19/17  Yes Hollice Espy, MD  traMADol (ULTRAM) 50 MG tablet Take 1 tablet (50 mg total) by mouth every 6 (six) hours as needed. 05/30/17  Yes Crecencio Mc, MD  Vitamin D, Ergocalciferol, (DRISDOL) 50000 units CAPS capsule TAKE 1 CAPSULE BY MOUTH EVERY 7 DAYS ON SUNDAYS 05/21/17  Yes Crecencio Mc, MD    Allergies as of 07/02/2017 - Review Complete 07/02/2017  Allergen Reaction Noted  . Other Hives and Rash 04/28/2014    Family History  Problem Relation Age of Onset  . Cancer Maternal Grandmother        LUNG, THROAT  . Macular degeneration Maternal Grandmother   . Heart disease Paternal Grandmother   . Alzheimer's disease Paternal Grandmother   . Stroke Paternal Grandmother   . Cancer Paternal Grandfather        COLON  . Diabetes Father   . Heart disease Father        atrial fib  . Hyperlipidemia Father   . Hypertension Father   . Cancer Father 35       colon polyps  . Colon polyps Father     Social History   Socioeconomic History  . Marital status: Single    Spouse name: Not on file  . Number of children: 0  . Years of education: 1  . Highest education level: Not on file  Occupational History  . Occupation: PHY ASSISTANT    Comment: [employer]  Social Needs  . Financial resource strain: Not on file  . Food insecurity:    Worry: Not on file    Inability: Not on file  . Transportation needs:    Medical: Not on file    Non-medical: Not on file  Tobacco Use  . Smoking status: Never Smoker  . Smokeless tobacco: Never Used  Substance and Sexual Activity  . Alcohol use: Yes    Alcohol/week:  1.2 oz    Types: 2 Glasses of wine per week    Comment: socially  . Drug use: No  . Sexual activity: Yes    Birth control/protection: Implant  Lifestyle  . Physical activity:    Days per week: Not on file    Minutes per session: Not on file  . Stress: Not on file  Relationships  . Social connections:    Talks on phone: Not on file    Gets together: Not on file  Attends religious service: Not on file    Active member of club or organization: Not on file    Attends meetings of clubs or organizations: Not on file    Relationship status: Not on file  . Intimate partner violence:    Fear of current or ex partner: Not on file    Emotionally abused: Not on file    Physically abused: Not on file    Forced sexual activity: Not on file  Other Topics Concern  . Not on file  Social History Narrative  . Not on file    Review of Systems: See HPI, otherwise negative ROS  Physical Exam: BP 132/90   Pulse 93   Temp (!) 96.5 F (35.8 C) (Tympanic)   Resp 20   Ht 5\' 7"  (1.702 m)   Wt 240 lb (108.9 kg)   SpO2 100%   BMI 37.59 kg/m  General:   Alert,  pleasant and cooperative in NAD Head:  Normocephalic and atraumatic. Neck:  Supple; no masses or thyromegaly. Lungs:  Clear throughout to auscultation.    Heart:  Regular rate and rhythm. Abdomen:  Soft, nontender and nondistended. Normal bowel sounds, without guarding, and without rebound.   Neurologic:  Alert and  oriented x4;  grossly normal neurologically.  Impression/Plan: [name] is here for an endoscopy and colonoscopy to be performed for IDA, PUD  Risks, benefits, limitations, and alternatives regarding  endoscopy and colonoscopy have been reviewed with the patient.  Questions have been answered.  All parties agreeable.   Sherri Sear, MD  07/12/2017, 9:58 AM

## 2017-07-12 NOTE — Anesthesia Post-op Follow-up Note (Signed)
Anesthesia QCDR form completed.        

## 2017-07-12 NOTE — Op Note (Signed)
[facility] Gastroenterology Patient Name: Tammy Christian Procedure Date: 07/12/2017 10:34 AM MRN: [id number] Account #: [id number] Date of Birth: 1981/02/14 Admit Type: Outpatient Age: 37 Room: [facility] ENDO ROOM 2 Gender: Female Note Status: Finalized Procedure:            Colonoscopy Indications:          Unexplained iron deficiency anemia Providers:            Lin Landsman MD, MD Referring MD:         Deborra Medina, MD (Referring MD) Medicines:            Monitored Anesthesia Care Complications:        No immediate complications. Estimated blood loss: None. Procedure:            Pre-Anesthesia Assessment:                       - Prior to the procedure, a History and Physical was                        performed, and patient medications and allergies were                        reviewed. The patient is competent. The risks and                        benefits of the procedure and the sedation options and                        risks were discussed with the patient. All questions                        were answered and informed consent was obtained.                        Patient identification and proposed procedure were                        verified by the physician, the nurse, the                        anesthesiologist, the anesthetist and the technician in                        the pre-procedure area in the procedure room in the                        endoscopy suite. Mental Status Examination: alert and                        oriented. Airway Examination: normal oropharyngeal                        airway and neck mobility. Respiratory Examination:                        clear to auscultation. CV Examination: normal.                        Prophylactic Antibiotics: The patient does not require  prophylactic antibiotics. Prior Anticoagulants: The                        patient has taken no previous anticoagulant or              antiplatelet agents. ASA Grade Assessment: III - A                        patient with severe systemic disease. After reviewing                        the risks and benefits, the patient was deemed in                        satisfactory condition to undergo the procedure. The                        anesthesia plan was to use monitored anesthesia care                        (MAC). Immediately prior to administration of                        medications, the patient was re-assessed for adequacy                        to receive sedatives. The heart rate, respiratory rate,                        oxygen saturations, blood pressure, adequacy of                        pulmonary ventilation, and response to care were                        monitored throughout the procedure. The physical status                        of the patient was re-assessed after the procedure.                       After obtaining informed consent, the colonoscope was                        passed under direct vision. Throughout the procedure,                        the patient's blood pressure, pulse, and oxygen                        saturations were monitored continuously. The                        Colonoscope was introduced through the anus and                        advanced to the the terminal ileum. The colonoscopy was                        performed without difficulty. The patient tolerated the  procedure well. The quality of the bowel preparation                        was evaluated using the BBPS [facility] Bowel Preparation                        Scale) with scores of: Right Colon = 3, Transverse                        Colon = 3 and Left Colon = 3 (entire mucosa seen well                        with no residual staining, small fragments of stool or                        opaque liquid). The total BBPS score equals 9. Findings:      The perianal and digital rectal examinations  were normal. Pertinent       negatives include normal sphincter tone and no palpable rectal lesions.      A localized area of mucosa in the terminal ileum was mildly       erythematous. Biopsies were taken with a cold forceps for histology.      The colon (entire examined portion) appeared normal.      The retroflexed view of the distal rectum and anal verge was normal and       showed no anal or rectal abnormalities. Impression:           - Erythematous mucosa in the terminal ileum. Biopsied.                       - The entire examined colon is normal.                       - The distal rectum and anal verge are normal on                        retroflexion view. Recommendation:       - Discharge patient to home.                       - Resume previous diet today.                       - Continue present medications.                       - Await pathology results.                       - Repeat colonoscopy at age 64 for surveillance.                       - Return to my office as previously scheduled. Procedure Code(s):    --- Professional ---                       [phone], Colonoscopy, flexible; with biopsy, single or                        multiple Diagnosis Code(s):    --- Professional ---  K63.89, Other specified diseases of intestine                       D50.9, Iron deficiency anemia, unspecified CPT copyright 2017 American Medical Association. All rights reserved. The codes documented in this report are preliminary and upon coder review may  be revised to meet current compliance requirements. Dr. Ulyess Mort Lin Landsman MD, MD 07/12/2017 11:20:04 AM This report has been signed electronically. Number of Addenda: 0 Note Initiated On: 07/12/2017 10:34 AM Scope Withdrawal Time: 0 hours 9 minutes 47 seconds  Total Procedure Duration: 0 hours 13 minutes 36 seconds       [facility]

## 2017-07-12 NOTE — Anesthesia Preprocedure Evaluation (Signed)
Anesthesia Evaluation  Patient identified by MRN, date of birth, ID band Patient awake    Reviewed: Allergy & Precautions, NPO status , Patient's Chart, lab work & pertinent test results  History of Anesthesia Complications Negative for: history of anesthetic complications  Airway Mallampati: II       Dental   Pulmonary asthma , neg sleep apnea, neg COPD,           Cardiovascular hypertension, Pt. on medications and Pt. on home beta blockers (-) Past MI and (-) CHF (-) dysrhythmias (-) Valvular Problems/Murmurs     Neuro/Psych neg Seizures Anxiety    GI/Hepatic Neg liver ROS, GERD  Medicated and Poorly Controlled,  Endo/Other  neg diabetes  Renal/GU Renal disease (stones)     Musculoskeletal   Abdominal   Peds  Hematology  (+) anemia ,   Anesthesia Other Findings   Reproductive/Obstetrics                             Anesthesia Physical Anesthesia Plan  ASA: III  Anesthesia Plan: General   Post-op Pain Management:    Induction: Intravenous  PONV Risk Score and Plan: 3 and Propofol infusion, TIVA and Treatment may vary due to age or medical condition  Airway Management Planned: Nasal Cannula  Additional Equipment:   Intra-op Plan:   Post-operative Plan:   Informed Consent: I have reviewed the patients History and Physical, chart, labs and discussed the procedure including the risks, benefits and alternatives for the proposed anesthesia with the patient or authorized representative who has indicated his/her understanding and acceptance.     Plan Discussed with:   Anesthesia Plan Comments:         Anesthesia Quick Evaluation

## 2017-07-12 NOTE — Anesthesia Postprocedure Evaluation (Signed)
Anesthesia Post Note  Patient: Tammy Christian  Procedure(s) Performed: ESOPHAGOGASTRODUODENOSCOPY (EGD) WITH PROPOFOL (N/A ) COLONOSCOPY WITH PROPOFOL (N/A )  Patient location during evaluation: Endoscopy Anesthesia Type: General Level of consciousness: awake and alert Pain management: pain level controlled Vital Signs Assessment: post-procedure vital signs reviewed and stable Respiratory status: spontaneous breathing and respiratory function stable Cardiovascular status: stable Anesthetic complications: no     Last Vitals:  Vitals:   07/12/17 1122 07/12/17 1123  BP:  106/68  Pulse: (!) 106 (!) 109  Resp: (!) 28 (!) 21  Temp: (!) 36 C (!) 36 C  SpO2: 100% 100%    Last Pain:  Vitals:   07/12/17 1123  TempSrc: Tympanic  PainSc: 0-No pain                 KEPHART,WILLIAM K

## 2017-07-13 ENCOUNTER — Encounter: Payer: Self-pay | Admitting: Gastroenterology

## 2017-07-13 LAB — SURGICAL PATHOLOGY

## 2017-07-16 ENCOUNTER — Other Ambulatory Visit: Payer: Self-pay | Admitting: Urology

## 2017-07-26 ENCOUNTER — Encounter: Payer: Self-pay | Admitting: Urology

## 2017-07-30 ENCOUNTER — Other Ambulatory Visit: Payer: Self-pay | Admitting: Internal Medicine

## 2017-07-30 NOTE — Telephone Encounter (Signed)
Refilled: 03/19/2017 Last OV: 05/15/2017 Next OV:  08/14/2017

## 2017-08-01 NOTE — Telephone Encounter (Signed)
Printed, signed and faxed.  

## 2017-08-14 ENCOUNTER — Ambulatory Visit: Payer: 59 | Admitting: Internal Medicine

## 2017-08-14 ENCOUNTER — Encounter: Payer: Self-pay | Admitting: Internal Medicine

## 2017-08-14 VITALS — BP 114/76 | HR 77 | Temp 98.1°F | Resp 15 | Ht 67.0 in | Wt 229.2 lb

## 2017-08-14 DIAGNOSIS — E559 Vitamin D deficiency, unspecified: Secondary | ICD-10-CM | POA: Diagnosis not present

## 2017-08-14 DIAGNOSIS — R12 Heartburn: Secondary | ICD-10-CM | POA: Diagnosis not present

## 2017-08-14 DIAGNOSIS — F411 Generalized anxiety disorder: Secondary | ICD-10-CM | POA: Diagnosis not present

## 2017-08-14 DIAGNOSIS — D509 Iron deficiency anemia, unspecified: Secondary | ICD-10-CM

## 2017-08-14 DIAGNOSIS — D520 Dietary folate deficiency anemia: Secondary | ICD-10-CM | POA: Diagnosis not present

## 2017-08-14 DIAGNOSIS — Z6841 Body Mass Index (BMI) 40.0 and over, adult: Secondary | ICD-10-CM | POA: Diagnosis not present

## 2017-08-14 MED ORDER — ALPRAZOLAM 2 MG PO TABS: 2.0000 mg | ORAL_TABLET | Freq: Every day | ORAL | 5 refills | Status: DC | PRN

## 2017-08-14 MED ORDER — LIRAGLUTIDE -WEIGHT MANAGEMENT 18 MG/3ML ~~LOC~~ SOPN: 3.0000 mg | PEN_INJECTOR | Freq: Every day | SUBCUTANEOUS | 11 refills | Status: DC

## 2017-08-14 MED ORDER — OMEPRAZOLE 40 MG PO CPDR: 40.0000 mg | DELAYED_RELEASE_CAPSULE | Freq: Two times a day (BID) | ORAL | 5 refills | Status: DC

## 2017-08-14 NOTE — Progress Notes (Signed)
Subjective:  Patient ID: Tammy Christian, female    DOB: [birth year]  Age: 37 y.o. MRN: [id number]  CC: The primary encounter diagnosis was Iron deficiency anemia, unspecified iron deficiency anemia type. Diagnoses of Dietary folate deficiency anemia, Heartburn, Vitamin D deficiency, Class 3 severe obesity due to excess calories without serious comorbidity with body mass index (BMI) of 40.0 to 44.9 in adult [facility]), and Generalized anxiety disorder were also pertinent to this visit.  HPI Tammy Christian presents for 3 month follow up on obeisty,  GAD and IDA   Recurrent kidney stones:  Gave patient litholink  results  .   No recurrent stones attacks since February   Had EGD and colon for IDA .  Weight is down 30 lb since Feb  Taking Saxenda No gastritis seen    Outpatient Medications Prior to Visit  Medication Sig Dispense Refill  . albuterol (PROVENTIL HFA;VENTOLIN HFA) 108 (90 BASE) MCG/ACT inhaler Inhale 2 puffs into the lungs every 4 (four) hours as needed. (Patient taking differently: Inhale 2 puffs into the lungs every 4 (four) hours as needed for wheezing or shortness of breath. ) 3 Inhaler 1  . Azelastine-Fluticasone (DYMISTA) 137-50 MCG/ACT SUSP PLACE 1 SPRAY INTO THE NOSE 2 TIMES DAILY AS NEEDED. (Patient taking differently: Place 1 spray into the nose daily as needed. ) 69 g 11  . bimatoprost (LATISSE) 0.03 % ophthalmic solution   6  . celecoxib (CELEBREX) 200 MG capsule Take 1 capsule (200 mg total) by mouth 2 (two) times daily. As needed for pain 60 capsule 2  . Clindamycin-Benzoyl Per, Refr, gel APPLY TO THE AFFECTED AREA(S) DAILY 45 g 1  . clonazePAM (KLONOPIN) 1 MG tablet Take 1 tablet (1 mg total) by mouth 2 (two) times daily. 60 tablet 5  . cyanocobalamin (,VITAMIN B-12,) 1000 MCG/ML injection 1000 mcg injected daily for 3 days,  Then weekly x 4 then monthly thereafter 10 mL 0  . desloratadine (CLARINEX) 5 MG tablet TAKE 1 TABLET (5 MG TOTAL) BY MOUTH DAILY. 90  tablet 3  . ferrous sulfate 325 (65 FE) MG tablet One tablet daily with orange juice 30 tablet 5  . methocarbamol (ROBAXIN) 500 MG tablet   1  . metoprolol succinate (TOPROL-XL) 100 MG 24 hr tablet TAKE 1 TABLET BY MOUTH DAILY. TAKE WITH OR IMMEDIATELY FOLLOWING A MEAL. 90 tablet 3  . montelukast (SINGULAIR) 10 MG tablet TAKE 1 TABLET (10 MG TOTAL) BY MOUTH AT BEDTIME. 90 tablet 3  . Multiple Vitamins-Minerals (ADULT GUMMY PO) Take 2 tablets by mouth daily.    . ondansetron (ZOFRAN) 4 MG tablet Take 1 tablet (4 mg total) by mouth every 8 (eight) hours as needed for nausea or vomiting. 10 tablet 0  . SUMAtriptan (IMITREX) 100 MG tablet TAKE 1 TABLET (100 MG TOTAL) BY MOUTH DAILY AS NEEDED. (Patient taking differently: Take 100 mg by mouth daily as needed for migraine. ) 10 tablet 5  . Syringe/Needle, Disp, (SYRINGE 3CC/25GX1") 25G X 1" 3 ML MISC Use for b12 injections 50 each 0  . tamsulosin (FLOMAX) 0.4 MG CAPS capsule Take 1 capsule (0.4 mg total) by mouth daily. (Patient taking differently: Take 0.4 mg by mouth daily. ) 30 capsule 0  . traMADol (ULTRAM) 50 MG tablet Take 1 tablet (50 mg total) by mouth every 6 (six) hours as needed. 120 tablet 5  . Vitamin D, Ergocalciferol, (DRISDOL) 50000 units CAPS capsule TAKE 1 CAPSULE BY MOUTH EVERY 7 DAYS ON SUNDAYS 12  capsule 3  . alprazolam (XANAX) 2 MG tablet TAKE 1 TABLET BY MOUTH ONCE DAILY AS NEEDED 30 tablet 0  . Liraglutide -Weight Management (SAXENDA) 18 MG/3ML SOPN Inject 0.6 mg into the skin daily. Increase dose weekly as follows: Week 2: 1.2 mg daily ; Week 3: 1.8 mg daily; Week 4: 2.4 mg daily 9 mL 0  . omeprazole (PRILOSEC) 40 MG capsule Take 1 capsule (40 mg total) by mouth 2 (two) times daily before a meal. 180 capsule 1  . cyclobenzaprine (FLEXERIL) 10 MG tablet      No facility-administered medications prior to visit.     Review of Systems;  Patient denies headache, fevers, malaise, unintentional weight loss, skin rash, eye pain,  sinus congestion and sinus pain, sore throat, dysphagia,  hemoptysis , cough, dyspnea, wheezing, chest pain, palpitations, orthopnea, edema, abdominal pain, nausea, melena, diarrhea, constipation, flank pain, dysuria, hematuria, urinary  Frequency, nocturia, numbness, tingling, seizures,  Focal weakness, Loss of consciousness,  Tremor, insomnia, depression, anxiety, and suicidal ideation.      Objective:  BP 114/76 (BP Location: Right Arm, Patient Position: Sitting, Cuff Size: Large)   Pulse 77   Temp 98.1 F (36.7 C) (Oral)   Resp 15   Ht 5\' 7"  (1.702 m)   Wt 229 lb 3.2 oz (104 kg)   SpO2 98%   BMI 35.90 kg/m   BP Readings from Last 3 Encounters:  08/14/17 114/76  07/12/17 (!) 116/53  07/02/17 110/72    Wt Readings from Last 3 Encounters:  08/14/17 229 lb 3.2 oz (104 kg)  07/12/17 240 lb (108.9 kg)  07/02/17 245 lb 3.2 oz (111.2 kg)    General appearance: alert, cooperative and appears stated age Ears: normal TM's and external ear canals both ears Throat: lips, mucosa, and tongue normal; teeth and gums normal Neck: no adenopathy, no carotid bruit, supple, symmetrical, trachea midline and thyroid not enlarged, symmetric, no tenderness/mass/nodules Back: symmetric, no curvature. ROM normal. No CVA tenderness. Lungs: clear to auscultation bilaterally Heart: regular rate and rhythm, S1, S2 normal, no murmur, click, rub or gallop Abdomen: soft, non-tender; bowel sounds normal; no masses,  no organomegaly Pulses: 2+ and symmetric Skin: Skin color, texture, turgor normal. No rashes or lesions Lymph nodes: Cervical, supraclavicular, and axillary nodes normal.  Lab Results  Component Value Date   HGBA1C 5.8 05/15/2017   HGBA1C 5.4 04/29/2014    Lab Results  Component Value Date   CREATININE 0.76 08/15/2017   CREATININE 0.60 05/15/2017   CREATININE 0.76 04/28/2017    Lab Results  Component Value Date   WBC 7.1 08/15/2017   HGB 13.4 08/15/2017   HCT 41.4 08/15/2017    PLT 346.0 08/15/2017   GLUCOSE 103 (H) 08/15/2017   CHOL 214 (H) 05/15/2017   TRIG 161.0 (H) 05/15/2017   HDL 52.50 05/15/2017   LDLCALC 129 (H) 05/15/2017   ALT 28 08/15/2017   AST 25 08/15/2017   NA 141 08/15/2017   K 3.3 (L) 08/15/2017   CL 103 08/15/2017   CREATININE 0.76 08/15/2017   BUN 8 08/15/2017   CO2 26 08/15/2017   TSH 1.36 05/15/2017   HGBA1C 5.8 05/15/2017    No results found.  Assessment & Plan:   Problem List Items Addressed This Visit    Vitamin D deficiency   Relevant Orders   VITAMIN D 25 Hydroxy (Vit-D Deficiency, Fractures) (Completed)   Obesity    She has lost 30 lgs since starting Saxenda. .continue medication  Relevant Medications   Liraglutide -Weight Management (SAXENDA) 18 MG/3ML SOPN   Iron deficiency anemia - Primary    With history of menorrhagia,.now resolved.   Lab Results  Component Value Date   WBC 7.1 08/15/2017   HGB 13.4 08/15/2017   HCT 41.4 08/15/2017   MCV 76.8 (L) 08/15/2017   PLT 346.0 08/15/2017    . Lab Results  Component Value Date   IRON 34 (L) 08/15/2017   TIBC 381 08/15/2017   FERRITIN 11 08/15/2017        Relevant Orders   CBC with Differential/Platelet   Iron, TIBC and Ferritin Panel (Completed)   CBC with Differential/Platelet (Completed)   Generalized anxiety disorder    With improved symptoms since resuming temazepam.  Trial of valium.. Not receptive to SSRI trial due to past lack of success       Relevant Medications   alprazolam (XANAX) 2 MG tablet    Other Visit Diagnoses    Dietary folate deficiency anemia       Relevant Orders   RBC Folate   Heartburn       Relevant Medications   omeprazole (PRILOSEC) 40 MG capsule   Other Relevant Orders   Comprehensive metabolic panel (Completed)      I have discontinued Joelene Millin A. Ahlgren "[name]"'s cyclobenzaprine. I have also changed her alprazolam and Liraglutide -Weight Management. Additionally, I am having her maintain her albuterol,  Azelastine-Fluticasone, SUMAtriptan, Multiple Vitamins-Minerals (ADULT GUMMY PO), montelukast, desloratadine, bimatoprost, celecoxib, Clindamycin-Benzoyl Per (Refr), ferrous sulfate, Vitamin D (Ergocalciferol), cyanocobalamin, SYRINGE 3CC/25GX1", metoprolol succinate, clonazePAM, traMADol, methocarbamol, tamsulosin, ondansetron, and omeprazole.  Meds ordered this encounter  Medications  . alprazolam (XANAX) 2 MG tablet    Sig: Take 1 tablet (2 mg total) by mouth daily as needed.    Dispense:  30 tablet    Refill:  5  . Liraglutide -Weight Management (SAXENDA) 18 MG/3ML SOPN    Sig: Inject 3 mg into the skin daily.    Dispense:  9 mL    Refill:  11    KEEP ON FILE FOR FUTURE REFILL S  . omeprazole (PRILOSEC) 40 MG capsule    Sig: Take 1 capsule (40 mg total) by mouth 2 (two) times daily before a meal.    Dispense:  180 capsule    Refill:  5    Medications Discontinued During This Encounter  Medication Reason  . cyclobenzaprine (FLEXERIL) 10 MG tablet Patient has not taken in last 30 days  . alprazolam (XANAX) 2 MG tablet Reorder  . Liraglutide -Weight Management (SAXENDA) 18 MG/3ML SOPN Reorder  . omeprazole (PRILOSEC) 40 MG capsule Reorder    Follow-up: Return in about 3 months (around 11/14/2017).   Crecencio Mc, MD

## 2017-08-14 NOTE — Patient Instructions (Signed)
If your iron stores are still low,  We will arrange iv iron through the Hematology office

## 2017-08-15 ENCOUNTER — Other Ambulatory Visit (INDEPENDENT_AMBULATORY_CARE_PROVIDER_SITE_OTHER): Payer: 59

## 2017-08-15 ENCOUNTER — Encounter: Payer: Self-pay | Admitting: *Deleted

## 2017-08-15 DIAGNOSIS — E559 Vitamin D deficiency, unspecified: Secondary | ICD-10-CM

## 2017-08-15 DIAGNOSIS — R12 Heartburn: Secondary | ICD-10-CM | POA: Diagnosis not present

## 2017-08-15 DIAGNOSIS — D520 Dietary folate deficiency anemia: Secondary | ICD-10-CM

## 2017-08-15 DIAGNOSIS — D509 Iron deficiency anemia, unspecified: Secondary | ICD-10-CM

## 2017-08-15 LAB — CBC WITH DIFFERENTIAL/PLATELET
Basophils Absolute: 0 10*3/uL (ref 0.0–0.1)
Basophils Relative: 0.4 % (ref 0.0–3.0)
Eosinophils Absolute: 0.1 10*3/uL (ref 0.0–0.7)
Eosinophils Relative: 0.8 % (ref 0.0–5.0)
HCT: 41.4 % (ref 36.0–46.0)
Hemoglobin: 13.4 g/dL (ref 12.0–15.0)
Lymphocytes Relative: 26.7 % (ref 12.0–46.0)
Lymphs Abs: 1.9 10*3/uL (ref 0.7–4.0)
MCHC: 32.3 g/dL (ref 30.0–36.0)
MCV: 76.8 fl — ABNORMAL LOW (ref 78.0–100.0)
Monocytes Absolute: 0.5 10*3/uL (ref 0.1–1.0)
Monocytes Relative: 7.4 % (ref 3.0–12.0)
Neutro Abs: 4.6 10*3/uL (ref 1.4–7.7)
Neutrophils Relative %: 64.7 % (ref 43.0–77.0)
Platelets: 346 10*3/uL (ref 150.0–400.0)
RBC: 5.39 Mil/uL — ABNORMAL HIGH (ref 3.87–5.11)
RDW: 23.3 % — ABNORMAL HIGH (ref 11.5–15.5)
WBC: 7.1 10*3/uL (ref 4.0–10.5)

## 2017-08-15 LAB — COMPREHENSIVE METABOLIC PANEL
ALT: 28 U/L (ref 0–35)
AST: 25 U/L (ref 0–37)
Albumin: 4.3 g/dL (ref 3.5–5.2)
Alkaline Phosphatase: 83 U/L (ref 39–117)
BUN: 8 mg/dL (ref 6–23)
CO2: 26 mEq/L (ref 19–32)
Calcium: 9.4 mg/dL (ref 8.4–10.5)
Chloride: 103 mEq/L (ref 96–112)
Creatinine, Ser: 0.76 mg/dL (ref 0.40–1.20)
GFR: 90.27 mL/min (ref >60.00)
Glucose, Bld: 103 mg/dL — ABNORMAL HIGH (ref 70–99)
Potassium: 3.3 mEq/L — ABNORMAL LOW (ref 3.5–5.1)
Sodium: 141 mEq/L (ref 135–145)
Total Bilirubin: 0.4 mg/dL (ref 0.2–1.2)
Total Protein: 7.5 g/dL (ref 6.0–8.3)

## 2017-08-15 LAB — VITAMIN D 25 HYDROXY (VIT D DEFICIENCY, FRACTURES): VITD: 44.12 ng/mL (ref 30.00–100.00)

## 2017-08-15 MED ORDER — POTASSIUM CHLORIDE CRYS ER 20 MEQ PO TBCR: 20.0000 meq | EXTENDED_RELEASE_TABLET | Freq: Every day | ORAL | 0 refills | Status: DC

## 2017-08-15 NOTE — Assessment & Plan Note (Signed)
She has lost 30 lgs since starting Saxenda. .continue medication

## 2017-08-15 NOTE — Assessment & Plan Note (Addendum)
With history of menorrhagia,.now resolved.   Lab Results  Component Value Date   WBC 7.1 08/15/2017   HGB 13.4 08/15/2017   HCT 41.4 08/15/2017   MCV 76.8 (L) 08/15/2017   PLT 346.0 08/15/2017    . Lab Results  Component Value Date   IRON 34 (L) 08/15/2017   TIBC 381 08/15/2017   FERRITIN 11 08/15/2017

## 2017-08-15 NOTE — Assessment & Plan Note (Signed)
With improved symptoms since resuming temazepam.  Trial of valium.. Not receptive to SSRI trial due to past lack of success

## 2017-08-16 LAB — IRON,TIBC AND FERRITIN PANEL
%SAT: 9 % (calc) — ABNORMAL LOW (ref 11–50)
Ferritin: 11 ng/mL (ref 10–154)
Iron: 34 ug/dL — ABNORMAL LOW (ref 40–190)
TIBC: 381 mcg/dL (calc) (ref 250–450)

## 2017-08-16 LAB — FOLATE RBC: RBC Folate: 833 ng/mL RBC (ref >280)

## 2017-08-30 ENCOUNTER — Encounter: Payer: Self-pay | Admitting: Urology

## 2017-09-03 ENCOUNTER — Ambulatory Visit: Payer: 59 | Admitting: Gastroenterology

## 2017-09-17 ENCOUNTER — Other Ambulatory Visit: Payer: Self-pay | Admitting: Internal Medicine

## 2017-09-25 ENCOUNTER — Other Ambulatory Visit: Payer: Self-pay | Admitting: Internal Medicine

## 2017-10-18 ENCOUNTER — Encounter: Payer: Self-pay | Admitting: Internal Medicine

## 2017-10-21 ENCOUNTER — Encounter: Payer: Self-pay | Admitting: Internal Medicine

## 2017-10-21 DIAGNOSIS — M5431 Sciatica, right side: Secondary | ICD-10-CM

## 2017-10-21 DIAGNOSIS — M5412 Radiculopathy, cervical region: Secondary | ICD-10-CM

## 2017-10-24 NOTE — Telephone Encounter (Signed)
LMTCB. Please transfer pt to our office.  

## 2017-10-26 ENCOUNTER — Ambulatory Visit: Payer: 59 | Admitting: Internal Medicine

## 2017-10-26 ENCOUNTER — Ambulatory Visit (INDEPENDENT_AMBULATORY_CARE_PROVIDER_SITE_OTHER): Payer: 59

## 2017-10-26 ENCOUNTER — Encounter: Payer: Self-pay | Admitting: Internal Medicine

## 2017-10-26 DIAGNOSIS — M5412 Radiculopathy, cervical region: Secondary | ICD-10-CM

## 2017-10-26 DIAGNOSIS — M5431 Sciatica, right side: Secondary | ICD-10-CM | POA: Diagnosis not present

## 2017-10-26 DIAGNOSIS — M545 Low back pain, unspecified: Secondary | ICD-10-CM

## 2017-10-26 DIAGNOSIS — Z6841 Body Mass Index (BMI) 40.0 and over, adult: Secondary | ICD-10-CM

## 2017-10-26 DIAGNOSIS — M4802 Spinal stenosis, cervical region: Secondary | ICD-10-CM | POA: Diagnosis not present

## 2017-10-26 DIAGNOSIS — M4722 Other spondylosis with radiculopathy, cervical region: Secondary | ICD-10-CM

## 2017-10-26 NOTE — Progress Notes (Signed)
Subjective:  Patient ID: Tammy Christian, female    DOB: June 27, 1980  Age: 37 y.o. MRN: [id number]  CC: Diagnoses of Cervical radiculopathy due to degenerative joint disease of spine, Low back pain potentially associated with radiculopathy, and Class 3 severe obesity due to excess calories without serious comorbidity with body mass index (BMI) of 40.0 to 44.9 in adult [facility]) were pertinent to this visit.  HPI Tammy Christian presents for  Subacute onset radicular pain in  right arm that started about 4 weeks ago.  The pain is described as severe and initially localized to the deltoid region but has radiated to the hand. For the past week she has experiencing similar quality pain in the  left arm .  She has not had severe pain in her neck ,  But  Notes no recent inujry to either arm.  Her second issue s recurrence of lumbar pain  Radiating to right buttock accompanied by recurrent "cracking" of lower spine.  She underwent lumbar fusion one year ago, and has lost 55 lbs  Outpatient Medications Prior to Visit  Medication Sig Dispense Refill  . albuterol (PROVENTIL HFA;VENTOLIN HFA) 108 (90 BASE) MCG/ACT inhaler Inhale 2 puffs into the lungs every 4 (four) hours as needed. (Patient taking differently: Inhale 2 puffs into the lungs every 4 (four) hours as needed for wheezing or shortness of breath. ) 3 Inhaler 1  . alprazolam (XANAX) 2 MG tablet Take 1 tablet (2 mg total) by mouth daily as needed. 30 tablet 5  . Azelastine-Fluticasone (DYMISTA) 137-50 MCG/ACT SUSP PLACE 1 SPRAY INTO THE NOSE 2 TIMES DAILY AS NEEDED. (Patient taking differently: Place 1 spray into the nose daily as needed. ) 69 g 11  . bimatoprost (LATISSE) 0.03 % ophthalmic solution   6  . celecoxib (CELEBREX) 200 MG capsule Take 1 capsule (200 mg total) by mouth 2 (two) times daily. As needed for pain 60 capsule 2  . Clindamycin-Benzoyl Per, Refr, gel APPLY TO THE AFFECTED AREA(S) DAILY 45 g 1  . Clindamycin-Benzoyl Per,  Refr, gel APPLY TO THE AFFECTED AREA(S) DAILY 45 g 1  . clonazePAM (KLONOPIN) 1 MG tablet Take 1 tablet (1 mg total) by mouth 2 (two) times daily. 60 tablet 5  . cyanocobalamin (,VITAMIN B-12,) 1000 MCG/ML injection INJECT 1ML DAILY FOR 3 DAYS, THEN WEEKLY X 4 WEEKS THEN MONTHLY THEREAFTER 10 mL 0  . desloratadine (CLARINEX) 5 MG tablet TAKE 1 TABLET (5 MG TOTAL) BY MOUTH DAILY. 90 tablet 3  . ferrous sulfate 325 (65 FE) MG tablet One tablet daily with orange juice 30 tablet 5  . Liraglutide -Weight Management (SAXENDA) 18 MG/3ML SOPN Inject 3 mg into the skin daily. 9 mL 11  . methocarbamol (ROBAXIN) 500 MG tablet   1  . metoprolol succinate (TOPROL-XL) 100 MG 24 hr tablet TAKE 1 TABLET BY MOUTH DAILY. TAKE WITH OR IMMEDIATELY FOLLOWING A MEAL. 90 tablet 3  . montelukast (SINGULAIR) 10 MG tablet TAKE 1 TABLET (10 MG TOTAL) BY MOUTH AT BEDTIME. 90 tablet 3  . Multiple Vitamins-Minerals (ADULT GUMMY PO) Take 2 tablets by mouth daily.    Marland Kitchen omeprazole (PRILOSEC) 40 MG capsule Take 1 capsule (40 mg total) by mouth 2 (two) times daily before a meal. 180 capsule 5  . ondansetron (ZOFRAN) 4 MG tablet Take 1 tablet (4 mg total) by mouth every 8 (eight) hours as needed for nausea or vomiting. 10 tablet 0  . potassium chloride SA (K-DUR,KLOR-CON) 20 MEQ tablet  Take 1 tablet (20 mEq total) by mouth daily. For 5 days, then as needed for low potassium 30 tablet 0  . SUMAtriptan (IMITREX) 100 MG tablet TAKE 1 TABLET BY MOUTH DAILY AS NEEDED. 10 tablet 5  . Syringe/Needle, Disp, (SYRINGE 3CC/25GX1") 25G X 1" 3 ML MISC Use for b12 injections 50 each 0  . tamsulosin (FLOMAX) 0.4 MG CAPS capsule Take 1 capsule (0.4 mg total) by mouth daily. (Patient taking differently: Take 0.4 mg by mouth daily. ) 30 capsule 0  . traMADol (ULTRAM) 50 MG tablet Take 1 tablet (50 mg total) by mouth every 6 (six) hours as needed. 120 tablet 5  . Vitamin D, Ergocalciferol, (DRISDOL) 50000 units CAPS capsule TAKE 1 CAPSULE BY MOUTH EVERY  7 DAYS ON SUNDAYS 12 capsule 3   No facility-administered medications prior to visit.     Review of Systems;  Patient denies headache, fevers, malaise, unintentional weight loss, skin rash, eye pain, sinus congestion and sinus pain, sore throat, dysphagia,  hemoptysis , cough, dyspnea, wheezing, chest pain, palpitations, orthopnea, edema, abdominal pain, nausea, melena, diarrhea, constipation, flank pain, dysuria, hematuria, urinary  Frequency, nocturia, numbness, tingling, seizures,  Focal weakness, Loss of consciousness,  Tremor, insomnia, depression, anxiety, and suicidal ideation.      Objective:  BP 120/80 (BP Location: Left Arm, Patient Position: Sitting, Cuff Size: Large)   Pulse 100   Temp 97.7 F (36.5 C) (Oral)   Resp 16   Ht 5\' 7"  (1.702 m)   Wt 212 lb (96.2 kg)   SpO2 98%   BMI 33.20 kg/m   BP Readings from Last 3 Encounters:  10/26/17 120/80  08/14/17 114/76  07/12/17 (!) 116/53    Wt Readings from Last 3 Encounters:  10/26/17 212 lb (96.2 kg)  08/14/17 229 lb 3.2 oz (104 kg)  07/12/17 240 lb (108.9 kg)    General appearance: alert, cooperative and appears stated age Ears: normal TM's and external ear canals both ears Throat: lips, mucosa, and tongue normal; teeth and gums normal Neck: no adenopathy, no carotid bruit, supple, symmetrical, trachea midline and thyroid not enlarged, symmetric, no tenderness/mass/nodules Back: symmetric, no curvature. ROM normal. No CVA tenderness. Lungs: clear to auscultation bilaterally Heart: regular rate and rhythm, S1, S2 normal, no murmur, click, rub or gallop Abdomen: soft, non-tender; bowel sounds normal; no masses,  no organomegaly Pulses: 2+ and symmetric Skin: Skin color, texture, turgor normal. No rashes or lesions Lymph nodes: Cervical, supraclavicular, and axillary nodes normal. Neuro: CNs 2-12 intact. DTRs 2+/4 in biceps, brachioradialis, patellars and achilles. Muscle strength 5/5 in upper and lower exremities.  Fine resting tremor bilaterally both hands cerebellar function normal. Romberg negative.  No pronator drift.   Gait normal.   Lab Results  Component Value Date   HGBA1C 5.8 05/15/2017   HGBA1C 5.4 04/29/2014    Lab Results  Component Value Date   CREATININE 0.76 08/15/2017   CREATININE 0.60 05/15/2017   CREATININE 0.76 04/28/2017    Lab Results  Component Value Date   WBC 7.1 08/15/2017   HGB 13.4 08/15/2017   HCT 41.4 08/15/2017   PLT 346.0 08/15/2017   GLUCOSE 103 (H) 08/15/2017   CHOL 214 (H) 05/15/2017   TRIG 161.0 (H) 05/15/2017   HDL 52.50 05/15/2017   LDLCALC 129 (H) 05/15/2017   ALT 28 08/15/2017   AST 25 08/15/2017   NA 141 08/15/2017   K 3.3 (L) 08/15/2017   CL 103 08/15/2017   CREATININE 0.76 08/15/2017  BUN 8 08/15/2017   CO2 26 08/15/2017   TSH 1.36 05/15/2017   HGBA1C 5.8 05/15/2017    No results found.  Assessment & Plan:   Problem List Items Addressed This Visit    Obesity    I have congratulated her in reduction of   BMI and encouraged  Continued weight loss with goal of 10% of body weigh over the next 6 months using a low glycemic index diet and regular exercise a minimum of 5 days per week.        Cervical radiculopathy due to degenerative joint disease of spine    Plain films done today note spurring and disk space narrowing at the lower cervical levels. Radiology report Mild degenerative changes. Minimal retrolisthesis of C5 versus C6, approximately 2 mm. Mild narrowing of the right C5-6 neural foramen due to small posterior osteophytes.notes .  Pain management regimen discussed; she wants to avid narcotics .  Continue celebrex and gabapentin       Low back pain potentially associated with radiculopathy    Plain films of lumbar spine note  Superior displacement of the  surgical rod on the left side at L5.   Her pain is on the right , however. Will treat with antiinflammatory and gabepentin.  Follow up with neurosurgery          I am  having Tammy Millin A. Kruschke "[name]" maintain her albuterol, Azelastine-Fluticasone, Multiple Vitamins-Minerals (ADULT GUMMY PO), montelukast, desloratadine, bimatoprost, celecoxib, Clindamycin-Benzoyl Per (Refr), ferrous sulfate, Vitamin D (Ergocalciferol), SYRINGE 3CC/25GX1", metoprolol succinate, clonazePAM, traMADol, methocarbamol, tamsulosin, ondansetron, alprazolam, Liraglutide -Weight Management, omeprazole, potassium chloride SA, Clindamycin-Benzoyl Per (Refr), cyanocobalamin, and SUMAtriptan.  No orders of the defined types were placed in this encounter.   There are no discontinued medications.  Follow-up: No follow-ups on file.   Crecencio Mc, MD

## 2017-10-28 DIAGNOSIS — M545 Low back pain, unspecified: Secondary | ICD-10-CM | POA: Insufficient documentation

## 2017-10-28 DIAGNOSIS — M4722 Other spondylosis with radiculopathy, cervical region: Secondary | ICD-10-CM | POA: Insufficient documentation

## 2017-10-28 NOTE — Assessment & Plan Note (Addendum)
Plain films of lumbar spine note  Superior displacement of the  surgical rod on the left side at L5.   Her pain is on the right , however. Will treat with antiinflammatory and gabepentin.  Follow up with neurosurgery

## 2017-10-28 NOTE — Assessment & Plan Note (Signed)
I have congratulated her in reduction of   BMI and encouraged  Continued weight loss with goal of 10% of body weigh over the next 6 months using a low glycemic index diet and regular exercise a minimum of 5 days per week.    

## 2017-10-28 NOTE — Assessment & Plan Note (Addendum)
Plain films done today note spurring and disk space narrowing at the lower cervical levels. Radiology report Mild degenerative changes. Minimal retrolisthesis of C5 versus C6, approximately 2 mm. Mild narrowing of the right C5-6 neural foramen due to small posterior osteophytes.notes .  Pain management regimen discussed; she wants to avid narcotics .  Continue celebrex and gabapentin

## 2017-11-01 ENCOUNTER — Other Ambulatory Visit: Payer: Self-pay | Admitting: Internal Medicine

## 2017-11-12 ENCOUNTER — Other Ambulatory Visit: Payer: Self-pay | Admitting: Internal Medicine

## 2017-11-12 MED ORDER — OXYCODONE HCL 5 MG PO TABS: 5.0000 mg | ORAL_TABLET | ORAL | 0 refills | Status: DC | PRN

## 2017-11-13 ENCOUNTER — Ambulatory Visit: Payer: 59 | Admitting: Urology

## 2017-11-13 ENCOUNTER — Encounter: Payer: Self-pay | Admitting: Urology

## 2017-11-13 VITALS — BP 119/82 | HR 109 | Ht 67.0 in | Wt 210.0 lb

## 2017-11-13 DIAGNOSIS — N200 Calculus of kidney: Secondary | ICD-10-CM

## 2017-11-13 DIAGNOSIS — R82991 Hypocitraturia: Secondary | ICD-10-CM

## 2017-11-13 NOTE — Progress Notes (Signed)
11/13/2017 3:46 PM   Tammy Christian Jun 26, 1980 [id number]  Referring provider: Crecencio Mc, MD [place], [place] [address]  Chief Complaint  Patient presents with  . Results    24 hour urine    HPI: 37 year old female with history of recurrent nephrolithiasis returns today to discuss her 24-hour urine results.  We tried on several occasions to review this over the phone but played phone tag.  Overall, her urinary volume is very low.  She only produced 1.17 L of urine over 24..  She also had a low urinary citrate level of 169 mg/day.  Her urinary calcium was low, 20 mg/day, low urinary oxalate at 20 mg/day.  She is also noted to have hypomagnesia urea.  In terms of labs, her creatinine was 0.7, calcium 9.3, magnesium 1.8, potassium 4.4, CO2 25.  She has an unknown stone composition.  She does have a personal family history of stones.  Notably, her stone are quite small, ranging in 1-2 mm.  She is never required surgical intervention.  She continues to have issues drinking lots of water.  She drinks 2 cups of coffee in the morning and has difficulty drinking water throughout the day.  She is made a concerted effort to drink more water when she gets home.  She is also been actively working to lose weight.  She is been taking whey protein and lost total of 8 5 pounds over the past year.  She does have a personal history of morbid obesity status post gastric sleeve.   PMH: Past Medical History:  Diagnosis Date  . Abnormal Pap smear of cervix 2012  . Abortion 2001  . Allergy   . Anxiety    panic disorder  . Arthritis   . Asthma   . Gastritis 2005  . Hyperlipidemia   . Kidney stone    multiple  . Migraines   . Obesity   . Panic disorder   . Vitamin D deficiency disease     Surgical History: Past Surgical History:  Procedure Laterality Date  . BREAST BIOPSY Right 02/04/2001   Negative c Clip  . CHOLECYSTECTOMY  04/08/2012  .  COLONOSCOPY WITH PROPOFOL N/A 07/12/2017   Procedure: COLONOSCOPY WITH PROPOFOL;  Surgeon: Lin Landsman, MD;  Location: [facility];  Service: Gastroenterology;  Laterality: N/A;  . COLPOSCOPY  2012  . ESOPHAGOGASTRODUODENOSCOPY (EGD) WITH PROPOFOL N/A 07/12/2017   Procedure: ESOPHAGOGASTRODUODENOSCOPY (EGD) WITH PROPOFOL;  Surgeon: Lin Landsman, MD;  Location: [place];  Service: Gastroenterology;  Laterality: N/A;  . KNEE ARTHROSCOPY Left 05/01/2016   Procedure: ARTHROSCOPY KNEE LATERAL RELEASE;  Surgeon: Hessie Knows, MD;  Location: [facility]S;  Service: Orthopedics;  Laterality: Left;  . laprascopic sleeve  04/08/2012   GASTRIC  . SPINAL FUSION  09/25/2016    Home Medications:  Allergies as of 11/13/2017      Reactions   Other Hives, Rash   Dermabond. CATS, SEASONAL ALLERGIES      Medication List        Accurate as of 11/13/17  3:46 PM. Always use your most recent med list.          ADULT GUMMY PO Take 2 tablets by mouth daily.   albuterol 108 (90 Base) MCG/ACT inhaler Commonly known as:  PROVENTIL HFA;VENTOLIN HFA Inhale 2 puffs into the lungs every 4 (four) hours as needed.   alprazolam 2 MG tablet Commonly known as:  XANAX Take 1 tablet (2 mg total) by mouth  daily as needed.   Azelastine-Fluticasone 137-50 MCG/ACT Susp PLACE 1 SPRAY INTO THE NOSE 2 TIMES DAILY AS NEEDED.   bimatoprost 0.03 % ophthalmic solution Commonly known as:  LATISSE   celecoxib 200 MG capsule Commonly known as:  CELEBREX TAKE 1 CAPSULE BY MOUTH 2 TIMES DAILY AS NEEDED FOR PAIN   Clindamycin-Benzoyl Per (Refr) gel APPLY TO THE AFFECTED AREA(S) DAILY   Clindamycin-Benzoyl Per (Refr) gel APPLY TO THE AFFECTED AREA(S) DAILY   clonazePAM 1 MG tablet Commonly known as:  KLONOPIN Take 1 tablet (1 mg total) by mouth 2 (two) times daily.   cyanocobalamin 1000 MCG/ML injection Commonly known as:  (VITAMIN B-12) INJECT 1ML DAILY FOR 3 DAYS, THEN WEEKLY X 4 WEEKS THEN  MONTHLY THEREAFTER   desloratadine 5 MG tablet Commonly known as:  CLARINEX TAKE 1 TABLET (5 MG TOTAL) BY MOUTH DAILY.   ferrous sulfate 325 (65 FE) MG tablet One tablet daily with orange juice   Liraglutide -Weight Management 18 MG/3ML Sopn Inject 3 mg into the skin daily.   methocarbamol 500 MG tablet Commonly known as:  ROBAXIN   metoprolol succinate 100 MG 24 hr tablet Commonly known as:  TOPROL-XL TAKE 1 TABLET BY MOUTH DAILY. TAKE WITH OR IMMEDIATELY FOLLOWING A MEAL.   montelukast 10 MG tablet Commonly known as:  SINGULAIR TAKE 1 TABLET (10 MG TOTAL) BY MOUTH AT BEDTIME.   omeprazole 40 MG capsule Commonly known as:  PRILOSEC Take 1 capsule (40 mg total) by mouth 2 (two) times daily before a meal.   oxyCODONE 5 MG immediate release tablet Commonly known as:  Oxy IR/ROXICODONE Take 1 tablet (5 mg total) by mouth every 4 (four) hours as needed for severe pain.   potassium chloride SA 20 MEQ tablet Commonly known as:  K-DUR,KLOR-CON Take 1 tablet (20 mEq total) by mouth daily. For 5 days, then as needed for low potassium   SUMAtriptan 100 MG tablet Commonly known as:  IMITREX TAKE 1 TABLET BY MOUTH DAILY AS NEEDED.   SYRINGE 3CC/25GX1" 25G X 1" 3 ML Misc Use for b12 injections   traMADol 50 MG tablet Commonly known as:  ULTRAM Take 1 tablet (50 mg total) by mouth every 6 (six) hours as needed.   Vitamin D (Ergocalciferol) 50000 units Caps capsule Commonly known as:  DRISDOL TAKE 1 CAPSULE BY MOUTH EVERY 7 DAYS ON SUNDAYS       Allergies:  Allergies  Allergen Reactions  . Other Hives and Rash    Dermabond. CATS, SEASONAL ALLERGIES    Family History: Family History  Problem Relation Age of Onset  . Cancer Maternal Grandmother        LUNG, THROAT  . Macular degeneration Maternal Grandmother   . Heart disease Paternal Grandmother   . Alzheimer's disease Paternal Grandmother   . Stroke Paternal Grandmother   . Cancer Paternal Grandfather         COLON  . Diabetes Father   . Heart disease Father        atrial fib  . Hyperlipidemia Father   . Hypertension Father   . Cancer Father 27       colon polyps  . Colon polyps Father     Social History:  reports that she has never smoked. She has never used smokeless tobacco. She reports that she drinks about 2.0 standard drinks of alcohol per week. She reports that she does not use drugs.  ROS: UROLOGY Frequent Urination?: No Hard to postpone urination?: No Burning/pain  with urination?: No Get up at night to urinate?: No Leakage of urine?: No Urine stream starts and stops?: No Trouble starting stream?: No Do you have to strain to urinate?: No Blood in urine?: No Urinary tract infection?: No Sexually transmitted disease?: No Injury to kidneys or bladder?: No Painful intercourse?: No Weak stream?: No Currently pregnant?: No Vaginal bleeding?: No Last menstrual period?: n  Gastrointestinal Nausea?: No Vomiting?: No Indigestion/heartburn?: No Diarrhea?: No Constipation?: No  Constitutional Fever: No Night sweats?: No Weight loss?: No Fatigue?: No  Skin Skin rash/lesions?: No Itching?: No  Eyes Blurred vision?: No Double vision?: No  Ears/Nose/Throat Sore throat?: No Sinus problems?: No  Hematologic/Lymphatic Swollen glands?: No Easy bruising?: No  Cardiovascular Leg swelling?: No Chest pain?: No  Respiratory Cough?: No Shortness of breath?: No  Endocrine Excessive thirst?: No  Musculoskeletal Back pain?: No Joint pain?: No  Neurological Headaches?: No Dizziness?: No  Psychologic Depression?: No Anxiety?: No  Physical Exam: BP 119/82   Pulse (!) 109   Ht 5\' 7"  (1.702 m)   Wt 210 lb (95.3 kg)   BMI 32.89 kg/m   Constitutional:  Alert and oriented, No acute distress. HEENT: [place] AT, moist mucus membranes.  Trachea midline, no masses. Cardiovascular: No clubbing, cyanosis, or edema. Respiratory: Normal respiratory effort, no increased  work of breathing. Skin: No rashes, bruises or suspicious lesions. Neurologic: Grossly intact, no focal deficits, moving all 4 extremities. Psychiatric: Normal mood and affect.  Laboratory Data: Lab Results  Component Value Date   WBC 7.1 08/15/2017   HGB 13.4 08/15/2017   HCT 41.4 08/15/2017   MCV 76.8 (L) 08/15/2017   PLT 346.0 08/15/2017    Lab Results  Component Value Date   CREATININE 0.76 08/15/2017    Lab Results  Component Value Date   HGBA1C 5.8 05/15/2017    Urinalysis n/a  Pertinent Imaging: N/a  Assessment & Plan:    1. Recurrent nephrolithiasis Recurrent nephrolithiasis with multiple risk factors including history of gastric sleeve 24 urine shows a low urinary volume which is her primary metabolic deficit Strongly encouraged to increase fluids to create 2.5 L of urine Additional stone recommendations were also reviewed and she was given information packet  2. Hypocitraturia Profoundly low urinary citrate levels Encouraged increased PO citrate levels-high citrate containing foods and beverages We also discussed trial of potassium citrate 15 mEq twice daily, she is concerned about the size of the tablet She is given 2 weeks of samples today and will have a potassium drawn in 2 weeks if she thinks that she can tolerate this medication   Hollice Espy, MD  [facility] [address] [place], [place] [address] [phone]

## 2017-11-14 NOTE — Telephone Encounter (Signed)
Not in current medication list.  Refilled: 03/02/2016 Last OV: 10/26/2017 Next OV: not scheduled

## 2017-11-20 DIAGNOSIS — M47812 Spondylosis without myelopathy or radiculopathy, cervical region: Secondary | ICD-10-CM | POA: Diagnosis not present

## 2017-11-20 DIAGNOSIS — M4317 Spondylolisthesis, lumbosacral region: Secondary | ICD-10-CM | POA: Diagnosis not present

## 2017-11-23 ENCOUNTER — Telehealth: Payer: Self-pay | Admitting: Internal Medicine

## 2017-11-23 NOTE — Telephone Encounter (Signed)
Copied from [place] [id number]. Topic: General - Other >> Nov 23, 2017  4:28 PM Keene Breath wrote: Reason for CRM: Antoinette with Cover My Med[employer] call to verify PA was received for patient's medications.  Key ID# [id number].  Please advise.  CB# [phone].

## 2017-11-23 NOTE — Telephone Encounter (Signed)
Prior auth question

## 2017-11-28 NOTE — Telephone Encounter (Signed)
Prior authorization has been received and submitted on covermymeds.

## 2017-11-29 ENCOUNTER — Other Ambulatory Visit: Payer: Self-pay | Admitting: Internal Medicine

## 2017-11-29 NOTE — Telephone Encounter (Signed)
Refilled: 05/30/2017 Last OV: 10/26/2017 Next OV: not scheduled

## 2017-12-03 ENCOUNTER — Ambulatory Visit: Payer: 59 | Admitting: Internal Medicine

## 2017-12-04 NOTE — Telephone Encounter (Signed)
Please let paitent know

## 2017-12-04 NOTE — Telephone Encounter (Signed)
PA for Tammy Christian was denied by insurance. Fax received states that pt must have a previous trail of Contrave unless there is some contraindication.

## 2017-12-05 ENCOUNTER — Encounter: Payer: Self-pay | Admitting: Urology

## 2017-12-06 NOTE — Telephone Encounter (Signed)
Sent pt a mychart message letting her know.  

## 2017-12-14 ENCOUNTER — Other Ambulatory Visit: Payer: Self-pay | Admitting: Internal Medicine

## 2017-12-14 NOTE — Telephone Encounter (Signed)
Tramadol     Refilled: 05/30/2017 Clindamycin     Refilled: 09/17/2017 Last OV: 10/26/2017 Next OV: not scheduled

## 2017-12-21 NOTE — Telephone Encounter (Signed)
Tammy Christian covermymeds is calling  to see if doctor would like to provider additional info for the saxenda to see if med could be approve go to [email or web address] and type in 8 digit key [id number]  . Tammy Christian can be reach at [phone]

## 2017-12-21 NOTE — Telephone Encounter (Signed)
Please advise 

## 2017-12-28 ENCOUNTER — Other Ambulatory Visit: Payer: Self-pay | Admitting: Internal Medicine

## 2017-12-31 DIAGNOSIS — Z3009 Encounter for other general counseling and advice on contraception: Secondary | ICD-10-CM | POA: Diagnosis not present

## 2017-12-31 DIAGNOSIS — Z30432 Encounter for removal of intrauterine contraceptive device: Secondary | ICD-10-CM | POA: Diagnosis not present

## 2017-12-31 DIAGNOSIS — N921 Excessive and frequent menstruation with irregular cycle: Secondary | ICD-10-CM | POA: Diagnosis not present

## 2017-12-31 DIAGNOSIS — Z30017 Encounter for initial prescription of implantable subdermal contraceptive: Secondary | ICD-10-CM | POA: Diagnosis not present

## 2017-12-31 NOTE — Telephone Encounter (Signed)
PA has been resubmitted

## 2018-01-03 DIAGNOSIS — E669 Obesity, unspecified: Secondary | ICD-10-CM

## 2018-01-03 NOTE — Telephone Encounter (Signed)
PA for Tammy Christian has been approved from 01/01/2018 through 01/01/2019. Sent pt a mychart message to let her know medication has been approved.

## 2018-02-27 ENCOUNTER — Other Ambulatory Visit: Payer: Self-pay | Admitting: Internal Medicine

## 2018-02-27 NOTE — Telephone Encounter (Signed)
Refilled: 08/14/2017 Last OV: 10/26/2017 Next OV: 05/20/2018

## 2018-04-15 ENCOUNTER — Other Ambulatory Visit: Payer: Self-pay | Admitting: Internal Medicine

## 2018-05-08 ENCOUNTER — Other Ambulatory Visit (INDEPENDENT_AMBULATORY_CARE_PROVIDER_SITE_OTHER): Payer: Self-pay | Admitting: Vascular Surgery

## 2018-05-08 ENCOUNTER — Other Ambulatory Visit: Payer: Self-pay | Admitting: Internal Medicine

## 2018-05-20 ENCOUNTER — Encounter: Payer: Self-pay | Admitting: Internal Medicine

## 2018-05-20 ENCOUNTER — Ambulatory Visit: Payer: 59 | Admitting: Internal Medicine

## 2018-05-20 VITALS — BP 102/64 | HR 80 | Temp 97.8°F | Resp 14 | Ht 67.0 in | Wt 206.0 lb

## 2018-05-20 DIAGNOSIS — E559 Vitamin D deficiency, unspecified: Secondary | ICD-10-CM | POA: Diagnosis not present

## 2018-05-20 DIAGNOSIS — M5387 Other specified dorsopathies, lumbosacral region: Secondary | ICD-10-CM | POA: Diagnosis not present

## 2018-05-20 DIAGNOSIS — D500 Iron deficiency anemia secondary to blood loss (chronic): Secondary | ICD-10-CM | POA: Diagnosis not present

## 2018-05-20 DIAGNOSIS — Z87898 Personal history of other specified conditions: Secondary | ICD-10-CM | POA: Diagnosis not present

## 2018-05-20 DIAGNOSIS — Z13220 Encounter for screening for lipoid disorders: Secondary | ICD-10-CM

## 2018-05-20 DIAGNOSIS — R5383 Other fatigue: Secondary | ICD-10-CM

## 2018-05-20 DIAGNOSIS — M5441 Lumbago with sciatica, right side: Secondary | ICD-10-CM

## 2018-05-20 DIAGNOSIS — E538 Deficiency of other specified B group vitamins: Secondary | ICD-10-CM

## 2018-05-20 DIAGNOSIS — Z0000 Encounter for general adult medical examination without abnormal findings: Secondary | ICD-10-CM | POA: Diagnosis not present

## 2018-05-20 MED ORDER — CLONAZEPAM 1 MG PO TABS: 1.0000 mg | ORAL_TABLET | Freq: Two times a day (BID) | ORAL | 5 refills | Status: DC

## 2018-05-20 MED ORDER — MOMETASONE FUROATE 50 MCG/ACT NA SUSP: 2.0000 | Freq: Every day | NASAL | 12 refills | Status: DC

## 2018-05-20 MED ORDER — AZELASTINE HCL 0.15 % NA SOLN: 1.0000 | Freq: Two times a day (BID) | NASAL | 11 refills | Status: DC

## 2018-05-20 NOTE — Progress Notes (Signed)
Patient ID: Tammy Christian, female    DOB: March 18, 1981  Age: 38 y.o. MRN: [id number]  The patient is here for her annual preventive examination  [facility] for gyn exams and breast exams.   The risk factors are reflected in the social history.  The roster of all physicians providing medical care to patient - is listed in the Snapshot section of the chart.  Activities of daily living:  The patient is 100% independent in all ADLs: dressing, toileting, feeding as well as independent mobility  Home safety : The patient has smoke detectors in the home. They wear seatbelts.  There are no firearms at home. There is no violence in the home.   There is no risks for hepatitis, STDs or HIV. There is no   history of blood transfusion. They have no travel history to infectious disease endemic areas of the world.  The patient has seen their dentist in the last six month. They have seen their eye doctor in the last year.    They do not  have excessive sun exposure. Discussed the need for sun protection: hats, long sleeves and use of sunscreen if there is significant sun exposure.   Diet: the importance of a healthy diet is discussed. They do have a healthy diet.  The benefits of regular aerobic exercise were discussed. She is not exercising .   Depression screen: there are no signs or vegative symptoms of depression- irritability, change in appetite, anhedonia, sadness/tearfullness.  The following portions of the patient's history were reviewed and updated as appropriate: allergies, current medications, past family history, past medical history,  past surgical history, past social history  and problem list.  Visual acuity was not assessed per patient preference since she has regular follow up with her ophthalmologist. Hearing and body mass index were assessed and reviewed.   During the course of the visit the patient was educated and counseled about appropriate screening and preventive  services including : fall prevention , diabetes screening, nutrition counseling, colorectal cancer screening, and recommended immunizations.    CC: The primary encounter diagnosis was Vitamin D deficiency. Diagnoses of Encounter for preventive health examination, Iron deficiency anemia due to chronic blood loss, B12 deficiency, Screening for hyperlipidemia, Other fatigue, Acute midline low back pain with right-sided sciatica, History of atypical nevus, and Sciatica of left side associated with disorder of lumbosacral spine were also pertinent to this visit.  History Joelene Millin has a past medical history of Abnormal Pap smear of cervix (2012), Abortion (2001), Allergy, Anxiety, Arthritis, Asthma, Gastritis (2005), Hyperlipidemia, Kidney stone, Migraines, Obesity, Panic disorder, and Vitamin D deficiency disease.   She has a past surgical history that includes Cholecystectomy (04/08/2012); laprascopic sleeve (04/08/2012); Knee arthroscopy (Left, 05/01/2016); Breast biopsy (Right, 02/04/2001); Colposcopy (2012); Spinal fusion (09/25/2016); Esophagogastroduodenoscopy (egd) with propofol (N/A, 07/12/2017); and Colonoscopy with propofol (N/A, 07/12/2017).   Her family history includes Alzheimer's disease in her paternal grandmother; Cancer in her maternal grandmother and paternal grandfather; Cancer (age of onset: 69) in her father; Colon polyps in her father; Diabetes in her father; Heart disease in her father and paternal grandmother; Hyperlipidemia in her father; Hypertension in her father; Macular degeneration in her maternal grandmother; Stroke in her paternal grandmother.She reports that she has never smoked. She has never used smokeless tobacco. She reports current alcohol use of about 2.0 standard drinks of alcohol per week. She reports that she does not use drugs.  Outpatient Medications Prior to Visit  Medication Sig Dispense Refill  .  albuterol (PROVENTIL HFA;VENTOLIN HFA) 108 (90 BASE) MCG/ACT inhaler  Inhale 2 puffs into the lungs every 4 (four) hours as needed. (Patient taking differently: Inhale 2 puffs into the lungs every 4 (four) hours as needed for wheezing or shortness of breath. ) 3 Inhaler 1  . alprazolam (XANAX) 2 MG tablet TAKE 1 TABLET BY MOUTH ONCE DAILY AS NEEDED 30 tablet 5  . bimatoprost (LATISSE) 0.03 % ophthalmic solution APPLY 1 DROP TO UPPER EYE LID AT BEDTIME AS DIRECTED 5 mL 6  . Clindamycin-Benzoyl Per, Refr, gel APPLY TO THE AFFECTED AREA(S) DAILY 45 g 1  . cyanocobalamin (,VITAMIN B-12,) 1000 MCG/ML injection INJECT 1ML DAILY FOR 3 DAYS, THEN WEEKLY X 4 WEEKS THEN MONTHLY THEREAFTER 10 mL 0  . desloratadine (CLARINEX) 5 MG tablet TAKE 1 TABLET (5 MG TOTAL) BY MOUTH DAILY. 90 tablet 0  . famotidine (PEPCID) 20 MG tablet TAKE 2 TABLETS BY MOUTH TWICE DAILY 120 tablet 5  . lidocaine-prilocaine (EMLA) cream APPLY TOPICALLY AS NEEDED. 30 g 3  . Liraglutide -Weight Management (SAXENDA) 18 MG/3ML SOPN Inject 3 mg into the skin daily. 9 mL 11  . metoprolol succinate (TOPROL-XL) 100 MG 24 hr tablet TAKE 1 TABLET BY MOUTH DAILY. TAKE WITH OR IMMEDIATELY FOLLOWING A MEAL. 90 tablet 3  . montelukast (SINGULAIR) 10 MG tablet TAKE 1 TABLET (10 MG TOTAL) BY MOUTH AT BEDTIME. 90 tablet 0  . Multiple Vitamins-Minerals (ADULT GUMMY PO) Take 2 tablets by mouth daily.    . SUMAtriptan (IMITREX) 100 MG tablet TAKE 1 TABLET BY MOUTH DAILY AS NEEDED. 10 tablet 5  . Syringe/Needle, Disp, (SYRINGE 3CC/25GX1") 25G X 1" 3 ML MISC Use for b12 injections 50 each 0  . traMADol (ULTRAM) 50 MG tablet TAKE ONE TABLET BY MOUTH EVERY 6 HOURS AS NEEDED 120 tablet 5  . Vitamin D, Ergocalciferol, (DRISDOL) 50000 units CAPS capsule TAKE 1 CAPSULE BY MOUTH EVERY 7 DAYS ON SUNDAYS 12 capsule 3  . clonazePAM (KLONOPIN) 1 MG tablet TAKE 1 TABLET BY MOUTH TWICE DAILY 60 tablet 5  . cyclobenzaprine (FLEXERIL) 10 MG tablet     . omeprazole (PRILOSEC) 40 MG capsule Take 1 capsule (40 mg total) by mouth 2 (two) times  daily before a meal. 180 capsule 5  . celecoxib (CELEBREX) 200 MG capsule TAKE 1 CAPSULE BY MOUTH 2 TIMES DAILY AS NEEDED FOR PAIN (Patient not taking: Reported on 05/20/2018) 60 capsule 2  . Clindamycin-Benzoyl Per, Refr, gel APPLY TO THE AFFECTED AREA(S) DAILY (Patient not taking: Reported on 05/20/2018) 45 g 1  . DYMISTA 137-50 MCG/ACT SUSP PLACE 1 SPRAY INTO THE NOSE 2 TIMES DAILY AS NEEDED (Patient not taking: Reported on 05/20/2018) 69 g 11  . ferrous sulfate 325 (65 FE) MG tablet One tablet daily with orange juice (Patient not taking: Reported on 05/20/2018) 30 tablet 5  . methocarbamol (ROBAXIN) 500 MG tablet   1  . oxyCODONE (ROXICODONE) 5 MG immediate release tablet Take 1 tablet (5 mg total) by mouth every 4 (four) hours as needed for severe pain. (Patient not taking: Reported on 05/20/2018) 30 tablet 0  . potassium chloride SA (K-DUR,KLOR-CON) 20 MEQ tablet Take 1 tablet (20 mEq total) by mouth daily. For 5 days, then as needed for low potassium (Patient not taking: Reported on 05/20/2018) 30 tablet 0   No facility-administered medications prior to visit.     Review of Systems   Patient denies headache, fevers, malaise, unintentional weight loss, skin rash, eye pain, sinus congestion  and sinus pain, sore throat, dysphagia,  hemoptysis , cough, dyspnea, wheezing, chest pain, palpitations, orthopnea, edema, abdominal pain, nausea, melena, diarrhea, constipation, flank pain, dysuria, hematuria, urinary  Frequency, nocturia, numbness, tingling, seizures,  Focal weakness, Loss of consciousness,  Tremor, insomnia, depression, anxiety, and suicidal ideation.      Objective:  BP 102/64 (BP Location: Left Arm, Patient Position: Sitting, Cuff Size: Large)   Pulse 80   Temp 97.8 F (36.6 C) (Oral)   Resp 14   Ht 5\' 7"  (1.702 m)   Wt 206 lb (93.4 kg)   SpO2 98%   BMI 32.26 kg/m   Physical Exam   General appearance: alert, cooperative and appears stated age Ears: normal TM's and external ear  canals both ears Throat: lips, mucosa, and tongue normal; teeth and gums normal Neck: no adenopathy, no carotid bruit, supple, symmetrical, trachea midline and thyroid not enlarged, symmetric, no tenderness/mass/nodules Back: symmetric, no curvature. ROM normal. No CVA tenderness. Lungs: clear to auscultation bilaterally Heart: regular rate and rhythm, S1, S2 normal, no murmur, click, rub or gallop Abdomen: soft, non-tender; bowel sounds normal; no masses,  no organomegaly Pulses: 2+ and symmetric Skin: Skin color, texture, turgor normal. No rashes or lesions Lymph nodes: Cervical, supraclavicular, and axillary nodes normal.   Assessment & Plan:   Problem List Items Addressed This Visit    Vitamin D deficiency - Primary   Relevant Orders   VITAMIN D 25 Hydroxy (Vit-D Deficiency, Fractures)   Sciatica of left side associated with disorder of lumbosacral spine    She has chronic pain despite decompressive surgery that is aggravated at times by prolonged wearing of the lead apron on OR days.  She  Has had increased pain for the last 2 days and is requesting another day of rest before returning to work.  Plain films done to evaluate positioning of rods that have reportedly become loose.       Relevant Medications   cyclobenzaprine (FLEXERIL) 10 MG tablet   clonazePAM (KLONOPIN) 1 MG tablet   Iron deficiency anemia   Relevant Orders   Iron, TIBC and Ferritin Panel   Encounter for preventive health examination    age appropriate education and counseling updated, referrals for preventative services and immunizations addressed, dietary and smoking counseling addressed, most recent labs reviewed.  I have personally reviewed and have noted:  1) the patient's medical and social history 2) The pt's use of alcohol, tobacco, and illicit drugs 3) The patient's current medications and supplements 4) Functional ability including ADL's, fall risk, home safety risk, hearing and visual impairment 5)  Diet and physical activities 6) Evidence for depression or mood disorder 7) The patient's height, weight, and BMI have been recorded in the chart  I have made referrals, and provided counseling and education based on review of the above       Other Visit Diagnoses    B12 deficiency       Relevant Orders   Vitamin B12   Screening for hyperlipidemia       Relevant Orders   Lipid panel   Other fatigue       Relevant Orders   Comprehensive metabolic panel   TSH   Acute midline low back pain with right-sided sciatica       Relevant Medications   cyclobenzaprine (FLEXERIL) 10 MG tablet   Other Relevant Orders   DG Lumbar Spine Complete   History of atypical nevus       Relevant Orders  Ambulatory referral to Dermatology      I have discontinued Joelene Millin A. Jacquet "[name]"'s ferrous sulfate, methocarbamol, potassium chloride SA, celecoxib, oxyCODONE, and DYMISTA. I have also changed her clonazePAM. Additionally, I am having her start on mometasone. Lastly, I am having her maintain her albuterol, Multiple Vitamins-Minerals (ADULT GUMMY PO), Clindamycin-Benzoyl Per (Refr), Vitamin D (Ergocalciferol), SYRINGE 3CC/25GX1", Liraglutide -Weight Management, omeprazole, cyanocobalamin, SUMAtriptan, lidocaine-prilocaine, traMADol, montelukast, desloratadine, alprazolam, famotidine, metoprolol succinate, bimatoprost, and cyclobenzaprine.  Meds ordered this encounter  Medications  . clonazePAM (KLONOPIN) 1 MG tablet    Sig: Take 1 tablet (1 mg total) by mouth 2 (two) times daily.    Dispense:  60 tablet    Refill:  5  . DISCONTD: Azelastine HCl 0.15 % SOLN    Sig: Place 1 spray into the nose 2 (two) times daily.    Dispense:  30 mL    Refill:  11  . mometasone (NASONEX) 50 MCG/ACT nasal spray    Sig: Place 2 sprays into the nose daily.    Dispense:  17 g    Refill:  12    Medications Discontinued During This Encounter  Medication Reason  . celecoxib (CELEBREX) 200 MG capsule Patient  has not taken in last 30 days  . Clindamycin-Benzoyl Per, Refr, gel Duplicate  . DYMISTA 137-50 MCG/ACT SUSP Patient has not taken in last 30 days  . ferrous sulfate 325 (65 FE) MG tablet Patient has not taken in last 30 days  . methocarbamol (ROBAXIN) 500 MG tablet Patient has not taken in last 30 days  . oxyCODONE (ROXICODONE) 5 MG immediate release tablet Patient has not taken in last 30 days  . potassium chloride SA (K-DUR,KLOR-CON) 20 MEQ tablet Patient has not taken in last 30 days  . clonazePAM (KLONOPIN) 1 MG tablet Reorder    Follow-up: No follow-ups on file.   Crecencio Mc, MD

## 2018-05-20 NOTE — Assessment & Plan Note (Signed)

## 2018-05-20 NOTE — Patient Instructions (Signed)
I have prescribed azelastine and mometasone as a covered alternative to Dymista  I have refilled the clonazepam for 6 months  Return for fasting labs and x rays of lower back   Health Maintenance, Female Adopting a healthy lifestyle and getting preventive care can go a long way to promote health and wellness. Talk with your health care provider about what schedule of regular examinations is right for you. This is a good chance for you to check in with your provider about disease prevention and staying healthy. In between checkups, there are plenty of things you can do on your own. Experts have done a lot of research about which lifestyle changes and preventive measures are most likely to keep you healthy. Ask your health care provider for more information. Weight and diet Eat a healthy diet  Be sure to include plenty of vegetables, fruits, low-fat dairy products, and lean protein.  Do not eat a lot of foods high in solid fats, added sugars, or salt.  Get regular exercise. This is one of the most important things you can do for your health. ? Most adults should exercise for at least 150 minutes each week. The exercise should increase your heart rate and make you sweat (moderate-intensity exercise). ? Most adults should also do strengthening exercises at least twice a week. This is in addition to the moderate-intensity exercise. Maintain a healthy weight  Body mass index (BMI) is a measurement that can be used to identify possible weight problems. It estimates body fat based on height and weight. Your health care provider can help determine your BMI and help you achieve or maintain a healthy weight.  For females 38 years of age and older: ? A BMI below 18.5 is considered underweight. ? A BMI of 18.5 to 24.9 is normal. ? A BMI of 25 to 29.9 is considered overweight. ? A BMI of 30 and above is considered obese. Watch levels of cholesterol and blood lipids  You should start having your blood  tested for lipids and cholesterol at 40 years of age, then have this test every 5 years.  You may need to have your cholesterol levels checked more often if: ? Your lipid or cholesterol levels are high. ? You are older than 40 years of age. ? You are at high risk for heart disease. Cancer screening Lung Cancer  Lung cancer screening is recommended for adults 47-1 years old who are at high risk for lung cancer because of a history of smoking.  A yearly low-dose CT scan of the lungs is recommended for people who: ? Currently smoke. ? Have quit within the past 15 years. ? Have at least a 30-pack-year history of smoking. A pack year is smoking an average of one pack of cigarettes a day for 1 year.  Yearly screening should continue until it has been 15 years since you quit.  Yearly screening should stop if you develop a health problem that would prevent you from having lung cancer treatment. Breast Cancer  Practice breast self-awareness. This means understanding how your breasts normally appear and feel.  It also means doing regular breast self-exams. Let your health care provider know about any changes, no matter how small.  If you are in your 20s or 30s, you should have a clinical breast exam (CBE) by a health care provider every 1-3 years as part of a regular health exam.  If you are 60 or older, have a CBE every year. Also consider having a breast  X-ray (mammogram) every year.  If you have a family history of breast cancer, talk to your health care provider about genetic screening.  If you are at high risk for breast cancer, talk to your health care provider about having an MRI and a mammogram every year.  Breast cancer gene (BRCA) assessment is recommended for women who have family members with BRCA-related cancers. BRCA-related cancers include: ? Breast. ? Ovarian. ? Tubal. ? Peritoneal cancers.  Results of the assessment will determine the need for genetic counseling and  BRCA1 and BRCA2 testing. Cervical Cancer Your health care provider may recommend that you be screened regularly for cancer of the pelvic organs (ovaries, uterus, and vagina). This screening involves a pelvic examination, including checking for microscopic changes to the surface of your cervix (Pap test). You may be encouraged to have this screening done every 3 years, beginning at age 6.  For women ages 38-65, health care providers may recommend pelvic exams and Pap testing every 3 years, or they may recommend the Pap and pelvic exam, combined with testing for human papilloma virus (HPV), every 5 years. Some types of HPV increase your risk of cervical cancer. Testing for HPV may also be done on women of any age with unclear Pap test results.  Other health care providers may not recommend any screening for nonpregnant women who are considered low risk for pelvic cancer and who do not have symptoms. Ask your health care provider if a screening pelvic exam is right for you.  If you have had past treatment for cervical cancer or a condition that could lead to cancer, you need Pap tests and screening for cancer for at least 20 years after your treatment. If Pap tests have been discontinued, your risk factors (such as having a new sexual partner) need to be reassessed to determine if screening should resume. Some women have medical problems that increase the chance of getting cervical cancer. In these cases, your health care provider may recommend more frequent screening and Pap tests. Colorectal Cancer  This type of cancer can be detected and often prevented.  Routine colorectal cancer screening usually begins at 40 years of age and continues through 40 years of age.  Your health care provider may recommend screening at an earlier age if you have risk factors for colon cancer.  Your health care provider may also recommend using home test kits to check for hidden blood in the stool.  A small camera at  the end of a tube can be used to examine your colon directly (sigmoidoscopy or colonoscopy). This is done to check for the earliest forms of colorectal cancer.  Routine screening usually begins at age 36.  Direct examination of the colon should be repeated every 5-10 years through 40 years of age. However, you may need to be screened more often if early forms of precancerous polyps or small growths are found. Skin Cancer  Check your skin from head to toe regularly.  Tell your health care provider about any new moles or changes in moles, especially if there is a change in a mole's shape or color.  Also tell your health care provider if you have a mole that is larger than the size of a pencil eraser.  Always use sunscreen. Apply sunscreen liberally and repeatedly throughout the day.  Protect yourself by wearing long sleeves, pants, a wide-brimmed hat, and sunglasses whenever you are outside. Heart disease, diabetes, and high blood pressure  High blood pressure causes heart disease  and increases the risk of stroke. High blood pressure is more likely to develop in: ? People who have blood pressure in the high end of the normal range (130-139/85-89 mm Hg). ? People who are overweight or obese. ? People who are African American.  If you are 71-69 years of age, have your blood pressure checked every 3-5 years. If you are 59 years of age or older, have your blood pressure checked every year. You should have your blood pressure measured twice-once when you are at a hospital or clinic, and once when you are not at a hospital or clinic. Record the average of the two measurements. To check your blood pressure when you are not at a hospital or clinic, you can use: ? An automated blood pressure machine at a pharmacy. ? A home blood pressure monitor.  If you are between 92 years and 40 years old, ask your health care provider if you should take aspirin to prevent strokes.  Have regular diabetes  screenings. This involves taking a blood sample to check your fasting blood sugar level. ? If you are at a normal weight and have a low risk for diabetes, have this test once every three years after 40 years of age. ? If you are overweight and have a high risk for diabetes, consider being tested at a younger age or more often. Preventing infection Hepatitis B  If you have a higher risk for hepatitis B, you should be screened for this virus. You are considered at high risk for hepatitis B if: ? You were born in a country where hepatitis B is common. Ask your health care provider which countries are considered high risk. ? Your parents were born in a high-risk country, and you have not been immunized against hepatitis B (hepatitis B vaccine). ? You have HIV or AIDS. ? You use needles to inject street drugs. ? You live with someone who has hepatitis B. ? You have had sex with someone who has hepatitis B. ? You get hemodialysis treatment. ? You take certain medicines for conditions, including cancer, organ transplantation, and autoimmune conditions. Hepatitis C  Blood testing is recommended for: ? Everyone born from [birth year] through 1967. ? Anyone with known risk factors for hepatitis C. Sexually transmitted infections (STIs)  You should be screened for sexually transmitted infections (STIs) including gonorrhea and chlamydia if: ? You are sexually active and are younger than 40 years of age. ? You are older than 40 years of age and your health care provider tells you that you are at risk for this type of infection. ? Your sexual activity has changed since you were last screened and you are at an increased risk for chlamydia or gonorrhea. Ask your health care provider if you are at risk.  If you do not have HIV, but are at risk, it may be recommended that you take a prescription medicine daily to prevent HIV infection. This is called pre-exposure prophylaxis (PrEP). You are considered at risk  if: ? You are sexually active and do not regularly use condoms or know the HIV status of your partner(s). ? You take drugs by injection. ? You are sexually active with a partner who has HIV. Talk with your health care provider about whether you are at high risk of being infected with HIV. If you choose to begin PrEP, you should first be tested for HIV. You should then be tested every 3 months for as long as you are taking PrEP. Pregnancy  If you are premenopausal and you may become pregnant, ask your health care provider about preconception counseling.  If you may become pregnant, take 400 to 800 micrograms (mcg) of folic acid every day.  If you want to prevent pregnancy, talk to your health care provider about birth control (contraception). Osteoporosis and menopause  Osteoporosis is a disease in which the bones lose minerals and strength with aging. This can result in serious bone fractures. Your risk for osteoporosis can be identified using a bone density scan.  If you are 107 years of age or older, or if you are at risk for osteoporosis and fractures, ask your health care provider if you should be screened.  Ask your health care provider whether you should take a calcium or vitamin D supplement to lower your risk for osteoporosis.  Menopause may have certain physical symptoms and risks.  Hormone replacement therapy may reduce some of these symptoms and risks. Talk to your health care provider about whether hormone replacement therapy is right for you. Follow these instructions at home:  Schedule regular health, dental, and eye exams.  Stay current with your immunizations.  Do not use any tobacco products including cigarettes, chewing tobacco, or electronic cigarettes.  If you are pregnant, do not drink alcohol.  If you are breastfeeding, limit how much and how often you drink alcohol.  Limit alcohol intake to no more than 1 drink per day for nonpregnant women. One drink equals  12 ounces of beer, 5 ounces of wine, or 1 ounces of hard liquor.  Do not use street drugs.  Do not share needles.  Ask your health care provider for help if you need support or information about quitting drugs.  Tell your health care provider if you often feel depressed.  Tell your health care provider if you have ever been abused or do not feel safe at home. This information is not intended to replace advice given to you by your health care provider. Make sure you discuss any questions you have with your health care provider. Document Released: 09/19/2010 Document Revised: 08/12/2015 Document Reviewed: 12/08/2014 Elsevier Interactive Patient Education  2019 [employer].

## 2018-05-21 ENCOUNTER — Telehealth: Payer: Self-pay

## 2018-05-21 MED ORDER — AZELASTINE HCL 0.1 % NA SOLN: 2.0000 | Freq: Two times a day (BID) | NASAL | 12 refills | Status: DC

## 2018-05-21 NOTE — Telephone Encounter (Signed)
astelin sent

## 2018-05-21 NOTE — Assessment & Plan Note (Signed)
I have congratulated her in reduction of   BMI and encouraged  Continued weight loss with goal of 10% of body weigh over the next 6 months using a low glycemic index diet and encouraged her to begin a regular exercise program a minimum of 5 days per week.

## 2018-05-21 NOTE — Telephone Encounter (Signed)
Azelastine nasal spray has been denied by pt's insurance because she will will need to try Astelin 137mg  first.

## 2018-05-21 NOTE — Assessment & Plan Note (Signed)
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  She  Has had increased pain for the last 2 days and is requesting another day of rest before returning to work.  Plain films done to evaluate positioning of rods that have reportedly become loose.

## 2018-05-21 NOTE — Addendum Note (Signed)
Addended by: Crecencio Mc on: 05/21/2018 03:23 PM   Modules accepted: Orders

## 2018-05-23 ENCOUNTER — Ambulatory Visit (INDEPENDENT_AMBULATORY_CARE_PROVIDER_SITE_OTHER): Payer: 59

## 2018-05-23 ENCOUNTER — Other Ambulatory Visit (INDEPENDENT_AMBULATORY_CARE_PROVIDER_SITE_OTHER): Payer: 59

## 2018-05-23 DIAGNOSIS — E538 Deficiency of other specified B group vitamins: Secondary | ICD-10-CM | POA: Diagnosis not present

## 2018-05-23 DIAGNOSIS — M5441 Lumbago with sciatica, right side: Secondary | ICD-10-CM | POA: Diagnosis not present

## 2018-05-23 DIAGNOSIS — E559 Vitamin D deficiency, unspecified: Secondary | ICD-10-CM | POA: Diagnosis not present

## 2018-05-23 DIAGNOSIS — Z13220 Encounter for screening for lipoid disorders: Secondary | ICD-10-CM

## 2018-05-23 DIAGNOSIS — Z981 Arthrodesis status: Secondary | ICD-10-CM | POA: Diagnosis not present

## 2018-05-23 DIAGNOSIS — D500 Iron deficiency anemia secondary to blood loss (chronic): Secondary | ICD-10-CM | POA: Diagnosis not present

## 2018-05-23 DIAGNOSIS — M545 Low back pain: Secondary | ICD-10-CM | POA: Diagnosis not present

## 2018-05-23 DIAGNOSIS — R5383 Other fatigue: Secondary | ICD-10-CM | POA: Diagnosis not present

## 2018-05-23 LAB — COMPREHENSIVE METABOLIC PANEL
ALT: 160 U/L — ABNORMAL HIGH (ref 0–35)
AST: 207 U/L — ABNORMAL HIGH (ref 0–37)
Albumin: 3.8 g/dL (ref 3.5–5.2)
Alkaline Phosphatase: 158 U/L — ABNORMAL HIGH (ref 39–117)
BUN: 6 mg/dL (ref 6–23)
CO2: 30 mEq/L (ref 19–32)
Calcium: 8.5 mg/dL (ref 8.4–10.5)
Chloride: 104 mEq/L (ref 96–112)
Creatinine, Ser: 0.66 mg/dL (ref 0.40–1.20)
GFR: 99.55 mL/min (ref >60.00)
Glucose, Bld: 75 mg/dL (ref 70–99)
Potassium: 3.7 mEq/L (ref 3.5–5.1)
Sodium: 139 mEq/L (ref 135–145)
Total Bilirubin: 0.5 mg/dL (ref 0.2–1.2)
Total Protein: 6.4 g/dL (ref 6.0–8.3)

## 2018-05-23 LAB — LIPID PANEL
Cholesterol: 200 mg/dL (ref 0–200)
HDL: 70.3 mg/dL (ref >39.00)
LDL Cholesterol: 110 mg/dL — ABNORMAL HIGH (ref 0–99)
NonHDL: 129.88
Total CHOL/HDL Ratio: 3
Triglycerides: 101 mg/dL (ref 0.0–149.0)
VLDL: 20.2 mg/dL (ref 0.0–40.0)

## 2018-05-23 LAB — VITAMIN D 25 HYDROXY (VIT D DEFICIENCY, FRACTURES): VITD: 48.76 ng/mL (ref 30.00–100.00)

## 2018-05-23 LAB — VITAMIN B12: Vitamin B-12: 1202 pg/mL — ABNORMAL HIGH (ref 211–911)

## 2018-05-23 LAB — TSH: TSH: 4.16 u[IU]/mL (ref 0.35–4.50)

## 2018-05-24 LAB — IRON,TIBC AND FERRITIN PANEL
%SAT: 30 % (calc) (ref 16–45)
Ferritin: 16 ng/mL (ref 16–154)
Iron: 116 ug/dL (ref 40–190)
TIBC: 390 mcg/dL (calc) (ref 250–450)

## 2018-05-26 ENCOUNTER — Other Ambulatory Visit: Payer: Self-pay | Admitting: Internal Medicine

## 2018-05-26 DIAGNOSIS — R748 Abnormal levels of other serum enzymes: Secondary | ICD-10-CM

## 2018-05-27 ENCOUNTER — Other Ambulatory Visit: Payer: Self-pay | Admitting: Internal Medicine

## 2018-05-27 ENCOUNTER — Other Ambulatory Visit: Payer: Self-pay | Admitting: Urology

## 2018-05-27 ENCOUNTER — Other Ambulatory Visit: Payer: Self-pay

## 2018-06-03 ENCOUNTER — Other Ambulatory Visit
Admission: RE | Admit: 2018-06-03 | Discharge: 2018-06-03 | Disposition: A | Discharge status: Home / Self Care | Payer: 59 | Source: Ambulatory Visit | Attending: Internal Medicine | Admitting: Internal Medicine

## 2018-06-03 DIAGNOSIS — R748 Abnormal levels of other serum enzymes: Secondary | ICD-10-CM | POA: Insufficient documentation

## 2018-06-03 LAB — HEPATIC FUNCTION PANEL
ALT: 22 U/L (ref 0–44)
AST: 24 U/L (ref 15–41)
Albumin: 4.4 g/dL (ref 3.5–5.0)
Alkaline Phosphatase: 77 U/L (ref 38–126)
Bilirubin, Direct: 0.1 mg/dL (ref 0.0–0.2)
Indirect Bilirubin: 0.6 mg/dL (ref 0.3–0.9)
Total Bilirubin: 0.7 mg/dL (ref 0.3–1.2)
Total Protein: 7.2 g/dL (ref 6.5–8.1)

## 2018-06-03 LAB — IRON AND TIBC
Iron: 68 ug/dL (ref 28–170)
Saturation Ratios: 14 % (ref 10.4–31.8)
TIBC: 484 ug/dL — ABNORMAL HIGH (ref 250–450)
UIBC: 416 ug/dL

## 2018-06-03 LAB — COMPREHENSIVE METABOLIC PANEL
ALT: 22 U/L (ref 0–44)
AST: 24 U/L (ref 15–41)
Albumin: 4.3 g/dL (ref 3.5–5.0)
Alkaline Phosphatase: 77 U/L (ref 38–126)
Anion gap: 10 (ref 5–15)
BUN: 9 mg/dL (ref 6–20)
CO2: 25 mmol/L (ref 22–32)
Calcium: 9.2 mg/dL (ref 8.9–10.3)
Chloride: 107 mmol/L (ref 98–111)
Creatinine, Ser: 0.56 mg/dL (ref 0.44–1.00)
GFR calc Af Amer: >60 mL/min (ref >60)
GFR calc non Af Amer: >60 mL/min (ref >60)
Glucose, Bld: 95 mg/dL (ref 70–99)
Potassium: 3.8 mmol/L (ref 3.5–5.1)
Sodium: 142 mmol/L (ref 135–145)
Total Bilirubin: 0.3 mg/dL (ref 0.3–1.2)
Total Protein: 7.4 g/dL (ref 6.5–8.1)

## 2018-06-03 LAB — HEMOGLOBIN A1C
Hgb A1c MFr Bld: 4.9 % (ref 4.8–5.6)
Mean Plasma Glucose: 93.93 mg/dL

## 2018-06-03 LAB — FERRITIN: Ferritin: 9 ng/mL — ABNORMAL LOW (ref 11–307)

## 2018-06-03 NOTE — Addendum Note (Signed)
Addended by: Santiago Bur on: 06/03/2018 11:16 AM   Modules accepted: Orders

## 2018-06-03 NOTE — Addendum Note (Signed)
Addended by: Santiago Bur on: 06/03/2018 11:17 AM   Modules accepted: Orders

## 2018-06-03 NOTE — Addendum Note (Signed)
Addended by: Santiago Bur on: 06/03/2018 11:18 AM   Modules accepted: Orders

## 2018-06-04 LAB — ANTI-SMITH ANTIBODY: ENA SM Ab Ser-aCnc: <0.2 AI (ref 0.0–0.9)

## 2018-06-04 LAB — MITOCHONDRIAL ANTIBODIES: Mitochondrial M2 Ab, IgG: <20 Units (ref 0.0–20.0)

## 2018-06-04 LAB — ANA: Anti Nuclear Antibody(ANA): NEGATIVE

## 2018-06-04 LAB — HEPATITIS A ANTIBODY, IGM: Hep A IgM: NEGATIVE

## 2018-06-04 LAB — HEPATITIS C ANTIBODY: HCV Ab: <0.1 s/co ratio (ref 0.0–0.9)

## 2018-06-04 LAB — HEPATITIS B SURFACE ANTIBODY,QUALITATIVE: Hep B S Ab: REACTIVE

## 2018-06-04 LAB — HEPATITIS B SURFACE ANTIGEN: Hepatitis B Surface Ag: NEGATIVE

## 2018-06-04 LAB — HEPATITIS B CORE ANTIBODY, TOTAL: Hep B Core Total Ab: NEGATIVE

## 2018-06-07 ENCOUNTER — Other Ambulatory Visit: Payer: Self-pay | Admitting: Internal Medicine

## 2018-06-07 NOTE — Telephone Encounter (Signed)
Last OV 05/20/2018  Last refilled 12/14/2017 disp 120 with 5 refills   Next OV none scheduled

## 2018-07-09 ENCOUNTER — Encounter: Payer: Self-pay | Admitting: Urology

## 2018-07-10 ENCOUNTER — Telehealth: Payer: Self-pay

## 2018-07-10 ENCOUNTER — Other Ambulatory Visit: Payer: Self-pay | Admitting: Internal Medicine

## 2018-07-10 MED ORDER — KETOROLAC TROMETHAMINE 15.75 MG/SPRAY NA SOLN: 15.7500 mg | NASAL | 3 refills | Status: DC | PRN

## 2018-07-10 MED ORDER — VITAMIN D (ERGOCALCIFEROL) 1.25 MG (50000 UNIT) PO CAPS: ORAL_CAPSULE | ORAL | 3 refills | Status: DC

## 2018-07-10 MED ORDER — TAMSULOSIN HCL 0.4 MG PO CAPS: 0.4000 mg | ORAL_CAPSULE | ORAL | 6 refills | Status: DC | PRN

## 2018-07-10 MED ORDER — CYANOCOBALAMIN 1000 MCG/ML IJ SOLN: 1000.0000 ug | INTRAMUSCULAR | 3 refills | Status: DC

## 2018-07-10 NOTE — Telephone Encounter (Signed)
Scripts sent and mychart notification sent

## 2018-07-16 ENCOUNTER — Telehealth: Payer: Self-pay

## 2018-07-16 NOTE — Telephone Encounter (Signed)
Incoming fax from [facility] stating that they have been trying to reach out to patient in order to get Sprix mailed to her.   Called pt no answer. Left detailed message for her informing her of the information above, gave pt number to call back to get her shipment mailed. Advised pt to call back for questions or concerns.

## 2018-07-22 DIAGNOSIS — N921 Excessive and frequent menstruation with irregular cycle: Secondary | ICD-10-CM | POA: Diagnosis not present

## 2018-07-22 DIAGNOSIS — Z975 Presence of (intrauterine) contraceptive device: Secondary | ICD-10-CM | POA: Diagnosis not present

## 2018-07-22 DIAGNOSIS — Z01419 Encounter for gynecological examination (general) (routine) without abnormal findings: Secondary | ICD-10-CM | POA: Diagnosis not present

## 2018-07-22 DIAGNOSIS — Z3046 Encounter for surveillance of implantable subdermal contraceptive: Secondary | ICD-10-CM | POA: Diagnosis not present

## 2018-08-14 ENCOUNTER — Other Ambulatory Visit: Payer: Self-pay | Admitting: Internal Medicine

## 2018-08-15 ENCOUNTER — Other Ambulatory Visit: Payer: Self-pay

## 2018-08-15 NOTE — Telephone Encounter (Signed)
Refilled: 02/28/2018 Last OV: 05/20/2018 Next OV: not scheduled

## 2018-08-16 MED ORDER — "SYRINGE 25G X 1"" 3 ML MISC": 0 refills | Status: DC

## 2018-08-16 MED ORDER — LIRAGLUTIDE -WEIGHT MANAGEMENT 18 MG/3ML ~~LOC~~ SOPN: 3.0000 mg | PEN_INJECTOR | Freq: Every day | SUBCUTANEOUS | 3 refills | Status: DC

## 2018-08-16 MED ORDER — ALBUTEROL SULFATE HFA 108 (90 BASE) MCG/ACT IN AERS: 2.0000 | INHALATION_SPRAY | RESPIRATORY_TRACT | 5 refills | Status: DC | PRN

## 2018-08-19 ENCOUNTER — Other Ambulatory Visit: Payer: Self-pay | Admitting: Internal Medicine

## 2018-08-20 NOTE — Telephone Encounter (Signed)
Refilled: 06/07/2018 Last OV: 05/20/2018 Next OV: not scheduled

## 2018-09-02 ENCOUNTER — Other Ambulatory Visit: Payer: Self-pay | Admitting: Internal Medicine

## 2018-09-10 ENCOUNTER — Telehealth: Payer: Self-pay

## 2018-09-10 ENCOUNTER — Ambulatory Visit (INDEPENDENT_AMBULATORY_CARE_PROVIDER_SITE_OTHER): Payer: 59

## 2018-09-10 ENCOUNTER — Other Ambulatory Visit: Payer: Self-pay | Admitting: Internal Medicine

## 2018-09-10 ENCOUNTER — Other Ambulatory Visit: Payer: Self-pay

## 2018-09-10 DIAGNOSIS — M545 Low back pain, unspecified: Secondary | ICD-10-CM

## 2018-09-10 MED ORDER — METHYLPREDNISOLONE ACETATE 40 MG/ML IJ SUSP: 40.0000 mg | Freq: Once | INTRAMUSCULAR | Status: DC

## 2018-09-10 MED ORDER — METHYLPREDNISOLONE ACETATE 40 MG/ML IJ SUSP
80.0000 mg | Freq: Once | INTRAMUSCULAR | Status: AC
  Administered 2018-09-10: 80 mg via INTRAMUSCULAR

## 2018-09-10 NOTE — Telephone Encounter (Signed)
-----   Message from Crecencio Mc, MD sent at 09/10/2018  9:38 AM EDT ----- Regarding: IM steroid injection Patient needs an IM steroid injection of 80 mg Depo Medrol for back pain,  can either of you call her to schedule?  She is in a lot of pain  Regards,   Deborra Medina, MD

## 2018-09-10 NOTE — Progress Notes (Signed)
Patient presented today for Depo -Medrol injection.  Administered IM in left deltoid.  Administered without incident.  Patient tolerated well with no signs of distress.

## 2018-09-10 NOTE — Telephone Encounter (Signed)
Pt is scheduled for a nurse visit this afternoon at 3:30 for 80mg  depo medrol injection. Pt is aware of appt date and time.

## 2018-10-15 ENCOUNTER — Other Ambulatory Visit: Payer: Self-pay

## 2018-10-15 ENCOUNTER — Other Ambulatory Visit: Payer: Self-pay | Admitting: Internal Medicine

## 2018-10-15 NOTE — Telephone Encounter (Signed)
Refilled: 08/20/2018 Last OV: 05/20/2018 Next OV: not scheduled

## 2018-10-16 ENCOUNTER — Other Ambulatory Visit: Payer: Self-pay | Admitting: Internal Medicine

## 2018-10-16 MED ORDER — CLINDAMYCIN PHOS-BENZOYL PEROX 1.2-5 % EX GEL: CUTANEOUS | 11 refills | Status: DC

## 2018-10-16 NOTE — Telephone Encounter (Signed)
Refilled: 08/15/2018 Last OV: 05/20/2018 Next OV: not scheduled

## 2018-10-16 NOTE — Telephone Encounter (Signed)
Gel refilled and sent

## 2018-10-23 ENCOUNTER — Other Ambulatory Visit: Payer: Self-pay | Admitting: Internal Medicine

## 2018-10-23 MED ORDER — "SYRINGE 25G X 1"" 3 ML MISC": 0 refills | Status: DC

## 2018-11-11 ENCOUNTER — Other Ambulatory Visit: Payer: Self-pay | Admitting: Internal Medicine

## 2018-11-11 NOTE — Telephone Encounter (Signed)
Refilled: 05/20/2018 Last OV: 05/20/2018 Next OV: not scheduled

## 2018-11-22 ENCOUNTER — Ambulatory Visit (INDEPENDENT_AMBULATORY_CARE_PROVIDER_SITE_OTHER): Payer: 59 | Admitting: Internal Medicine

## 2018-11-22 ENCOUNTER — Other Ambulatory Visit: Payer: Self-pay

## 2018-11-22 ENCOUNTER — Encounter: Payer: Self-pay | Admitting: Internal Medicine

## 2018-11-22 VITALS — Ht 67.0 in | Wt 206.0 lb

## 2018-11-22 DIAGNOSIS — Z6841 Body Mass Index (BMI) 40.0 and over, adult: Secondary | ICD-10-CM | POA: Diagnosis not present

## 2018-11-22 DIAGNOSIS — D500 Iron deficiency anemia secondary to blood loss (chronic): Secondary | ICD-10-CM

## 2018-11-22 DIAGNOSIS — F411 Generalized anxiety disorder: Secondary | ICD-10-CM | POA: Diagnosis not present

## 2018-11-22 MED ORDER — TRAMADOL HCL 50 MG PO TABS: 50.0000 mg | ORAL_TABLET | Freq: Four times a day (QID) | ORAL | 5 refills | Status: DC | PRN

## 2018-11-22 NOTE — Progress Notes (Signed)
Virtual Visit via [email or web address]  This visit type was conducted due to national recommendations for restrictions regarding the COVID-19 pandemic (e.g. social distancing).  This format is felt to be most appropriate for this patient at this time.  All issues noted in this document were discussed and addressed.  No physical exam was performed (except for noted visual exam findings with Video Visits).   I connected with@ on 11/22/18 at  9:00 AM EDT by a video enabled telemedicine applicationand verified that I am speaking with the correct person using two identifiers. Location patient: home Location provider: work or home office Persons participating in the virtual visit: patient, provider  I discussed the limitations, risks, security and privacy concerns of performing an evaluation and management service by telephone and the availability of in person appointments. I also discussed with the patient that there may be a patient responsible charge related to this service. The patient expressed understanding and agreed to proceed.  Reason for visit: follow up on chronic condition including chronic back pain and chronic anxiety with insomnia .    HPI:  Back pain:  She has had prior decompressive surgries for sciatica. Her pain is managed during work days without opioids and tramadol but is aggravated by prolonged work in the [place].  She uses toradol 2 mg every 4-6 hours for 5 days for flares  ,  Last depo  medrol shot  Wad done June 23 .  Last tramadol refill August 6.  Does not need more than 2 daily   Insomnia: managed with 1 mg clonazepam and 2 mg alprazolam every night  The risks and benefits of  Chronic  benzodiazepine use were discussed with patient today including increased risk of dementia,  Addiction, and seizures if abruptly withdrawn  . ROS: See pertinent positives and negatives per HPI.  Obesity:  She is using [place] to curb appetite. Weight loss has slowed down.  Net loss 53 lbs since Feb  2019    Past Medical History:  Diagnosis Date  . Abnormal Pap smear of cervix 2012  . Abortion 2001  . Allergy   . Anxiety    panic disorder  . Arthritis   . Asthma   . Gastritis 2005  . Hyperlipidemia   . Kidney stone    multiple  . Migraines   . Obesity   . Panic disorder   . Vitamin D deficiency disease     Past Surgical History:  Procedure Laterality Date  . BREAST BIOPSY Right 02/04/2001   Negative c Clip  . CHOLECYSTECTOMY  04/08/2012  . COLONOSCOPY WITH PROPOFOL N/A 07/12/2017   Procedure: COLONOSCOPY WITH PROPOFOL;  Surgeon: Lin Landsman, MD;  Location[facility] ENDOSCOPY;  Service: Gastroenterology;  Laterality: N/A;  . COLPOSCOPY  2012  . ESOPHAGOGASTRODUODENOSCOPY (EGD) WITH PROPOFOL N/A 07/12/2017   Procedure: ESOPHAGOGASTRODUODENOSCOPY (EGD) WITH PROPOFOL;  Surgeon: Lin Landsman, MD;  Location: [place];  Service: Gastroenterology;  Laterality: N/A;  . KNEE ARTHROSCOPY Left 05/01/2016   Procedure: ARTHROSCOPY KNEE LATERAL RELEASE;  Surgeon: Hessie Knows, MD;  Location: [facility] ORS;  Service: Orthopedics;  Laterality: Left;  . laprascopic sleeve  04/08/2012   GASTRIC  . SPINAL FUSION  09/25/2016    Family History  Problem Relation Age of Onset  . Cancer Maternal Grandmother        LUNG, THROAT  . Macular degeneration Maternal Grandmother   . Heart disease Paternal Grandmother   . Alzheimer's disease Paternal Grandmother   . Stroke Paternal Grandmother   .  Cancer Paternal Grandfather        COLON  . Diabetes Father   . Heart disease Father        atrial fib  . Hyperlipidemia Father   . Hypertension Father   . Cancer Father 17       colon polyps  . Colon polyps Father     SOCIAL HX:  reports that she has never smoked. She has never used smokeless tobacco. She reports current alcohol use of about 2.0 standard drinks of alcohol per week. She reports that she does not use drugs.   Current Outpatient Medications:  .  albuterol (VENTOLIN  HFA) 108 (90 Base) MCG/ACT inhaler, Inhale 2 puffs into the lungs every 4 (four) hours as needed for wheezing or shortness of breath., Disp: 1 Inhaler, Rfl: 5 .  alprazolam (XANAX) 2 MG tablet, TAKE 1 TABLET BY MOUTH ONCE DAILY AS NEEDED, Disp: 30 tablet, Rfl: 1 .  azelastine (ASTELIN) 0.1 % nasal spray, Place 2 sprays into both nostrils 2 (two) times daily. Use in each nostril as directed, Disp: 30 mL, Rfl: 12 .  bimatoprost (LATISSE) 0.03 % ophthalmic solution, APPLY 1 DROP TO UPPER EYE LID AT BEDTIME AS DIRECTED, Disp: 5 mL, Rfl: 6 .  Clindamycin-Benzoyl Per, Refr, gel, APPLY TO THE AFFECTED AREA(S) daily, Disp: 45 g, Rfl: 11 .  clonazePAM (KLONOPIN) 1 MG tablet, TAKE 1 TABLET BY MOUTH TWICE DAILY, Disp: 60 tablet, Rfl: 0 .  cyanocobalamin (,VITAMIN B-12,) 1000 MCG/ML injection, Inject 1 mL (1,000 mcg total) into the muscle every 30 (thirty) days., Disp: 3 mL, Rfl: 3 .  desloratadine (CLARINEX) 5 MG tablet, TAKE 1 TABLET (5 MG TOTAL) BY MOUTH DAILY., Disp: 90 tablet, Rfl: 1 .  Ketorolac Tromethamine (SPRIX) 15.75 MG/SPRAY SOLN, Place 15.75 mg into the nose as needed (1 spray in both nostrils every 6-8 hours as needed for kidney stone pain ** Maxium daily dose is 4 doses for up to 5 days)., Disp: 5 each, Rfl: 3 .  lidocaine-prilocaine (EMLA) cream, APPLY TOPICALLY AS NEEDED., Disp: 30 g, Rfl: 3 .  Liraglutide -Weight Management (SAXENDA) 18 MG/3ML SOPN, Inject 3 mg into the skin daily., Disp: 15 pen, Rfl: 3 .  metoprolol succinate (TOPROL-XL) 100 MG 24 hr tablet, TAKE 1 TABLET BY MOUTH DAILY. TAKE WITH OR IMMEDIATELY FOLLOWING A MEAL., Disp: 90 tablet, Rfl: 3 .  mometasone (NASONEX) 50 MCG/ACT nasal spray, Place 2 sprays into the nose daily., Disp: 17 g, Rfl: 12 .  montelukast (SINGULAIR) 10 MG tablet, TAKE 1 TABLET (10 MG TOTAL) BY MOUTH AT BEDTIME., Disp: 90 tablet, Rfl: 1 .  Multiple Vitamins-Minerals (ADULT GUMMY PO), Take 2 tablets by mouth daily., Disp: , Rfl:  .  SUMAtriptan (IMITREX) 100 MG  tablet, TAKE 1 TABLET BY MOUTH DAILY AS NEEDED., Disp: 10 tablet, Rfl: 5 .  Syringe/Needle, Disp, (SYRINGE 3CC/25GX1") 25G X 1" 3 ML MISC, Use for b12 injections, Disp: 50 each, Rfl: 0 .  tamsulosin (FLOMAX) 0.4 MG CAPS capsule, Take 1 capsule (0.4 mg total) by mouth as needed (for kidney stone)., Disp: 30 capsule, Rfl: 6 .  traMADol (ULTRAM) 50 MG tablet, Take 1 tablet (50 mg total) by mouth every 6 (six) hours as needed., Disp: 60 tablet, Rfl: 5 .  Vitamin D, Ergocalciferol, (DRISDOL) 1.25 MG (50000 UT) CAPS capsule, TAKE 1 CAPSULE BY MOUTH EVERY 7 DAYS ON SUNDAYS, Disp: 12 capsule, Rfl: 3 .  omeprazole (PRILOSEC) 40 MG capsule, Take 1 capsule (40 mg total) by  mouth 2 (two) times daily before a meal., Disp: 180 capsule, Rfl: 5  EXAM:  VITALS per patient if applicable:  GENERAL: alert, oriented, appears well and in no acute distress  HEENT: atraumatic, conjunttiva clear, no obvious abnormalities on inspection of external nose and ears  NECK: normal movements of the head and neck  LUNGS: on inspection no signs of respiratory distress, breathing rate appears normal, no obvious gross SOB, gasping or wheezing  CV: no obvious cyanosis  MS: moves all visible extremities without noticeable abnormality  PSYCH/NEURO: pleasant and cooperative, no obvious depression or anxiety, speech and thought processing grossly intact  ASSESSMENT AND PLAN:  Discussed the following assessment and plan:  Iron deficiency anemia due to chronic blood loss - Plan: CBC with Differential/Platelet, CANCELED: Iron Binding Cap (TIBC)([facility])  Generalized anxiety disorder  Class 3 severe obesity due to excess calories without serious comorbidity with body mass index (BMI) of 40.0 to 44.9 in adult [facility])  Generalized anxiety disorder  No longer taking temazepam.  Trial of valium.. Not receptive to SSRI trial due to past lack of success . Using 1 mg clonazepam twice daily  and 2 mg alprazolam at bedtime  daily  Advised to reduce use of xanax from 2 mg to 1.5 mg asap   Obesity I have congratulated her in reduction of   BMI and encouraged  Continued weight loss with goal of 10% of body weigh over the next 6 months using a low glycemic index diet and encouraged her to begin a regular exercise program a minimum of 5 days per week.  Continue Saxenda      I discussed the assessment and treatment plan with the patient. The patient was provided an opportunity to ask questions and all were answered. The patient agreed with the plan and demonstrated an understanding of the instructions.   The patient was advised to call back or seek an in-person evaluation if the symptoms worsen or if the condition fails to improve as anticipated.  I provided 25 minutes of non-face-to-face time during this encounter providing counselling on the above mentioned issues .   Crecencio Mc, MD

## 2018-11-25 NOTE — Assessment & Plan Note (Signed)
I have congratulated her in reduction of   BMI and encouraged  Continued weight loss with goal of 10% of body weigh over the next 6 months using a low glycemic index diet and encouraged her to begin a regular exercise program a minimum of 5 days per week.  Continue Saxenda

## 2018-11-25 NOTE — Assessment & Plan Note (Addendum)
No longer taking temazepam.  Trial of valium.. Not receptive to SSRI trial due to past lack of success . Using 1 mg clonazepam twice daily  and 2 mg alprazolam at bedtime daily  Advised to reduce use of xanax from 2 mg to 1.5 mg asap

## 2018-12-05 ENCOUNTER — Other Ambulatory Visit: Payer: Self-pay | Admitting: Internal Medicine

## 2018-12-05 ENCOUNTER — Other Ambulatory Visit: Payer: Self-pay

## 2018-12-05 DIAGNOSIS — D2261 Melanocytic nevi of right upper limb, including shoulder: Secondary | ICD-10-CM | POA: Diagnosis not present

## 2018-12-05 DIAGNOSIS — D225 Melanocytic nevi of trunk: Secondary | ICD-10-CM | POA: Diagnosis not present

## 2018-12-05 DIAGNOSIS — R12 Heartburn: Secondary | ICD-10-CM

## 2018-12-05 DIAGNOSIS — D2262 Melanocytic nevi of left upper limb, including shoulder: Secondary | ICD-10-CM | POA: Diagnosis not present

## 2018-12-05 DIAGNOSIS — D2271 Melanocytic nevi of right lower limb, including hip: Secondary | ICD-10-CM | POA: Diagnosis not present

## 2018-12-05 DIAGNOSIS — D2272 Melanocytic nevi of left lower limb, including hip: Secondary | ICD-10-CM | POA: Diagnosis not present

## 2018-12-06 ENCOUNTER — Other Ambulatory Visit: Payer: Self-pay | Admitting: Internal Medicine

## 2018-12-08 ENCOUNTER — Encounter: Payer: Self-pay | Admitting: Internal Medicine

## 2018-12-08 MED ORDER — CLONAZEPAM 1 MG PO TABS: 1.0000 mg | ORAL_TABLET | Freq: Two times a day (BID) | ORAL | 1 refills | Status: DC

## 2018-12-08 MED ORDER — ALPRAZOLAM 1 MG PO TABS: 1.5000 mg | ORAL_TABLET | Freq: Every evening | ORAL | 1 refills | Status: DC | PRN

## 2018-12-09 ENCOUNTER — Other Ambulatory Visit: Payer: Self-pay | Admitting: Internal Medicine

## 2018-12-09 DIAGNOSIS — H5202 Hypermetropia, left eye: Secondary | ICD-10-CM | POA: Diagnosis not present

## 2018-12-09 DIAGNOSIS — H5211 Myopia, right eye: Secondary | ICD-10-CM | POA: Diagnosis not present

## 2018-12-10 MED ORDER — CLONAZEPAM 1 MG PO TABS: 1.0000 mg | ORAL_TABLET | Freq: Two times a day (BID) | ORAL | 5 refills | Status: DC

## 2018-12-10 NOTE — Telephone Encounter (Signed)
clonazepam refilled

## 2018-12-24 ENCOUNTER — Telehealth: Payer: Self-pay

## 2018-12-24 NOTE — Telephone Encounter (Signed)
PA for saxenda was submitted on covermymeds and it stated that a PA was not needed.

## 2019-01-08 ENCOUNTER — Other Ambulatory Visit: Payer: Self-pay | Admitting: Internal Medicine

## 2019-01-29 MED ORDER — CLONAZEPAM 1 MG PO TABS: 1.0000 mg | ORAL_TABLET | Freq: Two times a day (BID) | ORAL | 0 refills | Status: DC

## 2019-01-30 DIAGNOSIS — Z20828 Contact with and (suspected) exposure to other viral communicable diseases: Secondary | ICD-10-CM | POA: Diagnosis not present

## 2019-02-03 DIAGNOSIS — Z20828 Contact with and (suspected) exposure to other viral communicable diseases: Secondary | ICD-10-CM | POA: Diagnosis not present

## 2019-02-04 ENCOUNTER — Other Ambulatory Visit: Payer: Self-pay | Admitting: Internal Medicine

## 2019-02-04 MED ORDER — CLONAZEPAM 1 MG PO TABS: 1.0000 mg | ORAL_TABLET | Freq: Two times a day (BID) | ORAL | 0 refills | Status: DC

## 2019-02-28 ENCOUNTER — Other Ambulatory Visit: Payer: Self-pay | Admitting: Internal Medicine

## 2019-02-28 ENCOUNTER — Telehealth: Payer: Self-pay

## 2019-02-28 ENCOUNTER — Other Ambulatory Visit
Admission: RE | Admit: 2019-02-28 | Discharge: 2019-02-28 | Disposition: A | Discharge status: Home / Self Care | Payer: 59 | Source: Ambulatory Visit | Attending: Internal Medicine | Admitting: Internal Medicine

## 2019-02-28 DIAGNOSIS — Z20822 Contact with and (suspected) exposure to covid-19: Secondary | ICD-10-CM

## 2019-02-28 DIAGNOSIS — Z20828 Contact with and (suspected) exposure to other viral communicable diseases: Secondary | ICD-10-CM | POA: Diagnosis not present

## 2019-02-28 DIAGNOSIS — Z0184 Encounter for antibody response examination: Secondary | ICD-10-CM | POA: Diagnosis not present

## 2019-02-28 LAB — SAR COV2 SEROLOGY (COVID19)AB(IGG),IA: SARS-CoV-2 Ab, IgG: NONREACTIVE

## 2019-02-28 NOTE — Telephone Encounter (Signed)
Orders have been per Dr. Derrel Nip verbal order.

## 2019-03-01 LAB — MISC LABCORP TEST (SEND OUT)
Labcorp test code: 164034
Labcorp test code: 164072

## 2019-03-04 ENCOUNTER — Other Ambulatory Visit: Payer: Self-pay | Admitting: Internal Medicine

## 2019-03-05 DIAGNOSIS — Z20828 Contact with and (suspected) exposure to other viral communicable diseases: Secondary | ICD-10-CM | POA: Diagnosis not present

## 2019-03-11 DIAGNOSIS — Z20828 Contact with and (suspected) exposure to other viral communicable diseases: Secondary | ICD-10-CM | POA: Diagnosis not present

## 2019-03-19 ENCOUNTER — Other Ambulatory Visit: Payer: Self-pay | Admitting: Internal Medicine

## 2019-04-03 ENCOUNTER — Telehealth: Payer: Self-pay

## 2019-04-03 NOTE — Telephone Encounter (Signed)
PA for Saxenda has been submitted on covermymeds.  

## 2019-04-16 ENCOUNTER — Encounter: Payer: Self-pay | Admitting: Internal Medicine

## 2019-04-17 ENCOUNTER — Other Ambulatory Visit: Payer: Self-pay | Admitting: Internal Medicine

## 2019-04-17 DIAGNOSIS — F411 Generalized anxiety disorder: Secondary | ICD-10-CM

## 2019-04-17 MED ORDER — ALPRAZOLAM 1 MG PO TABS: 1.5000 mg | ORAL_TABLET | Freq: Every evening | ORAL | 1 refills | Status: DC | PRN

## 2019-04-17 MED ORDER — CLONAZEPAM 1 MG PO TABS: 1.0000 mg | ORAL_TABLET | Freq: Every day | ORAL | 1 refills | Status: DC

## 2019-04-17 NOTE — Assessment & Plan Note (Signed)
Clonazepam dose has been reduced to 1 mg daily  And refill sent for Feb 22  Alprazolam refilled for prn use

## 2019-04-25 NOTE — Telephone Encounter (Signed)
Medication has been approved through 03/30/2020.

## 2019-05-22 ENCOUNTER — Ambulatory Visit (INDEPENDENT_AMBULATORY_CARE_PROVIDER_SITE_OTHER): Payer: 59

## 2019-05-22 ENCOUNTER — Encounter: Payer: Self-pay | Admitting: Internal Medicine

## 2019-05-22 ENCOUNTER — Other Ambulatory Visit: Payer: Self-pay

## 2019-05-22 ENCOUNTER — Ambulatory Visit (INDEPENDENT_AMBULATORY_CARE_PROVIDER_SITE_OTHER): Payer: 59 | Admitting: Internal Medicine

## 2019-05-22 DIAGNOSIS — Z8669 Personal history of other diseases of the nervous system and sense organs: Secondary | ICD-10-CM

## 2019-05-22 DIAGNOSIS — Z0184 Encounter for antibody response examination: Secondary | ICD-10-CM

## 2019-05-22 DIAGNOSIS — Z6841 Body Mass Index (BMI) 40.0 and over, adult: Secondary | ICD-10-CM

## 2019-05-22 DIAGNOSIS — F411 Generalized anxiety disorder: Secondary | ICD-10-CM | POA: Diagnosis not present

## 2019-05-22 DIAGNOSIS — Z0001 Encounter for general adult medical examination with abnormal findings: Secondary | ICD-10-CM

## 2019-05-22 DIAGNOSIS — E559 Vitamin D deficiency, unspecified: Secondary | ICD-10-CM

## 2019-05-22 DIAGNOSIS — Z20822 Contact with and (suspected) exposure to covid-19: Secondary | ICD-10-CM | POA: Diagnosis not present

## 2019-05-22 DIAGNOSIS — M545 Low back pain, unspecified: Secondary | ICD-10-CM

## 2019-05-22 DIAGNOSIS — R5383 Other fatigue: Secondary | ICD-10-CM

## 2019-05-22 DIAGNOSIS — E782 Mixed hyperlipidemia: Secondary | ICD-10-CM | POA: Diagnosis not present

## 2019-05-22 DIAGNOSIS — E538 Deficiency of other specified B group vitamins: Secondary | ICD-10-CM | POA: Insufficient documentation

## 2019-05-22 DIAGNOSIS — M4326 Fusion of spine, lumbar region: Secondary | ICD-10-CM | POA: Diagnosis not present

## 2019-05-22 DIAGNOSIS — Z981 Arthrodesis status: Secondary | ICD-10-CM

## 2019-05-22 DIAGNOSIS — D500 Iron deficiency anemia secondary to blood loss (chronic): Secondary | ICD-10-CM | POA: Diagnosis not present

## 2019-05-22 DIAGNOSIS — Z0000 Encounter for general adult medical examination without abnormal findings: Secondary | ICD-10-CM

## 2019-05-22 DIAGNOSIS — R7301 Impaired fasting glucose: Secondary | ICD-10-CM

## 2019-05-22 LAB — CBC WITH DIFFERENTIAL/PLATELET
Basophils Absolute: 0 10*3/uL (ref 0.0–0.1)
Basophils Relative: 0.7 % (ref 0.0–3.0)
Eosinophils Absolute: 0 10*3/uL (ref 0.0–0.7)
Eosinophils Relative: 0.5 % (ref 0.0–5.0)
HCT: 40.1 % (ref 36.0–46.0)
Hemoglobin: 13.7 g/dL (ref 12.0–15.0)
Lymphocytes Relative: 29.2 % (ref 12.0–46.0)
Lymphs Abs: 1.8 10*3/uL (ref 0.7–4.0)
MCHC: 34.1 g/dL (ref 30.0–36.0)
MCV: 90.5 fl (ref 78.0–100.0)
Monocytes Absolute: 0.3 10*3/uL (ref 0.1–1.0)
Monocytes Relative: 5.6 % (ref 3.0–12.0)
Neutro Abs: 3.8 10*3/uL (ref 1.4–7.7)
Neutrophils Relative %: 64 % (ref 43.0–77.0)
Platelets: 254 10*3/uL (ref 150.0–400.0)
RBC: 4.43 Mil/uL (ref 3.87–5.11)
RDW: 12.9 % (ref 11.5–15.5)
WBC: 6 10*3/uL (ref 4.0–10.5)

## 2019-05-22 LAB — COMPREHENSIVE METABOLIC PANEL
ALT: 18 U/L (ref 0–35)
AST: 14 U/L (ref 0–37)
Albumin: 3.8 g/dL (ref 3.5–5.2)
Alkaline Phosphatase: 47 U/L (ref 39–117)
BUN: 10 mg/dL (ref 6–23)
CO2: 28 mEq/L (ref 19–32)
Calcium: 9.3 mg/dL (ref 8.4–10.5)
Chloride: 106 mEq/L (ref 96–112)
Creatinine, Ser: 0.68 mg/dL (ref 0.40–1.20)
GFR: 95.69 mL/min (ref >60.00)
Glucose, Bld: 86 mg/dL (ref 70–99)
Potassium: 4.1 mEq/L (ref 3.5–5.1)
Sodium: 140 mEq/L (ref 135–145)
Total Bilirubin: 0.4 mg/dL (ref 0.2–1.2)
Total Protein: 6.5 g/dL (ref 6.0–8.3)

## 2019-05-22 LAB — IBC + FERRITIN
Ferritin: 48.9 ng/mL (ref 10.0–291.0)
Iron: 130 ug/dL (ref 42–145)
Saturation Ratios: 31.7 % (ref 20.0–50.0)
Transferrin: 293 mg/dL (ref 212.0–360.0)

## 2019-05-22 LAB — VITAMIN D 25 HYDROXY (VIT D DEFICIENCY, FRACTURES): VITD: 64.45 ng/mL (ref 30.00–100.00)

## 2019-05-22 LAB — LIPID PANEL
Cholesterol: 201 mg/dL — ABNORMAL HIGH (ref 0–200)
HDL: 58.3 mg/dL (ref >39.00)
LDL Cholesterol: 105 mg/dL — ABNORMAL HIGH (ref 0–99)
NonHDL: 142.38
Total CHOL/HDL Ratio: 3
Triglycerides: 185 mg/dL — ABNORMAL HIGH (ref 0.0–149.0)
VLDL: 37 mg/dL (ref 0.0–40.0)

## 2019-05-22 LAB — HEMOGLOBIN A1C: Hgb A1c MFr Bld: 4.9 % (ref 4.6–6.5)

## 2019-05-22 MED ORDER — PROPRANOLOL HCL ER 120 MG PO CP24: 120.0000 mg | ORAL_CAPSULE | Freq: Every day | ORAL | 0 refills | Status: DC

## 2019-05-22 MED ORDER — PROPRANOLOL HCL 10 MG PO TABS: 10.0000 mg | ORAL_TABLET | Freq: Three times a day (TID) | ORAL | 0 refills | Status: DC

## 2019-05-22 NOTE — Progress Notes (Signed)
Patient ID: Tammy Christian, female    DOB: 02-26-1981  Age: 39 y.o. MRN: [id number]  The patient is here for annual examination and management of other chronic and acute problems.  This visit occurred during the SARS-CoV-2 public health emergency.  Safety protocols were in place, including screening questions prior to the visit, additional usage of staff PPE, and extensive cleaning of exam room while observing appropriate contact time as indicated for disinfecting solutions.     The risk factors are reflected in the social history.  The roster of all physicians providing medical care to patient - is listed in the Snapshot section of the chart.  Activities of daily living:  The patient is 100% independent in all ADLs: dressing, toileting, feeding as well as independent mobility  Home safety : The patient has smoke detectors in the home. They wear seatbelts.  There are no firearms at home. There is no violence in the home.   There is no risks for hepatitis, STDs or HIV. There is no   history of blood transfusion. They have no travel history to infectious disease endemic areas of the world.  The patient has seen their dentist in the last six month. They have seen their eye doctor in the last year. She denies  hearing difficulty with regard to whispered voices and some television programs.  She does  not  have excessive sun exposure. Discussed the need for sun protection: hats, long sleeves and use of sunscreen if there is significant sun exposure.   Diet: the importance of a healthy diet is discussed. They do have a healthy diet.  The benefits of regular aerobic exercise were discussed. She walks 4 times per week ,  20 minutes.   Depression screen: there are no signs or vegative symptoms of depression- irritability, change in appetite, anhedonia, sadness/tearfullness. The following portions of the patient's history were reviewed and updated as appropriate: allergies, current medications,  past family history, past medical history,  past surgical history, past social history  and problem list.  Visual acuity was not assessed per patient preference since she has regular follow up with her ophthalmologist. Hearing and body mass index were assessed and reviewed.  She  has been losing weight intentionally    During the course of the visit the patient was educated and counseled about appropriate screening and preventive services including : fall prevention , diabetes screening, nutrition counseling, colorectal cancer screening, and recommended immunizations.    CC: The primary encounter diagnosis was S/P lumbar fusion. Diagnoses of Iron deficiency anemia due to chronic blood loss, Vitamin D deficiency, Moderate mixed hyperlipidemia not requiring statin therapy, Fatigue, unspecified type, Impaired fasting blood sugar, Suspected COVID-19 virus infection, Class 3 severe obesity due to excess calories without serious comorbidity with body mass index (BMI) of 40.0 to 44.9 in adult [facility]), Low back pain potentially associated with radiculopathy, B12 deficiency, History of migraine, Encounter for preventive health examination, and Generalized anxiety disorder were also pertinent to this visit.  1) GAD: Has weaned herself off of clonazepam over a 4 month period,  after 20 years of use.  Using alprazolam prn. Last dose of clonazepam was 5 days ago .  2) H/o  Migraine :Still having migraines averaging  2-3 per month despite use of toprol 100 mg ..  Requesting trial of inderal   3) Chronic Back pain:  Symptoms improved since surgery but has episodes without radiculopathy aggravated by spending 5 hours in OR stooping over table. Using motrin,  But if moderate uses toradol and adds tramadol if severe.  Wants to check lumbar film to see if rod has migrated (history of L5-S1 fusion in 2019)   )Obesity: using Saxenda intermittently,  Suspends it when she plateaus. Has lost 45 lbs since  2019  History Tammy Millin has a past medical history of Abnormal Pap smear of cervix (2012), Abortion (2001), Allergy, Anxiety, Arthritis, Asthma, Gastritis (2005), Hyperlipidemia, Kidney stone, Migraines, Obesity, Panic disorder, and Vitamin D deficiency disease.   She has a past surgical history that includes Cholecystectomy (04/08/2012); laprascopic sleeve (04/08/2012); Knee arthroscopy (Left, 05/01/2016); Breast biopsy (Right, 02/04/2001); Colposcopy (2012); Spinal fusion (09/25/2016); Esophagogastroduodenoscopy (egd) with propofol (N/A, 07/12/2017); and Colonoscopy with propofol (N/A, 07/12/2017).   Her family history includes Alzheimer's disease in her paternal grandmother; Cancer in her maternal grandmother and paternal grandfather; Cancer (age of onset: 51) in her father; Colon polyps in her father; Diabetes in her father; Heart disease in her father and paternal grandmother; Hyperlipidemia in her father; Hypertension in her father; Macular degeneration in her maternal grandmother; Stroke in her paternal grandmother.She reports that she has never smoked. She has never used smokeless tobacco. She reports current alcohol use of about 2.0 standard drinks of alcohol per week. She reports that she does not use drugs.  Outpatient Medications Prior to Visit  Medication Sig Dispense Refill  . albuterol (VENTOLIN HFA) 108 (90 Base) MCG/ACT inhaler Inhale 2 puffs into the lungs every 4 (four) hours as needed for wheezing or shortness of breath. 1 Inhaler 5  . ALPRAZolam (XANAX) 1 MG tablet Take 1.5 tablets (1.5 mg total) by mouth at bedtime as needed for sleep. 45 tablet 1  . azelastine (ASTELIN) 0.1 % nasal spray Place 2 sprays into both nostrils 2 (two) times daily. Use in each nostril as directed 30 mL 12  . bimatoprost (LATISSE) 0.03 % ophthalmic solution APPLY 1 DROP TO UPPER EYE LID AT BEDTIME AS DIRECTED 5 mL 6  . Clindamycin-Benzoyl Per, Refr, gel APPLY TO THE AFFECTED AREA(S) daily 45 g 11  .  cyanocobalamin (,VITAMIN B-12,) 1000 MCG/ML injection Inject 1 mL (1,000 mcg total) into the muscle every 30 (thirty) days. 3 mL 3  . desloratadine (CLARINEX) 5 MG tablet TAKE 1 TABLET BY MOUTH DAILY. 60 tablet 1  . lidocaine-prilocaine (EMLA) cream APPLY TOPICALLY AS NEEDED. 30 g 3  . Liraglutide -Weight Management (SAXENDA) 18 MG/3ML SOPN Inject 3 mg into the skin daily. 15 pen 3  . mometasone (NASONEX) 50 MCG/ACT nasal spray Place 2 sprays into the nose daily. 17 g 12  . montelukast (SINGULAIR) 10 MG tablet TAKE 1 TABLET BY MOUTH AT BEDTIME. 90 tablet 3  . SUMAtriptan (IMITREX) 100 MG tablet TAKE 1 TABLET BY MOUTH DAILY AS NEEDED. 10 tablet 3  . Syringe/Needle, Disp, (SYRINGE 3CC/25GX1") 25G X 1" 3 ML MISC Use for b12 injections 50 each 0  . tamsulosin (FLOMAX) 0.4 MG CAPS capsule Take 1 capsule (0.4 mg total) by mouth as needed (for kidney stone). 30 capsule 6  . traMADol (ULTRAM) 50 MG tablet Take 1 tablet (50 mg total) by mouth every 6 (six) hours as needed. 60 tablet 5  . Vitamin D, Ergocalciferol, (DRISDOL) 1.25 MG (50000 UT) CAPS capsule TAKE 1 CAPSULE BY MOUTH EVERY 7 DAYS ON SUNDAYS 12 capsule 3  . metoprolol succinate (TOPROL-XL) 100 MG 24 hr tablet TAKE 1 TABLET BY MOUTH DAILY. TAKE WITH OR IMMEDIATELY FOLLOWING A MEAL. 90 tablet 3  . clonazePAM (KLONOPIN) 1 MG tablet  Take 1 tablet (1 mg total) by mouth daily. 30 tablet 1  . Ketorolac Tromethamine (SPRIX) 15.75 MG/SPRAY SOLN Place 15.75 mg into the nose as needed (1 spray in both nostrils every 6-8 hours as needed for kidney stone pain ** Maxium daily dose is 4 doses for up to 5 days). 5 each 3  . Multiple Vitamins-Minerals (ADULT GUMMY PO) Take 2 tablets by mouth daily.    Marland Kitchen omeprazole (PRILOSEC) 40 MG capsule TAKE 1 CAPSULE BY MOUTH 2 TIMES DAILY BEFORE A MEAL. 180 capsule 3   No facility-administered medications prior to visit.    Review of Systems   Patient denies , fevers, malaise, unintentional weight loss, skin rash, eye  pain, sinus congestion and sinus pain, sore throat, dysphagia,  hemoptysis , cough, dyspnea, wheezing, chest pain, palpitations, orthopnea, edema, abdominal pain, nausea, melena, diarrhea, constipation, flank pain, dysuria, hematuria, urinary  Frequency, nocturia, numbness, tingling, seizures,  Focal weakness, Loss of consciousness,  Tremor, insomnia, depression,  and suicidal ideation.      Objective:  BP 120/82 (BP Location: Left Arm, Patient Position: Sitting, Cuff Size: Large)   Pulse 94   Temp 97.7 F (36.5 C) (Temporal)   Resp 15   Ht 5\' 7"  (1.702 m)   Wt 214 lb 6.4 oz (97.3 kg)   SpO2 98%   BMI 33.58 kg/m   Physical Exam   General appearance: alert, cooperative and appears stated age Ears: normal TM's and external ear canals both ears Throat: lips, mucosa, and tongue normal; teeth and gums normal Neck: no adenopathy, no carotid bruit, supple, symmetrical, trachea midline and thyroid not enlarged, symmetric, no tenderness/mass/nodules Back: symmetric, no curvature. ROM normal. No CVA tenderness. Lungs: clear to auscultation bilaterally Heart: regular rate and rhythm, S1, S2 normal, no murmur, click, rub or gallop Abdomen: soft, non-tender; bowel sounds normal; no masses,  no organomegaly Pulses: 2+ and symmetric Skin: Skin color, texture, turgor normal. No rashes or lesions Lymph nodes: Cervical, supraclavicular, and axillary nodes normal.    Assessment & Plan:   Problem List Items Addressed This Visit      Unprioritized   Generalized anxiety disorder    She has successfully self weaned from twice daily clonazepam       History of migraine    Starting propranolol ER for prevention      Obesity    I have congratulated her in reduction of   BMI . She has lost 45 lbs since 2019.        Encounter for preventive health examination    age appropriate education and counseling updated, referrals for preventative services and immunizations addressed, dietary and smoking  counseling addressed, most recent labs reviewed.  I have personally reviewed and have noted:  1) the patient's medical and social history 2) The pt's use of alcohol, tobacco, and illicit drugs 3) The patient's current medications and supplements 4) Functional ability including ADL's, fall risk, home safety risk, hearing and visual impairment 5) Diet and physical activities 6) Evidence for depression or mood disorder 7) The patient's height, weight, and BMI have been recorded in the chart  I have made referrals, and provided counseling and education based on review of the above      Vitamin D deficiency    Chronic, recurrent. Aggravated by prior bariatric surgery (sleeve)  Checking today       Relevant Orders   VITAMIN D 25 Hydroxy (Vit-D Deficiency, Fractures) (Completed)   Iron deficiency anemia    Rechecking cbc  and iron on once daily iron supplement       Relevant Orders   IBC + Ferritin (Completed)   CBC with Differential/Platelet (Completed)   Low back pain potentially associated with radiculopathy    Pain management regimen reviewed and lumbar spine film ordered .  No evidence of rod migration      B12 deficiency    Secondary to bariatric surgery  continue IM monthly injections        Other Visit Diagnoses    S/P lumbar fusion    -  Primary   Relevant Orders   DG Lumbar Spine Complete (Completed)   Moderate mixed hyperlipidemia not requiring statin therapy       Relevant Medications   propranolol ER (INDERAL LA) 120 MG 24 hr capsule   propranolol (INDERAL) 10 MG tablet   Other Relevant Orders   Lipid panel (Completed)   Fatigue, unspecified type       Impaired fasting blood sugar       Relevant Orders   Hemoglobin A1c (Completed)   Comprehensive metabolic panel (Completed)   Suspected COVID-19 virus infection       Relevant Orders   SAR CoV2 Serology (COVID 19)AB(IGG)IA (Completed)      I have discontinued Tammy Christian's Multiple  Vitamins-Minerals (ADULT GUMMY PO), Ketorolac Tromethamine, omeprazole, metoprolol succinate, and clonazePAM. I am also having her start on propranolol ER and propranolol. Additionally, I am having her maintain her lidocaine-prilocaine, bimatoprost, mometasone, azelastine, cyanocobalamin, Vitamin D (Ergocalciferol), tamsulosin, albuterol, Liraglutide -Weight Management, Clindamycin-Benzoyl Per (Refr), SYRINGE 3CC/25GX1", traMADol, montelukast, SUMAtriptan, desloratadine, and ALPRAZolam.  Meds ordered this encounter  Medications  . propranolol ER (INDERAL LA) 120 MG 24 hr capsule    Sig: Take 1 capsule (120 mg total) by mouth daily.    Dispense:  30 capsule    Refill:  0  . propranolol (INDERAL) 10 MG tablet    Sig: Take 1 tablet (10 mg total) by mouth 3 (three) times daily. As needed for rapid heart rate    Dispense:  30 tablet    Refill:  0    Medications Discontinued During This Encounter  Medication Reason  . Multiple Vitamins-Minerals (ADULT GUMMY PO) Patient has not taken in last 30 days  . Ketorolac Tromethamine (SPRIX) 15.75 MG/SPRAY SOLN Patient has not taken in last 30 days  . omeprazole (PRILOSEC) 40 MG capsule Patient has not taken in last 30 days  . clonazePAM (KLONOPIN) 1 MG tablet Patient has not taken in last 30 days  . metoprolol succinate (TOPROL-XL) 100 MG 24 hr tablet     Follow-up: No follow-ups on file.   Crecencio Mc, MD

## 2019-05-22 NOTE — Assessment & Plan Note (Signed)
I have congratulated her in reduction of   BMI . She has lost 45 lbs since 2019.

## 2019-05-22 NOTE — Assessment & Plan Note (Signed)
Secondary to bariatric surgery  continue IM monthly injections

## 2019-05-22 NOTE — Assessment & Plan Note (Signed)
Rechecking cbc and iron on once daily iron supplement

## 2019-05-22 NOTE — Assessment & Plan Note (Addendum)
Pain management regimen reviewed and lumbar spine film ordered .  No evidence of rod migration

## 2019-05-22 NOTE — Assessment & Plan Note (Signed)
Chronic, recurrent. Aggravated by prior bariatric surgery (sleeve)  Checking today

## 2019-05-23 LAB — SAR COV2 SEROLOGY (COVID19)AB(IGG),IA: SARS CoV2 AB IGG: POSITIVE — AB

## 2019-05-25 NOTE — Assessment & Plan Note (Signed)
Starting propranolol ER for prevention

## 2019-05-25 NOTE — Assessment & Plan Note (Signed)

## 2019-05-25 NOTE — Assessment & Plan Note (Signed)
She has successfully self weaned from twice daily clonazepam

## 2019-05-29 NOTE — Addendum Note (Signed)
Addended by: Adair Laundry on: 05/29/2019 04:42 PM   Modules accepted: Orders

## 2019-05-29 NOTE — Telephone Encounter (Signed)
Med list has been corrected 

## 2019-06-01 ENCOUNTER — Other Ambulatory Visit: Payer: Self-pay | Admitting: Obstetrics & Gynecology

## 2019-06-01 DIAGNOSIS — N938 Other specified abnormal uterine and vaginal bleeding: Secondary | ICD-10-CM

## 2019-06-02 ENCOUNTER — Other Ambulatory Visit: Payer: Self-pay | Admitting: Internal Medicine

## 2019-06-02 ENCOUNTER — Other Ambulatory Visit
Admission: RE | Admit: 2019-06-02 | Discharge: 2019-06-02 | Disposition: A | Discharge status: Home / Self Care | Payer: 59 | Source: Ambulatory Visit | Attending: Obstetrics & Gynecology | Admitting: Obstetrics & Gynecology

## 2019-06-02 DIAGNOSIS — N938 Other specified abnormal uterine and vaginal bleeding: Secondary | ICD-10-CM | POA: Diagnosis not present

## 2019-06-02 NOTE — Telephone Encounter (Signed)
Tramadol refilled.

## 2019-06-03 ENCOUNTER — Other Ambulatory Visit: Payer: Self-pay | Admitting: Internal Medicine

## 2019-06-03 LAB — FSH/LH
FSH: 35.4 m[IU]/mL
LH: 18.6 m[IU]/mL

## 2019-06-03 LAB — PROGESTERONE: Progesterone: 0.1 ng/mL

## 2019-06-03 MED ORDER — ADAPALENE 0.3 % EX GEL: CUTANEOUS | 1 refills | Status: DC

## 2019-06-03 MED ORDER — KETOROLAC TROMETHAMINE 10 MG PO TABS: 10.0000 mg | ORAL_TABLET | Freq: Three times a day (TID) | ORAL | 0 refills | Status: DC

## 2019-06-03 MED ORDER — DESLORATADINE 5 MG PO TABS: 5.0000 mg | ORAL_TABLET | Freq: Every day | ORAL | 3 refills | Status: DC

## 2019-06-03 NOTE — Addendum Note (Signed)
Addended by: Adair Laundry on: 06/03/2019 08:25 AM   Modules accepted: Orders

## 2019-06-03 NOTE — Telephone Encounter (Signed)
I have refilled the Clarinex. She is needing refills on the Adapalene Gel(never been filled by you), toradol, and tramadol.   Last OV: 05/22/2019 Next OV: 05/24/2020

## 2019-06-12 LAB — ESTRADIOL

## 2019-06-22 ENCOUNTER — Other Ambulatory Visit: Payer: Self-pay

## 2019-06-23 ENCOUNTER — Other Ambulatory Visit: Payer: Self-pay | Admitting: Obstetrics & Gynecology

## 2019-06-23 DIAGNOSIS — N911 Secondary amenorrhea: Secondary | ICD-10-CM

## 2019-06-23 MED ORDER — PROPRANOLOL HCL ER 120 MG PO CP24: 120.0000 mg | ORAL_CAPSULE | Freq: Every day | ORAL | 0 refills | Status: DC

## 2019-07-04 ENCOUNTER — Other Ambulatory Visit
Admission: RE | Admit: 2019-07-04 | Discharge: 2019-07-04 | Disposition: A | Discharge status: Home / Self Care | Payer: 59 | Source: Ambulatory Visit | Attending: Obstetrics & Gynecology | Admitting: Obstetrics & Gynecology

## 2019-07-04 DIAGNOSIS — N911 Secondary amenorrhea: Secondary | ICD-10-CM | POA: Insufficient documentation

## 2019-07-05 LAB — FSH/LH
FSH: 6.5 m[IU]/mL
LH: 12.4 m[IU]/mL

## 2019-07-05 LAB — PROGESTERONE: Progesterone: <0.1 ng/mL

## 2019-07-05 LAB — ESTRADIOL: Estradiol: 224 pg/mL

## 2019-07-08 LAB — ANTI MULLERIAN HORMONE: ANTI-MULLERIAN HORMONE (AMH): 0.205 ng/mL

## 2019-07-10 LAB — 17-HYDROXYPROGESTERONE: 17-OH-Progesterone, LC/MS/MS: 22 ng/dL

## 2019-07-21 ENCOUNTER — Other Ambulatory Visit: Payer: Self-pay | Admitting: Internal Medicine

## 2019-07-21 NOTE — Telephone Encounter (Signed)
Refill request, last seen 05-22-19, last filled 04-17-19.  Please advise.

## 2019-08-14 ENCOUNTER — Other Ambulatory Visit: Payer: Self-pay | Admitting: Vascular Surgery

## 2019-08-19 ENCOUNTER — Other Ambulatory Visit: Payer: Self-pay | Admitting: Vascular Surgery

## 2019-08-19 MED ORDER — OXYCODONE HCL 5 MG PO TABS: 5.0000 mg | ORAL_TABLET | Freq: Four times a day (QID) | ORAL | 0 refills | Status: AC | PRN

## 2019-08-19 MED ORDER — PROPRANOLOL HCL 10 MG PO TABS: 10.0000 mg | ORAL_TABLET | Freq: Three times a day (TID) | ORAL | 3 refills | Status: DC

## 2019-08-19 MED ORDER — TRAZODONE HCL 50 MG PO TABS: 25.0000 mg | ORAL_TABLET | Freq: Every evening | ORAL | 3 refills | Status: DC | PRN

## 2019-08-19 MED ORDER — PROPRANOLOL HCL ER 120 MG PO CP24: 120.0000 mg | ORAL_CAPSULE | Freq: Every day | ORAL | 3 refills | Status: DC

## 2019-08-25 DIAGNOSIS — Z1231 Encounter for screening mammogram for malignant neoplasm of breast: Secondary | ICD-10-CM | POA: Diagnosis not present

## 2019-08-25 DIAGNOSIS — Z01818 Encounter for other preprocedural examination: Secondary | ICD-10-CM | POA: Diagnosis not present

## 2019-08-25 DIAGNOSIS — Z1151 Encounter for screening for human papillomavirus (HPV): Secondary | ICD-10-CM | POA: Diagnosis not present

## 2019-08-25 DIAGNOSIS — Z124 Encounter for screening for malignant neoplasm of cervix: Secondary | ICD-10-CM | POA: Diagnosis not present

## 2019-08-25 DIAGNOSIS — Z30017 Encounter for initial prescription of implantable subdermal contraceptive: Secondary | ICD-10-CM | POA: Diagnosis not present

## 2019-08-25 DIAGNOSIS — Z8742 Personal history of other diseases of the female genital tract: Secondary | ICD-10-CM | POA: Diagnosis not present

## 2019-08-25 DIAGNOSIS — Z113 Encounter for screening for infections with a predominantly sexual mode of transmission: Secondary | ICD-10-CM | POA: Diagnosis not present

## 2019-08-25 DIAGNOSIS — Z01419 Encounter for gynecological examination (general) (routine) without abnormal findings: Secondary | ICD-10-CM | POA: Diagnosis not present

## 2019-09-16 ENCOUNTER — Other Ambulatory Visit: Payer: Self-pay | Admitting: Internal Medicine

## 2019-09-16 NOTE — Telephone Encounter (Signed)
Refill request for xanax, last seen 05-22-19, last filled 07-21-19.  Please advise.

## 2019-09-23 ENCOUNTER — Other Ambulatory Visit: Payer: Self-pay | Admitting: Internal Medicine

## 2019-09-23 ENCOUNTER — Other Ambulatory Visit: Payer: Self-pay | Admitting: Obstetrics & Gynecology

## 2019-09-23 DIAGNOSIS — Z1231 Encounter for screening mammogram for malignant neoplasm of breast: Secondary | ICD-10-CM

## 2019-10-13 ENCOUNTER — Ambulatory Visit
Admission: RE | Admit: 2019-10-13 | Discharge: 2019-10-13 | Disposition: A | Discharge status: Home / Self Care | Payer: 59 | Source: Ambulatory Visit | Attending: Obstetrics & Gynecology | Admitting: Obstetrics & Gynecology

## 2019-10-13 DIAGNOSIS — Z1231 Encounter for screening mammogram for malignant neoplasm of breast: Secondary | ICD-10-CM | POA: Insufficient documentation

## 2019-10-15 ENCOUNTER — Other Ambulatory Visit: Payer: Self-pay | Admitting: Internal Medicine

## 2019-10-15 NOTE — Telephone Encounter (Signed)
Refill request for, last seen 05-22-19, last filled 08-16-18. LAST LABS: 05-22-19  Please advise.

## 2019-10-29 ENCOUNTER — Other Ambulatory Visit: Payer: Self-pay | Admitting: Internal Medicine

## 2019-10-29 MED ORDER — VITAMIN D (ERGOCALCIFEROL) 1.25 MG (50000 UNIT) PO CAPS: ORAL_CAPSULE | ORAL | 3 refills | Status: DC

## 2019-10-29 MED ORDER — LIDOCAINE-PRILOCAINE 2.5-2.5 % EX CREA: TOPICAL_CREAM | CUTANEOUS | 3 refills | Status: DC

## 2019-10-31 ENCOUNTER — Other Ambulatory Visit: Payer: Self-pay | Admitting: Internal Medicine

## 2019-11-12 ENCOUNTER — Other Ambulatory Visit: Payer: Self-pay | Admitting: Internal Medicine

## 2019-11-12 MED ORDER — ALPRAZOLAM 1 MG PO TABS: ORAL_TABLET | ORAL | 0 refills | Status: DC

## 2019-11-12 MED ORDER — TRAZODONE HCL 100 MG PO TABS: 100.0000 mg | ORAL_TABLET | Freq: Every day | ORAL | 5 refills | Status: DC

## 2019-12-05 ENCOUNTER — Ambulatory Visit: Payer: 59 | Admitting: Internal Medicine

## 2019-12-09 ENCOUNTER — Other Ambulatory Visit: Payer: Self-pay

## 2019-12-09 ENCOUNTER — Telehealth (INDEPENDENT_AMBULATORY_CARE_PROVIDER_SITE_OTHER): Payer: 59 | Admitting: Internal Medicine

## 2019-12-09 ENCOUNTER — Encounter: Payer: Self-pay | Admitting: Internal Medicine

## 2019-12-09 VITALS — Ht 67.01 in | Wt 225.0 lb

## 2019-12-09 DIAGNOSIS — N938 Other specified abnormal uterine and vaginal bleeding: Secondary | ICD-10-CM | POA: Diagnosis not present

## 2019-12-09 DIAGNOSIS — N924 Excessive bleeding in the premenopausal period: Secondary | ICD-10-CM

## 2019-12-09 DIAGNOSIS — F411 Generalized anxiety disorder: Secondary | ICD-10-CM | POA: Diagnosis not present

## 2019-12-09 DIAGNOSIS — M5387 Other specified dorsopathies, lumbosacral region: Secondary | ICD-10-CM | POA: Diagnosis not present

## 2019-12-09 DIAGNOSIS — R7301 Impaired fasting glucose: Secondary | ICD-10-CM | POA: Diagnosis not present

## 2019-12-09 DIAGNOSIS — Z79899 Other long term (current) drug therapy: Secondary | ICD-10-CM

## 2019-12-09 MED ORDER — BUSPIRONE HCL 30 MG PO TABS: 30.0000 mg | ORAL_TABLET | Freq: Two times a day (BID) | ORAL | 0 refills | Status: DC

## 2019-12-09 MED ORDER — TEMAZEPAM 30 MG PO CAPS: 30.0000 mg | ORAL_CAPSULE | Freq: Every evening | ORAL | 0 refills | Status: DC | PRN

## 2019-12-09 NOTE — Assessment & Plan Note (Signed)
Repeating a trial of buspirone 30 mg bid. Temazepam trial for insomnia

## 2019-12-09 NOTE — Assessment & Plan Note (Signed)
Pain management regimen reviewed ; continue tramadol and tylenol,  And gabapentin .  Advised to reduce use of toradol which she is using at least once per  month

## 2019-12-09 NOTE — Progress Notes (Signed)
Virtual Visit via [place]  This visit type was conducted due to national recommendations for restrictions regarding the COVID-19 pandemic (e.g. social distancing).  This format is felt to be most appropriate for this patient at this time.  All issues noted in this document were discussed and addressed.  No physical exam was performed (except for noted visual exam findings with Video Visits).   I connected with@ on 12/09/19 at  4:30 PM EDT by a video enabled telemedicine application and verified that I am speaking with the correct person using two identifiers. Location patient: home Location provider: work or home office Persons participating in the virtual visit: patient, provider  I discussed the limitations, risks, security and privacy concerns of performing an evaluation and management service by telephone and the availability of in person appointments. I also discussed with the patient that there may be a patient responsible charge related to this service. The patient expressed understanding and agreed to proceed.  Reason for visit: follow up on chronic pain,  Chronic insomnia secondary to anxiety   HPI:  39 yr  Old female with long history of GAD, benzodiazepine dependent, SSRI intolerant, recently self weaned from use of clonazepam presents with recurrent insomnia. Has been using 1 to 1.5 mg alprazolam with frequent wakenings.  Requesting to resume clonazepam.  Does not take any meds during the day but once home has trouble unwinding   Chronic long back pain.  She has been  Using tramadol  2 times daily    ROS: See pertinent positives and negatives per HPI.  Past Medical History:  Diagnosis Date  . Abnormal Pap smear of cervix 2012  . Abortion 2001  . Allergy   . Anxiety    panic disorder  . Arthritis   . Asthma   . Gastritis 2005  . Hyperlipidemia   . Kidney stone    multiple  . Migraines   . Obesity   . Panic disorder   . Vitamin D deficiency disease     Past  Surgical History:  Procedure Laterality Date  . BREAST BIOPSY Right 02/04/2001   Negative c Clip  . CHOLECYSTECTOMY  04/08/2012  . COLONOSCOPY WITH PROPOFOL N/A 07/12/2017   Procedure: COLONOSCOPY WITH PROPOFOL;  Surgeon: Lin Landsman, MD;  Location: [facility] ENDOSCOPY;  Service: Gastroenterology;  Laterality: N/A;  . COLPOSCOPY  2012  . ESOPHAGOGASTRODUODENOSCOPY (EGD) WITH PROPOFOL N/A 07/12/2017   Procedure: ESOPHAGOGASTRODUODENOSCOPY (EGD) WITH PROPOFOL;  Surgeon: Lin Landsman, MD;  Location: [place];  Service: Gastroenterology;  Laterality: N/A;  . KNEE ARTHROSCOPY Left 05/01/2016   Procedure: ARTHROSCOPY KNEE LATERAL RELEASE;  Surgeon: Hessie Knows, MD;  Location: [facility] ORS;  Service: Orthopedics;  Laterality: Left;  . laprascopic sleeve  04/08/2012   GASTRIC  . SPINAL FUSION  09/25/2016    Family History  Problem Relation Age of Onset  . Cancer Maternal Grandmother        LUNG, THROAT  . Macular degeneration Maternal Grandmother   . Heart disease Paternal Grandmother   . Alzheimer's disease Paternal Grandmother   . Stroke Paternal Grandmother   . Cancer Paternal Grandfather        COLON  . Diabetes Father   . Heart disease Father        atrial fib  . Hyperlipidemia Father   . Hypertension Father   . Cancer Father 48       colon polyps  . Colon polyps Father     SOCIAL HX:  reports that she has  never smoked. She has never used smokeless tobacco. She reports current alcohol use of about 2.0 standard drinks of alcohol per week. She reports that she does not use drugs.  Current Outpatient Medications:  .  albuterol (VENTOLIN HFA) 108 (90 Base) MCG/ACT inhaler, Inhale 2 puffs into the lungs every 4 (four) hours as needed for wheezing or shortness of breath., Disp: 1 Inhaler, Rfl: 5 .  ALPRAZolam (XANAX) 1 MG tablet, TAKE 1 & 1/2 TABLETS BY MOUTH AT BEDTIME AS NEEDED FOR SLEEP., Disp: 45 tablet, Rfl: 0 .  azelastine (ASTELIN) 0.1 % nasal spray, PLACE 2 SPRAYS  INTO BOTH NOSTRILS 2 TIMES DAILY AS DIRECTED, Disp: 30 mL, Rfl: 9 .  bimatoprost (LATISSE) 0.03 % ophthalmic solution, APPLY 1 DROP TO UPPER EYE LID AT BEDTIME AS DIRECTED, Disp: 5 mL, Rfl: 6 .  Clindamycin-Benzoyl Per, Refr, gel, APPLY TO THE AFFECTED AREA(S) DAILY, Disp: 45 g, Rfl: 11 .  cyanocobalamin (,VITAMIN B-12,) 1000 MCG/ML injection, INJECT 1 ML (1,000 MCG TOTAL) INTO THE MUSCLE EVERY 30 (THIRTY) DAYS., Disp: 3 mL, Rfl: 2 .  cyclobenzaprine (FLEXERIL) 10 MG tablet, Take 10 mg by mouth 3 (three) times daily., Disp: , Rfl:  .  desloratadine (CLARINEX) 5 MG tablet, Take 1 tablet (5 mg total) by mouth daily., Disp: 90 tablet, Rfl: 3 .  gabapentin (NEURONTIN) 300 MG capsule, Take 300 mg by mouth 3 (three) times daily., Disp: , Rfl:  .  ketorolac (TORADOL) 10 MG tablet, TAKE 1 TABLET BY MOUTH 3 TIMES DAILY, Disp: 20 tablet, Rfl: 0 .  lidocaine-prilocaine (EMLA) cream, APPLY TOPICALLY AS NEEDED., Disp: 30 g, Rfl: 3 .  mometasone (NASONEX) 50 MCG/ACT nasal spray, PLACE 2 SPRAYS INTO THE NOSE DAILY., Disp: 17 g, Rfl: 9 .  montelukast (SINGULAIR) 10 MG tablet, TAKE 1 TABLET BY MOUTH AT BEDTIME., Disp: 90 tablet, Rfl: 3 .  Multiple Vitamins-Minerals (CENTRUM WOMEN PO), , Disp: , Rfl:  .  omeprazole (PRILOSEC) 40 MG capsule, , Disp: , Rfl:  .  ondansetron (ZOFRAN) 8 MG tablet, Take 8 mg by mouth 3 (three) times daily., Disp: , Rfl:  .  propranolol (INDERAL) 10 MG tablet, Take 10 mg by mouth 3 (three) times daily., Disp: , Rfl:  .  propranolol ER (INDERAL LA) 120 MG 24 hr capsule, Take 1 capsule (120 mg total) by mouth daily., Disp: 90 capsule, Rfl: 3 .  SUMAtriptan (IMITREX) 100 MG tablet, TAKE 1 TABLET BY MOUTH DAILY AS NEEDED., Disp: 10 tablet, Rfl: 3 .  Syringe/Needle, Disp, (SYRINGE 3CC/25GX1") 25G X 1" 3 ML MISC, Use for b12 injections, Disp: 50 each, Rfl: 0 .  tamsulosin (FLOMAX) 0.4 MG CAPS capsule, Take 1 capsule (0.4 mg total) by mouth as needed (for kidney stone)., Disp: 30 capsule, Rfl:  6 .  traMADol (ULTRAM) 50 MG tablet, TAKE 1 TABLET BY MOUTH EVERY 6 HOURS AS NEEDED., Disp: 60 tablet, Rfl: 5 .  UNIFINE PENTIPS 31G X 5 MM MISC, , Disp: , Rfl:  .  Vitamin D, Ergocalciferol, (DRISDOL) 1.25 MG (50000 UNIT) CAPS capsule, TAKE 1 CAPSULE BY MOUTH EVERY 7 DAYS ON SUNDAYS, Disp: 12 capsule, Rfl: 3 .  busPIRone (BUSPAR) 30 MG tablet, Take 1 tablet (30 mg total) by mouth 2 (two) times daily., Disp: 60 tablet, Rfl: 0 .  temazepam (RESTORIL) 30 MG capsule, Take 1 capsule (30 mg total) by mouth at bedtime as needed for sleep., Disp: 30 capsule, Rfl: 0  EXAM:  VITALS per patient if applicable:  GENERAL: alert,  oriented, appears well and in no acute distress  HEENT: atraumatic, conjunttiva clear, no obvious abnormalities on inspection of external nose and ears  NECK: normal movements of the head and neck  LUNGS: on inspection no signs of respiratory distress, breathing rate appears normal, no obvious gross SOB, gasping or wheezing  CV: no obvious cyanosis  MS: moves all visible extremities without noticeable abnormality  PSYCH/NEURO: pleasant and cooperative, no obvious depression or anxiety, speech and thought processing grossly intact  ASSESSMENT AND PLAN:  Discussed the following assessment and plan:  Long-term use of high-risk medication - Plan: Comprehensive metabolic panel  Menorrhagia, premenopausal - Plan: CBC with Differential/Platelet, Ferritin, Iron and TIBC, Ambulatory referral to Obstetrics / Gynecology  Impaired fasting glucose - Plan: Hemoglobin A1c  Dysfunctional uterine bleeding - Plan: Ambulatory referral to Obstetrics / Gynecology  Sciatica of left side associated with disorder of lumbosacral spine  Generalized anxiety disorder  Sciatica of left side associated with disorder of lumbosacral spine Pain management regimen reviewed ; continue tramadol and tylenol,  And gabapentin .  Advised to reduce use of toradol which she is using at least once per   month   Generalized anxiety disorder Repeating a trial of buspirone 30 mg bid. Temazepam trial for insomnia     I discussed the assessment and treatment plan with the patient. The patient was provided an opportunity to ask questions and all were answered. The patient agreed with the plan and demonstrated an understanding of the instructions.   The patient was advised to call back or seek an in-person evaluation if the symptoms worsen or if the condition fails to improve as anticipated.  I provided 30 minutes of  face-to-face time during this encounter.   Crecencio Mc, MD

## 2019-12-10 ENCOUNTER — Other Ambulatory Visit: Payer: Self-pay | Admitting: Internal Medicine

## 2019-12-11 ENCOUNTER — Other Ambulatory Visit: Payer: Self-pay | Admitting: Internal Medicine

## 2019-12-15 ENCOUNTER — Other Ambulatory Visit
Admission: RE | Admit: 2019-12-15 | Discharge: 2019-12-15 | Disposition: A | Discharge status: Home / Self Care | Payer: 59 | Attending: Internal Medicine | Admitting: Internal Medicine

## 2019-12-15 DIAGNOSIS — N924 Excessive bleeding in the premenopausal period: Secondary | ICD-10-CM | POA: Insufficient documentation

## 2019-12-15 DIAGNOSIS — Z79899 Other long term (current) drug therapy: Secondary | ICD-10-CM | POA: Insufficient documentation

## 2019-12-15 DIAGNOSIS — R7301 Impaired fasting glucose: Secondary | ICD-10-CM | POA: Diagnosis not present

## 2019-12-15 LAB — COMPREHENSIVE METABOLIC PANEL
ALT: 119 U/L — ABNORMAL HIGH (ref 0–44)
AST: 68 U/L — ABNORMAL HIGH (ref 15–41)
Albumin: 3.8 g/dL (ref 3.5–5.0)
Alkaline Phosphatase: 72 U/L (ref 38–126)
Anion gap: 11 (ref 5–15)
BUN: 8 mg/dL (ref 6–20)
CO2: 22 mmol/L (ref 22–32)
Calcium: 8.9 mg/dL (ref 8.9–10.3)
Chloride: 109 mmol/L (ref 98–111)
Creatinine, Ser: 0.64 mg/dL (ref 0.44–1.00)
GFR calc Af Amer: >60 mL/min (ref >60)
GFR calc non Af Amer: >60 mL/min (ref >60)
Glucose, Bld: 122 mg/dL — ABNORMAL HIGH (ref 70–99)
Potassium: 3.8 mmol/L (ref 3.5–5.1)
Sodium: 142 mmol/L (ref 135–145)
Total Bilirubin: 0.6 mg/dL (ref 0.3–1.2)
Total Protein: 6.5 g/dL (ref 6.5–8.1)

## 2019-12-15 LAB — CBC WITH DIFFERENTIAL/PLATELET
Abs Immature Granulocytes: 0.03 10*3/uL (ref 0.00–0.07)
Basophils Absolute: 0.1 10*3/uL (ref 0.0–0.1)
Basophils Relative: 1 %
Eosinophils Absolute: 0.2 10*3/uL (ref 0.0–0.5)
Eosinophils Relative: 2 %
HCT: 39.2 % (ref 36.0–46.0)
Hemoglobin: 13 g/dL (ref 12.0–15.0)
Immature Granulocytes: 0 %
Lymphocytes Relative: 25 %
Lymphs Abs: 1.8 10*3/uL (ref 0.7–4.0)
MCH: 30.7 pg (ref 26.0–34.0)
MCHC: 33.2 g/dL (ref 30.0–36.0)
MCV: 92.7 fL (ref 80.0–100.0)
Monocytes Absolute: 0.5 10*3/uL (ref 0.1–1.0)
Monocytes Relative: 8 %
Neutro Abs: 4.6 10*3/uL (ref 1.7–7.7)
Neutrophils Relative %: 64 %
Platelets: 263 10*3/uL (ref 150–400)
RBC: 4.23 MIL/uL (ref 3.87–5.11)
RDW: 13.8 % (ref 11.5–15.5)
WBC: 7.2 10*3/uL (ref 4.0–10.5)
nRBC: 0 % (ref 0.0–0.2)

## 2019-12-15 LAB — HEMOGLOBIN A1C
Hgb A1c MFr Bld: 5.1 % (ref 4.8–5.6)
Mean Plasma Glucose: 99.67 mg/dL

## 2019-12-15 LAB — IRON AND TIBC
Iron: 92 ug/dL (ref 28–170)
Saturation Ratios: 24 % (ref 10.4–31.8)
TIBC: 379 ug/dL (ref 250–450)
UIBC: 287 ug/dL

## 2019-12-15 LAB — FERRITIN: Ferritin: 27 ng/mL (ref 11–307)

## 2019-12-16 DIAGNOSIS — R1013 Epigastric pain: Secondary | ICD-10-CM

## 2019-12-22 ENCOUNTER — Telehealth: Payer: Self-pay | Admitting: Internal Medicine

## 2019-12-22 ENCOUNTER — Other Ambulatory Visit: Payer: Self-pay | Admitting: Internal Medicine

## 2019-12-22 NOTE — Telephone Encounter (Signed)
Refill request for xanax, last seen 12-09-19, last filled 11-12-19.  Please advise.

## 2019-12-22 NOTE — Telephone Encounter (Signed)
Rejection Reason - Patient did not respond - Made 3 or more attempts to contact pt" [facility] said on Dec 22, 2019 10:27 AM

## 2019-12-30 ENCOUNTER — Other Ambulatory Visit: Payer: Self-pay | Admitting: Internal Medicine

## 2019-12-30 DIAGNOSIS — R748 Abnormal levels of other serum enzymes: Secondary | ICD-10-CM

## 2019-12-30 MED ORDER — ALPRAZOLAM 1 MG PO TABS
ORAL_TABLET | ORAL | 0 refills | Status: DC
Start: 2020-01-08 — End: 2020-01-04

## 2019-12-30 MED ORDER — BUSPIRONE HCL 30 MG PO TABS
30.0000 mg | ORAL_TABLET | Freq: Three times a day (TID) | ORAL | 1 refills | Status: DC
Start: 2019-12-30 — End: 2020-05-24

## 2020-01-04 ENCOUNTER — Other Ambulatory Visit: Payer: Self-pay | Admitting: Internal Medicine

## 2020-01-04 MED ORDER — ALPRAZOLAM 1 MG PO TABS: 1.5000 mg | ORAL_TABLET | Freq: Every evening | ORAL | 2 refills | Status: DC | PRN

## 2020-01-04 NOTE — Addendum Note (Signed)
Addended by: Crecencio Mc on: 01/04/2020 10:11 PM   Modules accepted: Orders

## 2020-01-14 ENCOUNTER — Other Ambulatory Visit: Payer: Self-pay | Admitting: Internal Medicine

## 2020-01-14 DIAGNOSIS — N912 Amenorrhea, unspecified: Secondary | ICD-10-CM

## 2020-01-14 DIAGNOSIS — R232 Flushing: Secondary | ICD-10-CM | POA: Diagnosis not present

## 2020-01-14 DIAGNOSIS — N938 Other specified abnormal uterine and vaginal bleeding: Secondary | ICD-10-CM | POA: Diagnosis not present

## 2020-01-27 ENCOUNTER — Other Ambulatory Visit
Admission: RE | Admit: 2020-01-27 | Discharge: 2020-01-27 | Disposition: A | Discharge status: Home / Self Care | Payer: 59 | Attending: Internal Medicine | Admitting: Internal Medicine

## 2020-01-27 DIAGNOSIS — N912 Amenorrhea, unspecified: Secondary | ICD-10-CM | POA: Insufficient documentation

## 2020-01-27 DIAGNOSIS — R748 Abnormal levels of other serum enzymes: Secondary | ICD-10-CM | POA: Insufficient documentation

## 2020-01-27 LAB — HEPATIC FUNCTION PANEL
ALT: 33 U/L (ref 0–44)
AST: 25 U/L (ref 15–41)
Albumin: 4.2 g/dL (ref 3.5–5.0)
Alkaline Phosphatase: 77 U/L (ref 38–126)
Bilirubin, Direct: <0.1 mg/dL (ref 0.0–0.2)
Total Bilirubin: 0.8 mg/dL (ref 0.3–1.2)
Total Protein: 7.5 g/dL (ref 6.5–8.1)

## 2020-01-28 LAB — FOLLICLE STIMULATING HORMONE: FSH: 9.9 m[IU]/mL

## 2020-01-28 LAB — LUTEINIZING HORMONE: LH: 23.9 m[IU]/mL

## 2020-01-28 LAB — ESTRADIOL: Estradiol: 309 pg/mL

## 2020-01-28 NOTE — Progress Notes (Signed)
The labs Dr Garwin Brothers requested are resulted;  I have let her know that they are available for review  Regards,   Deborra Medina, MD

## 2020-02-02 ENCOUNTER — Other Ambulatory Visit: Payer: Self-pay | Admitting: Internal Medicine

## 2020-02-04 DIAGNOSIS — Z20828 Contact with and (suspected) exposure to other viral communicable diseases: Secondary | ICD-10-CM | POA: Diagnosis not present

## 2020-02-05 ENCOUNTER — Other Ambulatory Visit: Payer: Self-pay | Admitting: Internal Medicine

## 2020-02-05 DIAGNOSIS — Z6841 Body Mass Index (BMI) 40.0 and over, adult: Secondary | ICD-10-CM

## 2020-02-05 MED ORDER — SEMAGLUTIDE-WEIGHT MANAGEMENT 0.25 MG/0.5ML ~~LOC~~ SOAJ: 0.2500 mg | SUBCUTANEOUS | 0 refills | Status: DC

## 2020-02-05 NOTE — Assessment & Plan Note (Signed)
Patient is requesting 103 .  It is not on formulary.  rx for Month 1 sent to pharmacy The schedule of titration is as follows:  Week 1 through week 4 : 0.25 mg once weekly. Week 5 through week 8: 0.5 mg once weekly. Week 9 through week 12: 1 mg once weekly. Week 13 through week 16: 1.7 mg once weekly. Week 17 and thereafter (maintenance dosage): 2.4 mg once weekly; if not tolerated, may temporarily decrease dosage to 1.7 mg once weekly for up to 4 additional weeks, then increase to 2.4 mg once weekly.

## 2020-03-30 ENCOUNTER — Telehealth: Payer: Self-pay

## 2020-03-30 NOTE — Telephone Encounter (Signed)
LMTCB. Need to let pt know that Tammy Christian has been approved through her insurance.   Approved through 09/25/2020.  Also sent mychart message.

## 2020-04-04 ENCOUNTER — Other Ambulatory Visit: Payer: Self-pay | Admitting: Internal Medicine

## 2020-04-04 MED ORDER — TRAMADOL HCL 50 MG PO TABS
50.0000 mg | ORAL_TABLET | Freq: Four times a day (QID) | ORAL | 5 refills | Status: DC | PRN
Start: 2020-04-04 — End: 2020-04-04

## 2020-04-04 MED ORDER — ALPRAZOLAM 1 MG PO TABS: 1.5000 mg | ORAL_TABLET | Freq: Every evening | ORAL | 2 refills | Status: DC | PRN

## 2020-04-04 MED ORDER — GABAPENTIN 300 MG PO CAPS
300.0000 mg | ORAL_CAPSULE | Freq: Three times a day (TID) | ORAL | 1 refills | Status: DC
Start: 2020-04-04 — End: 2020-04-04

## 2020-04-04 NOTE — Addendum Note (Signed)
Addended by: Crecencio Mc on: 04/04/2020 09:11 PM   Modules accepted: Orders

## 2020-04-28 ENCOUNTER — Other Ambulatory Visit: Payer: Self-pay | Admitting: Vascular Surgery

## 2020-05-12 ENCOUNTER — Other Ambulatory Visit: Payer: Self-pay | Admitting: Internal Medicine

## 2020-05-12 DIAGNOSIS — Z6841 Body Mass Index (BMI) 40.0 and over, adult: Secondary | ICD-10-CM

## 2020-05-12 MED ORDER — SEMAGLUTIDE-WEIGHT MANAGEMENT 0.5 MG/0.5ML ~~LOC~~ SOAJ: 0.5000 mg | SUBCUTANEOUS | 0 refills | Status: DC

## 2020-05-12 MED ORDER — SEMAGLUTIDE-WEIGHT MANAGEMENT 1 MG/0.5ML ~~LOC~~ SOAJ: 1.0000 mg | SUBCUTANEOUS | 0 refills | Status: DC

## 2020-05-13 ENCOUNTER — Other Ambulatory Visit: Payer: Self-pay

## 2020-05-24 ENCOUNTER — Encounter: Payer: Self-pay | Admitting: Internal Medicine

## 2020-05-24 ENCOUNTER — Other Ambulatory Visit: Payer: Self-pay | Admitting: Internal Medicine

## 2020-05-24 ENCOUNTER — Other Ambulatory Visit: Payer: Self-pay

## 2020-05-24 ENCOUNTER — Ambulatory Visit (INDEPENDENT_AMBULATORY_CARE_PROVIDER_SITE_OTHER): Payer: 59

## 2020-05-24 ENCOUNTER — Ambulatory Visit (INDEPENDENT_AMBULATORY_CARE_PROVIDER_SITE_OTHER): Payer: 59 | Admitting: Internal Medicine

## 2020-05-24 VITALS — BP 118/66 | HR 83 | Temp 98.1°F | Resp 14 | Ht 67.0 in | Wt 250.2 lb

## 2020-05-24 DIAGNOSIS — M545 Low back pain, unspecified: Secondary | ICD-10-CM

## 2020-05-24 DIAGNOSIS — N83202 Unspecified ovarian cyst, left side: Secondary | ICD-10-CM | POA: Diagnosis not present

## 2020-05-24 DIAGNOSIS — G8929 Other chronic pain: Secondary | ICD-10-CM | POA: Diagnosis not present

## 2020-05-24 DIAGNOSIS — E559 Vitamin D deficiency, unspecified: Secondary | ICD-10-CM | POA: Diagnosis not present

## 2020-05-24 DIAGNOSIS — R197 Diarrhea, unspecified: Secondary | ICD-10-CM | POA: Diagnosis not present

## 2020-05-24 DIAGNOSIS — Z0001 Encounter for general adult medical examination with abnormal findings: Secondary | ICD-10-CM

## 2020-05-24 DIAGNOSIS — Z0000 Encounter for general adult medical examination without abnormal findings: Secondary | ICD-10-CM

## 2020-05-24 DIAGNOSIS — Z6841 Body Mass Index (BMI) 40.0 and over, adult: Secondary | ICD-10-CM

## 2020-05-24 DIAGNOSIS — D500 Iron deficiency anemia secondary to blood loss (chronic): Secondary | ICD-10-CM | POA: Diagnosis not present

## 2020-05-24 DIAGNOSIS — N921 Excessive and frequent menstruation with irregular cycle: Secondary | ICD-10-CM

## 2020-05-24 LAB — CBC WITH DIFFERENTIAL/PLATELET
Basophils Absolute: 0 10*3/uL (ref 0.0–0.1)
Basophils Relative: 0.5 % (ref 0.0–3.0)
Eosinophils Absolute: 0.1 10*3/uL (ref 0.0–0.7)
Eosinophils Relative: 2 % (ref 0.0–5.0)
HCT: 40.4 % (ref 36.0–46.0)
Hemoglobin: 13.8 g/dL (ref 12.0–15.0)
Lymphocytes Relative: 25.4 % (ref 12.0–46.0)
Lymphs Abs: 1.5 10*3/uL (ref 0.7–4.0)
MCHC: 34.1 g/dL (ref 30.0–36.0)
MCV: 86.3 fl (ref 78.0–100.0)
Monocytes Absolute: 0.5 10*3/uL (ref 0.1–1.0)
Monocytes Relative: 8.3 % (ref 3.0–12.0)
Neutro Abs: 3.8 10*3/uL (ref 1.4–7.7)
Neutrophils Relative %: 63.8 % (ref 43.0–77.0)
Platelets: 262 10*3/uL (ref 150.0–400.0)
RBC: 4.68 Mil/uL (ref 3.87–5.11)
RDW: 14.4 % (ref 11.5–15.5)
WBC: 5.9 10*3/uL (ref 4.0–10.5)

## 2020-05-24 LAB — LIPID PANEL
Cholesterol: 202 mg/dL — ABNORMAL HIGH (ref 0–200)
HDL: 45.2 mg/dL (ref >39.00)
LDL Cholesterol: 121 mg/dL — ABNORMAL HIGH (ref 0–99)
NonHDL: 157.12
Total CHOL/HDL Ratio: 4
Triglycerides: 179 mg/dL — ABNORMAL HIGH (ref 0.0–149.0)
VLDL: 35.8 mg/dL (ref 0.0–40.0)

## 2020-05-24 LAB — COMPREHENSIVE METABOLIC PANEL
ALT: 12 U/L (ref 0–35)
AST: 15 U/L (ref 0–37)
Albumin: 3.8 g/dL (ref 3.5–5.2)
Alkaline Phosphatase: 71 U/L (ref 39–117)
BUN: 7 mg/dL (ref 6–23)
CO2: 29 mEq/L (ref 19–32)
Calcium: 8.8 mg/dL (ref 8.4–10.5)
Chloride: 106 mEq/L (ref 96–112)
Creatinine, Ser: 0.57 mg/dL (ref 0.40–1.20)
GFR: 112.97 mL/min (ref >60.00)
Glucose, Bld: 91 mg/dL (ref 70–99)
Potassium: 3.5 mEq/L (ref 3.5–5.1)
Sodium: 143 mEq/L (ref 135–145)
Total Bilirubin: 0.3 mg/dL (ref 0.2–1.2)
Total Protein: 6.2 g/dL (ref 6.0–8.3)

## 2020-05-24 LAB — MAGNESIUM: Magnesium: 1.9 mg/dL (ref 1.5–2.5)

## 2020-05-24 LAB — SEDIMENTATION RATE: Sed Rate: 9 mm/hr (ref 0–20)

## 2020-05-24 LAB — HEMOGLOBIN A1C: Hgb A1c MFr Bld: 5.2 % (ref 4.6–6.5)

## 2020-05-24 LAB — TSH: TSH: 1.53 u[IU]/mL (ref 0.35–4.50)

## 2020-05-24 LAB — VITAMIN D 25 HYDROXY (VIT D DEFICIENCY, FRACTURES): VITD: 34.66 ng/mL (ref 30.00–100.00)

## 2020-05-24 MED ORDER — CYCLOBENZAPRINE HCL 10 MG PO TABS
10.0000 mg | ORAL_TABLET | Freq: Three times a day (TID) | ORAL | 1 refills | Status: DC
Start: 2020-05-24 — End: 2020-05-24

## 2020-05-24 NOTE — Assessment & Plan Note (Signed)
Noted on 2019 [place] ordered by DR Ward, No workup done by GYN yet , referring to Dr cousins for definitive evaluation

## 2020-05-24 NOTE — Assessment & Plan Note (Signed)
Infectious etiologies suspected.  GI pathogen panel ordered and work note writtten.  [place] X 2

## 2020-05-24 NOTE — Progress Notes (Signed)
Patient ID: Tammy Christian, female    DOB: 1980/11/26  Age: 40 y.o. MRN: [id number]  The patient is here for annual PREVENTIVE  examination and management of other chronic and acute problems.  This visit occurred during the SARS-CoV-2 public health emergency.  Safety protocols were in place, including screening questions prior to the visit, additional usage of staff PPE, and extensive cleaning of exam room while observing appropriate contact time as indicated for disinfecting solutions.   mammogram July 20211 GYN evaluation by [facility] In  October . Workup incomplete.      The risk factors are reflected in the social history.  The roster of all physicians providing medical care to patient - is listed in the Snapshot section of the chart.  Activities of daily living:  The patient is 100% independent in all ADLs: dressing, toileting, feeding as well as independent mobility  Home safety : The patient has smoke detectors in the home. They wear seatbelts.  There are no firearms at home. There is no violence in the home.   There is no risks for hepatitis, STDs or HIV. There is no   history of blood transfusion. They have no travel history to infectious disease endemic areas of the world.  The patient has seen their dentist in the last six month. They have seen their eye doctor in the last year. She denies  hearing difficulty with regard to whispered voices and some television programs.  They have deferred audiologic testing in the last year.  They do not  have excessive sun exposure. Discussed the need for sun protection: hats, long sleeves and use of sunscreen if there is significant sun exposure.   Diet: the importance of a healthy diet is discussed. They do have a healthy diet.  The benefits of regular aerobic exercise were discussed. She is not exercising regularly .  Depression screen: there are no signs or vegative symptoms of  Untreated depression- irritability, change in appetite,  anhedonia, sadness/tearfullness.  The following portions of the patient's history were reviewed and updated as appropriate: allergies, current medications, past family history, past medical history,  past surgical history, past social history  and problem list.  Visual acuity was not assessed per patient preference since she has regular follow up with her ophthalmologist. Hearing and body mass index were assessed and reviewed.   During the course of the visit the patient was educated and counseled about appropriate screening and preventive services including : fall prevention , diabetes screening, nutrition counseling, colorectal cancer screening, and recommended immunizations.    CC: The primary encounter diagnosis was Chronic midline low back pain without sciatica. Diagnoses of Diarrhea of presumed infectious origin, Vitamin D deficiency, Class 3 severe obesity due to excess calories without serious comorbidity with body mass index (BMI) of 40.0 to 44.9 in adult [facility]), Iron deficiency anemia due to chronic blood loss, Encounter for preventive health examination, Ovarian cyst, left, Menometrorrhagia, and Diarrhea in adult patient were also pertinent to this visit.   Menometrorrhagia:    LONG HISTORY.  History of use of OCPS since age 23, .followed by implant.  Spotting became bleeding  Replaced implant , then switched to  hormonal IUDs , back to OCPs .  The   Switched  from [facility] to [employer] , has been off of OCPS for the last several months  Having periods lately every 2 weeks .   Complex ovarian cyst  Noted on March  2019 on left ovary  Done to  confirm IUD placement.  .  Diarrhea and nausea since Friday.  COVID NEGATIVE X 3 HOME TESTS  .   Improving  But feels slightly dehydrated started with 7-8 watery loose stools with cramping and fecal urgency  .  No nausea or fevers. Slowing down but had 5 yesterday .  None today , but has not eaten today.  Drinking more fluids,  Eating soups,   No raw veggies   Obesity: weight gain  of 36 lbs since march 2021 and 42 lbs since 3/20.  Started [place] one month ago,  tolerating starting dose without nausea or any improvement in appetite .  Not exercising.   Chronic activity limiting back pain x 4 years   Buspirone not tolerated due to nausea and blurred vision .  Similar reaction in her 20's.  Has self weaned from clonazepam Using 1 mg alprazolam 1-2 times daily (45/month).  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    History Tammy Millin has a past medical history of Abnormal Pap smear of cervix (2012), Abortion (2001), Allergy, Anxiety, Arthritis, Asthma, Gastritis (2005), Hyperlipidemia, Kidney stone, Migraines, Obesity, Panic disorder, and Vitamin D deficiency disease.   She has a past surgical history that includes Cholecystectomy (04/08/2012); laprascopic sleeve (04/08/2012); Knee arthroscopy (Left, 05/01/2016); Colposcopy (2012); Spinal fusion (09/25/2016); Esophagogastroduodenoscopy (egd) with propofol (N/A, 07/12/2017); Colonoscopy with propofol (N/A, 07/12/2017); and Breast biopsy (Right, 02/04/2001).   Her family history includes Alzheimer's disease in her paternal grandmother; Cancer in her maternal grandmother and paternal grandfather; Cancer (age of onset: 2) in her father; Colon polyps in her father; Diabetes in her father; Heart disease in her father and paternal grandmother; Hyperlipidemia in her father; Hypertension in her father; Macular degeneration in her maternal grandmother; Stroke in her paternal grandmother.She reports that she has never smoked. She has never used smokeless tobacco. She reports current alcohol use of about 2.0 standard drinks of alcohol per week. She reports that she does not use drugs.  Outpatient Medications Prior to Visit  Medication Sig Dispense Refill  . albuterol (VENTOLIN HFA) 108 (90 Base) MCG/ACT inhaler Inhale 2 puffs into the lungs every 4 (four) hours as needed for wheezing or  shortness of breath. 1 Inhaler 5  . ALPRAZolam (XANAX) 1 MG tablet Take 1.5 tablets (1.5 mg total) by mouth at bedtime as needed for anxiety. 45 tablet 2  . azelastine (ASTELIN) 0.1 % nasal spray PLACE 2 SPRAYS INTO BOTH NOSTRILS 2 TIMES DAILY AS DIRECTED 30 mL 9  . bimatoprost (LATISSE) 0.03 % ophthalmic solution APPLY 1 DROP TO UPPER EYE LID AT BEDTIME AS DIRECTED 5 mL 6  . Clindamycin-Benzoyl Per, Refr, gel APPLY TO THE AFFECTED AREA(S) DAILY 45 g 11  . cyanocobalamin (,VITAMIN B-12,) 1000 MCG/ML injection INJECT 1 ML (1,000 MCG TOTAL) INTO THE MUSCLE EVERY 30 (THIRTY) DAYS. 3 mL 2  . desloratadine (CLARINEX) 5 MG tablet Take 1 tablet (5 mg total) by mouth daily. 90 tablet 3  . gabapentin (NEURONTIN) 300 MG capsule Take 1 capsule (300 mg total) by mouth 3 (three) times daily. 270 capsule 1  . lidocaine-prilocaine (EMLA) cream APPLY TOPICALLY AS NEEDED. 30 g 3  . mometasone (NASONEX) 50 MCG/ACT nasal spray PLACE 2 SPRAYS INTO THE NOSE DAILY. 17 g 9  . montelukast (SINGULAIR) 10 MG tablet TAKE 1 TABLET BY MOUTH AT BEDTIME. 90 tablet 3  . Multiple Vitamins-Minerals (CENTRUM WOMEN PO)     . omeprazole (PRILOSEC) 40 MG capsule TAKE 1  CAPSULE BY MOUTH 2 TIMES DAILY BEFORE A MEAL. 180 capsule 3  . ondansetron (ZOFRAN) 8 MG tablet Take 8 mg by mouth 3 (three) times daily.    . propranolol (INDERAL) 10 MG tablet Take 10 mg by mouth 3 (three) times daily.    . propranolol ER (INDERAL LA) 120 MG 24 hr capsule Take 1 capsule (120 mg total) by mouth daily. 90 capsule 3  . Semaglutide-Weight Management 0.5 MG/0.5ML SOAJ Inject 0.5 mg into the skin once a week. 2 mL 0  . SUMAtriptan (IMITREX) 100 MG tablet TAKE 1 TABLET BY MOUTH DAILY AS NEEDED. 9 tablet 3  . tamsulosin (FLOMAX) 0.4 MG CAPS capsule Take 1 capsule (0.4 mg total) by mouth as needed (for kidney stone). 30 capsule 6  . traMADol (ULTRAM) 50 MG tablet Take 1 tablet (50 mg total) by mouth every 6 (six) hours as needed. 90 tablet 5  . Vitamin D,  Ergocalciferol, (DRISDOL) 1.25 MG (50000 UNIT) CAPS capsule TAKE 1 CAPSULE BY MOUTH EVERY 7 DAYS ON SUNDAYS 12 capsule 3  . cyclobenzaprine (FLEXERIL) 10 MG tablet Take 10 mg by mouth 3 (three) times daily.    . Syringe/Needle, Disp, (SYRINGE 3CC/25GX1") 25G X 1" 3 ML MISC Use for b12 injections 50 each 0  . [START ON 06/09/2020] Semaglutide-Weight Management 1 MG/0.5ML SOAJ Inject 1 mg into the skin once a week. (Patient not taking: Reported on 05/24/2020) 2 mL 0  . busPIRone (BUSPAR) 30 MG tablet Take 1 tablet (30 mg total) by mouth 3 (three) times daily. (Patient not taking: Reported on 05/24/2020) 90 tablet 1  . ketorolac (TORADOL) 10 MG tablet TAKE 1 TABLET BY MOUTH 3 TIMES DAILY (Patient not taking: Reported on 05/24/2020) 20 tablet 0  . Semaglutide-Weight Management 0.25 MG/0.5ML SOAJ Inject 0.25 mg into the skin once a week. (Patient not taking: Reported on 05/24/2020) 2 mL 0  . UNIFINE PENTIPS 31G X 5 MM MISC  (Patient not taking: Reported on 05/24/2020)     No facility-administered medications prior to visit.    Review of Systems   Patient denies headache, fevers, malaise, unintentional weight loss, skin rash, eye pain, sinus congestion and sinus pain, sore throat, dysphagia,  hemoptysis , cough, dyspnea, wheezing, chest pain, palpitations, orthopnea, edema, abdominal pain, nausea, melena, diarrhea, constipation, flank pain, dysuria, hematuria, urinary  Frequency, nocturia, numbness, tingling, seizures,  Focal weakness, Loss of consciousness,  Tremor, insomnia, depression, anxiety, and suicidal ideation.      Objective:  BP 118/66 (BP Location: Left Arm, Patient Position: Sitting, Cuff Size: Large)   Pulse 83   Temp 98.1 F (36.7 C) (Oral)   Resp 14   Ht 5\' 7"  (1.702 m)   Wt 250 lb 3.2 oz (113.5 kg)   SpO2 98%   BMI 39.19 kg/m   Physical Exam  General appearance: alert, cooperative and appears stated age Ears: normal TM's and external ear canals both ears Throat: lips, mucosa, and  tongue normal; teeth and gums normal Neck: no adenopathy, no carotid bruit, supple, symmetrical, trachea midline and thyroid not enlarged, symmetric, no tenderness/mass/nodules Back: symmetric, no curvature. ROM normal. No CVA tenderness. Lungs: clear to auscultation bilaterally Heart: regular rate and rhythm, S1, S2 normal, no murmur, click, rub or gallop Abdomen: soft, non-tender; bowel sounds normal; no masses,  no organomegaly Pulses: 2+ and symmetric Skin: Skin color, texture, turgor normal. No rashes or lesions Lymph nodes: Cervical, supraclavicular, and axillary nodes normal.  Assessment & Plan:   Problem List Items  Addressed This Visit      Unprioritized   Diarrhea in adult patient    Infectious etiologies suspected.  GI pathogen panel ordered and work note writtten.  [place] X 2       Encounter for preventive health examination    age appropriate education and counseling updated, referrals for preventative services and immunizations addressed, dietary and smoking counseling addressed, most recent labs reviewed.  I have personally reviewed and have noted:  1) the patient's medical and social history 2) The pt's use of alcohol, tobacco, and illicit drugs 3) The patient's current medications and supplements 4) Functional ability including ADL's, fall risk, home safety risk, hearing and visual impairment 5) Diet and physical activities 6) Evidence for depression or mood disorder 7) The patient's height, weight, and BMI have been recorded in the chart  I have made referrals, and provided counseling and education based on review of the above      Iron deficiency anemia    Secondary to menometrorrhagia.  Resolved,  Continue once daily iron supplement   Lab Results  Component Value Date   WBC 5.9 05/24/2020   HGB 13.8 05/24/2020   HCT 40.4 05/24/2020   MCV 86.3 05/24/2020   PLT 262.0 05/24/2020   Lab Results  Component Value Date   IRON 92 12/15/2019    TIBC 379 12/15/2019   FERRITIN 27 12/15/2019        Relevant Orders   Iron, TIBC and Ferritin Panel   Menometrorrhagia    Historically cotrolled with OCPS ,the with hormone releasing IUD, which has also failed.   PCOS likely, but has not had complete gyn evaluation or [place] since 2019  Lab Results  Component Value Date   TSH 1.53 05/24/2020         Obesity    Worsening despite gastric sleeve procedure remotely.  Pharmacotherapy employed but she is not exercising .  Will recommend the [place].       Relevant Orders   TSH (Completed)   Hemoglobin A1c (Completed)   Lipid panel (Completed)   Ovarian cyst, left    Noted on 2019 [place] ordered by DR Ward, No workup done by GYN yet , referring to Dr cousins for definitive evaluation      Vitamin D deficiency   Relevant Orders   VITAMIN D 25 Hydroxy (Vit-D Deficiency, Fractures) (Completed)    Other Visit Diagnoses    Chronic midline low back pain without sciatica    -  Primary   Relevant Medications   cyclobenzaprine (FLEXERIL) 10 MG tablet   Other Relevant Orders   DG Lumbar Spine 2-3 Views (Completed)   Diarrhea of presumed infectious origin       Relevant Orders   Comprehensive metabolic panel (Completed)   Magnesium (Completed)   GI pathogen panel by PCR, stool   CBC with Differential/Platelet (Completed)   Sedimentation rate (Completed)      I have discontinued Tammy Christian "[name]"'s SYRINGE 3CC/25GX1", ketorolac, Unifine Pentips, and busPIRone. I have also changed her cyclobenzaprine. Additionally, I am having her maintain her bimatoprost, tamsulosin, albuterol, Multiple Vitamins-Minerals (CENTRUM WOMEN PO), ondansetron, desloratadine, azelastine, mometasone, cyanocobalamin, propranolol ER, lidocaine-prilocaine, Vitamin D (Ergocalciferol), Clindamycin-Benzoyl Per (Refr), propranolol, SUMAtriptan, omeprazole, montelukast, ALPRAZolam, gabapentin, traMADol, Semaglutide-Weight Management, and Semaglutide-Weight  Management.  Meds ordered this encounter  Medications  . cyclobenzaprine (FLEXERIL) 10 MG tablet    Sig: Take 1 tablet (10 mg total) by mouth 3 (three) times daily.  Dispense:  90 tablet    Refill:  1    KEEP ON FILE FOR FUTURE REFILLS    Medications Discontinued During This Encounter  Medication Reason  . busPIRone (BUSPAR) 30 MG tablet Allergic reaction  . ketorolac (TORADOL) 10 MG tablet   . Semaglutide-Weight Management 0.25 MG/0.5ML SOAJ Completed Course  . Syringe/Needle, Disp, (SYRINGE 3CC/25GX1") 25G X 1" 3 ML MISC   . UNIFINE PENTIPS 31G X 5 MM MISC   . cyclobenzaprine (FLEXERIL) 10 MG tablet Reorder    Follow-up: No follow-ups on file.   Crecencio Mc, MD

## 2020-05-24 NOTE — Assessment & Plan Note (Signed)

## 2020-05-24 NOTE — Assessment & Plan Note (Signed)
Worsening despite gastric sleeve procedure remotely.  Pharmacotherapy employed but she is not exercising .  Will recommend the [place].

## 2020-05-24 NOTE — Assessment & Plan Note (Addendum)
Historically cotrolled with OCPS ,the with hormone releasing IUD, which has also failed.   PCOS likely, but has not had complete gyn evaluation or [place] since 2019  Lab Results  Component Value Date   TSH 1.53 05/24/2020

## 2020-05-24 NOTE — Assessment & Plan Note (Signed)
Secondary to menometrorrhagia.  Resolved,  Continue once daily iron supplement   Lab Results  Component Value Date   WBC 5.9 05/24/2020   HGB 13.8 05/24/2020   HCT 40.4 05/24/2020   MCV 86.3 05/24/2020   PLT 262.0 05/24/2020   Lab Results  Component Value Date   IRON 92 12/15/2019   TIBC 379 12/15/2019   FERRITIN 27 12/15/2019

## 2020-05-24 NOTE — Patient Instructions (Signed)
Health Maintenance, Female Adopting a healthy lifestyle and getting preventive care are important in promoting health and wellness. Ask your health care provider about:  The right schedule for you to have regular tests and exams.  Things you can do on your own to prevent diseases and keep yourself healthy. What should I know about diet, weight, and exercise? Eat a healthy diet  Eat a diet that includes plenty of vegetables, fruits, low-fat dairy products, and lean protein.  Do not eat a lot of foods that are high in solid fats, added sugars, or sodium.   Maintain a healthy weight Body mass index (BMI) is used to identify weight problems. It estimates body fat based on height and weight. Your health care provider can help determine your BMI and help you achieve or maintain a healthy weight. Get regular exercise Get regular exercise. This is one of the most important things you can do for your health. Most adults should:  Exercise for at least 150 minutes each week. The exercise should increase your heart rate and make you sweat (moderate-intensity exercise).  Do strengthening exercises at least twice a week. This is in addition to the moderate-intensity exercise.  Spend less time sitting. Even light physical activity can be beneficial. Watch cholesterol and blood lipids Have your blood tested for lipids and cholesterol at 42 years of age, then have this test every 5 years. Have your cholesterol levels checked more often if:  Your lipid or cholesterol levels are high.  You are older than 42 years of age.  You are at high risk for heart disease. What should I know about cancer screening? Depending on your health history and family history, you may need to have cancer screening at various ages. This may include screening for:  Breast cancer.  Cervical cancer.  Colorectal cancer.  Skin cancer.  Lung cancer. What should I know about heart disease, diabetes, and high blood  pressure? Blood pressure and heart disease  High blood pressure causes heart disease and increases the risk of stroke. This is more likely to develop in people who have high blood pressure readings, are of African descent, or are overweight.  Have your blood pressure checked: ? Every 3-5 years if you are 18-39 years of age. ? Every year if you are 40 years old or older. Diabetes Have regular diabetes screenings. This checks your fasting blood sugar level. Have the screening done:  Once every three years after age 40 if you are at a normal weight and have a low risk for diabetes.  More often and at a younger age if you are overweight or have a high risk for diabetes. What should I know about preventing infection? Hepatitis B If you have a higher risk for hepatitis B, you should be screened for this virus. Talk with your health care provider to find out if you are at risk for hepatitis B infection. Hepatitis C Testing is recommended for:  Everyone born from 1945 through 1965.  Anyone with known risk factors for hepatitis C. Sexually transmitted infections (STIs)  Get screened for STIs, including gonorrhea and chlamydia, if: ? You are sexually active and are younger than 42 years of age. ? You are older than 42 years of age and your health care provider tells you that you are at risk for this type of infection. ? Your sexual activity has changed since you were last screened, and you are at increased risk for chlamydia or gonorrhea. Ask your health care provider   if you are at risk.  Ask your health care provider about whether you are at high risk for HIV. Your health care provider may recommend a prescription medicine to help prevent HIV infection. If you choose to take medicine to prevent HIV, you should first get tested for HIV. You should then be tested every 3 months for as long as you are taking the medicine. Pregnancy  If you are about to stop having your period (premenopausal) and  you may become pregnant, seek counseling before you get pregnant.  Take 400 to 800 micrograms (mcg) of folic acid every day if you become pregnant.  Ask for birth control (contraception) if you want to prevent pregnancy. Osteoporosis and menopause Osteoporosis is a disease in which the bones lose minerals and strength with aging. This can result in bone fractures. If you are 65 years old or older, or if you are at risk for osteoporosis and fractures, ask your health care provider if you should:  Be screened for bone loss.  Take a calcium or vitamin D supplement to lower your risk of fractures.  Be given hormone replacement therapy (HRT) to treat symptoms of menopause. Follow these instructions at home: Lifestyle  Do not use any products that contain nicotine or tobacco, such as cigarettes, e-cigarettes, and chewing tobacco. If you need help quitting, ask your health care provider.  Do not use street drugs.  Do not share needles.  Ask your health care provider for help if you need support or information about quitting drugs. Alcohol use  Do not drink alcohol if: ? Your health care provider tells you not to drink. ? You are pregnant, may be pregnant, or are planning to become pregnant.  If you drink alcohol: ? Limit how much you use to 0-1 drink a day. ? Limit intake if you are breastfeeding.  Be aware of how much alcohol is in your drink. In the [place] one drink equals one 12 oz bottle of beer (355 mL), one 5 oz glass of wine (148 mL), or one 1 oz glass of hard liquor (44 mL). General instructions  Schedule regular health, dental, and eye exams.  Stay current with your vaccines.  Tell your health care provider if: ? You often feel depressed. ? You have ever been abused or do not feel safe at home. Summary  Adopting a healthy lifestyle and getting preventive care are important in promoting health and wellness.  Follow your health care provider's instructions about healthy  diet, exercising, and getting tested or screened for diseases.  Follow your health care provider's instructions on monitoring your cholesterol and blood pressure. This information is not intended to replace advice given to you by your health care provider. Make sure you discuss any questions you have with your health care provider. Document Revised: 02/27/2018 Document Reviewed: 02/27/2018 Elsevier Patient Education  2021 Elsevier Inc.  

## 2020-05-25 ENCOUNTER — Other Ambulatory Visit
Admission: RE | Admit: 2020-05-25 | Discharge: 2020-05-25 | Disposition: A | Discharge status: Home / Self Care | Payer: 59 | Source: Ambulatory Visit | Attending: Internal Medicine | Admitting: Internal Medicine

## 2020-05-25 DIAGNOSIS — R197 Diarrhea, unspecified: Secondary | ICD-10-CM | POA: Diagnosis not present

## 2020-05-25 LAB — IRON,TIBC AND FERRITIN PANEL
%SAT: 16 % (calc) (ref 16–45)
Ferritin: 11 ng/mL — ABNORMAL LOW (ref 16–232)
Iron: 59 ug/dL (ref 40–190)
TIBC: 375 mcg/dL (calc) (ref 250–450)

## 2020-05-25 LAB — GASTROINTESTINAL PANEL BY PCR, STOOL (REPLACES STOOL CULTURE)

## 2020-05-30 ENCOUNTER — Telehealth: Payer: Self-pay | Admitting: Internal Medicine

## 2020-05-31 ENCOUNTER — Telehealth: Payer: Self-pay | Admitting: Internal Medicine

## 2020-05-31 DIAGNOSIS — Z0279 Encounter for issue of other medical certificate: Secondary | ICD-10-CM

## 2020-05-31 NOTE — Telephone Encounter (Signed)
Completed, faxed and charged.

## 2020-05-31 NOTE — Telephone Encounter (Signed)
[employer] LOA form for work absence 3/4 to 3/10 completed and in red folder.  Please submit charge of $50

## 2020-06-23 ENCOUNTER — Other Ambulatory Visit: Payer: Self-pay | Admitting: Internal Medicine

## 2020-06-23 ENCOUNTER — Other Ambulatory Visit: Payer: Self-pay

## 2020-06-23 MED ORDER — WEGOVY 1 MG/0.5ML ~~LOC~~ SOAJ
1.0000 mg | SUBCUTANEOUS | 1 refills | Status: DC
  Filled 2020-06-23: qty 2, 28d supply, fill #0

## 2020-06-23 MED ORDER — ALPRAZOLAM 1 MG PO TABS: ORAL_TABLET | ORAL | 2 refills | Status: DC

## 2020-06-23 MED FILL — Cyclobenzaprine HCl Tab 10 MG: ORAL | 30 days supply | Qty: 90 | Fill #0 | Status: AC

## 2020-06-23 MED FILL — Tramadol HCl Tab 50 MG: ORAL | 23 days supply | Qty: 90 | Fill #0 | Status: AC

## 2020-06-24 ENCOUNTER — Other Ambulatory Visit: Payer: Self-pay | Admitting: Internal Medicine

## 2020-06-24 ENCOUNTER — Other Ambulatory Visit: Payer: Self-pay

## 2020-06-24 ENCOUNTER — Other Ambulatory Visit (INDEPENDENT_AMBULATORY_CARE_PROVIDER_SITE_OTHER): Payer: Self-pay | Admitting: Vascular Surgery

## 2020-06-24 MED ORDER — AZELASTINE HCL 0.1 % NA SOLN
NASAL | 9 refills | Status: DC
  Filled 2020-06-24: qty 30, 30d supply, fill #0
  Filled 2021-02-01: qty 30, 30d supply, fill #1
  Filled 2021-05-03 – 2021-05-06 (×2): qty 30, 30d supply, fill #2

## 2020-06-24 MED ORDER — MOMETASONE FUROATE 50 MCG/ACT NA SUSP
2.0000 | Freq: Every day | NASAL | 9 refills | Status: DC
  Filled 2020-06-24 – 2021-02-01 (×2): qty 17, 30d supply, fill #0
  Filled 2021-05-03 – 2021-05-06 (×2): qty 17, fill #0

## 2020-06-24 MED ORDER — ALPRAZOLAM 1 MG PO TABS
1.5000 mg | ORAL_TABLET | Freq: Every evening | ORAL | 2 refills | Status: DC | PRN
  Filled 2020-06-24: qty 45, 30d supply, fill #0
  Filled 2020-07-21: qty 45, 30d supply, fill #1
  Filled 2020-08-17: qty 45, 30d supply, fill #2

## 2020-06-24 MED FILL — Gabapentin Cap 300 MG: ORAL | 90 days supply | Qty: 270 | Fill #0 | Status: AC

## 2020-06-24 MED FILL — Clindamycin Phosph-Benzoyl Peroxide (Refrig) Gel 1.2 (1)-5%: CUTANEOUS | 30 days supply | Qty: 45 | Fill #0 | Status: AC

## 2020-06-25 ENCOUNTER — Other Ambulatory Visit: Payer: Self-pay

## 2020-06-25 MED ORDER — BIMATOPROST 0.03 % EX SOLN
CUTANEOUS | 6 refills | Status: DC
  Filled 2020-06-25: qty 5, 30d supply, fill #0
  Filled 2020-10-11: qty 3, 30d supply, fill #1
  Filled 2020-11-09: qty 3, 30d supply, fill #2
  Filled 2021-02-01: qty 3, 30d supply, fill #3
  Filled 2021-03-05: qty 3, 30d supply, fill #4

## 2020-06-28 ENCOUNTER — Other Ambulatory Visit: Payer: Self-pay

## 2020-06-28 ENCOUNTER — Other Ambulatory Visit: Payer: Self-pay | Admitting: Internal Medicine

## 2020-06-28 MED ORDER — SUMATRIPTAN SUCCINATE 100 MG PO TABS
ORAL_TABLET | Freq: Every day | ORAL | 3 refills | Status: DC | PRN
  Filled 2020-06-28: qty 9, 30d supply, fill #0
  Filled 2020-10-09: qty 9, 30d supply, fill #1
  Filled 2020-11-16: qty 9, 30d supply, fill #2
  Filled 2020-12-06: qty 9, 30d supply, fill #3

## 2020-06-28 MED ORDER — ALBUTEROL SULFATE HFA 108 (90 BASE) MCG/ACT IN AERS
2.0000 | INHALATION_SPRAY | RESPIRATORY_TRACT | 5 refills | Status: DC | PRN
  Filled 2020-06-28: qty 18, 25d supply, fill #0
  Filled 2021-02-01: qty 18, 25d supply, fill #1

## 2020-06-28 MED ORDER — DESLORATADINE 5 MG PO TABS
ORAL_TABLET | Freq: Every day | ORAL | 3 refills | Status: DC
  Filled 2020-06-28: qty 90, 90d supply, fill #0
  Filled 2020-11-09: qty 90, 90d supply, fill #1
  Filled 2021-02-01: qty 90, 90d supply, fill #2
  Filled 2021-05-03 – 2021-05-06 (×2): qty 90, 90d supply, fill #3

## 2020-06-28 MED ORDER — CYANOCOBALAMIN 1000 MCG/ML IJ SOLN
INTRAMUSCULAR | 2 refills | Status: DC
  Filled 2020-06-28: qty 3, 90d supply, fill #0
  Filled 2020-10-09: qty 3, 90d supply, fill #1
  Filled 2021-02-01: qty 3, 90d supply, fill #2

## 2020-06-28 MED FILL — Omeprazole Cap Delayed Release 40 MG: ORAL | 90 days supply | Qty: 180 | Fill #0 | Status: AC

## 2020-06-28 MED FILL — Ergocalciferol Cap 1.25 MG (50000 Unit): ORAL | 84 days supply | Qty: 12 | Fill #0 | Status: AC

## 2020-06-28 MED FILL — Ketorolac Tromethamine Tab 10 MG: ORAL | 5 days supply | Qty: 20 | Fill #0 | Status: AC

## 2020-06-28 NOTE — Telephone Encounter (Signed)
I have let pt know when her last Tdap vaccine was.

## 2020-06-29 ENCOUNTER — Other Ambulatory Visit: Payer: Self-pay

## 2020-06-29 MED ORDER — FLUTICASONE PROPIONATE 50 MCG/ACT NA SUSP
2.0000 | Freq: Every day | NASAL | 5 refills | Status: DC
  Filled 2020-06-29: qty 16, 30d supply, fill #0
  Filled 2021-02-01: qty 16, 30d supply, fill #1

## 2020-06-30 ENCOUNTER — Other Ambulatory Visit: Payer: Self-pay

## 2020-06-30 IMAGING — DX DG LUMBAR SPINE COMPLETE 4+V
5 series · 5 of 5 positions shown · non-contrast
Comparison: [DATE]

CLINICAL DATA: Acute on chronic low back pain, history of prior
fusion

EXAM:
LUMBAR SPINE - COMPLETE 4+ VIEW

[lumbar spine ap]
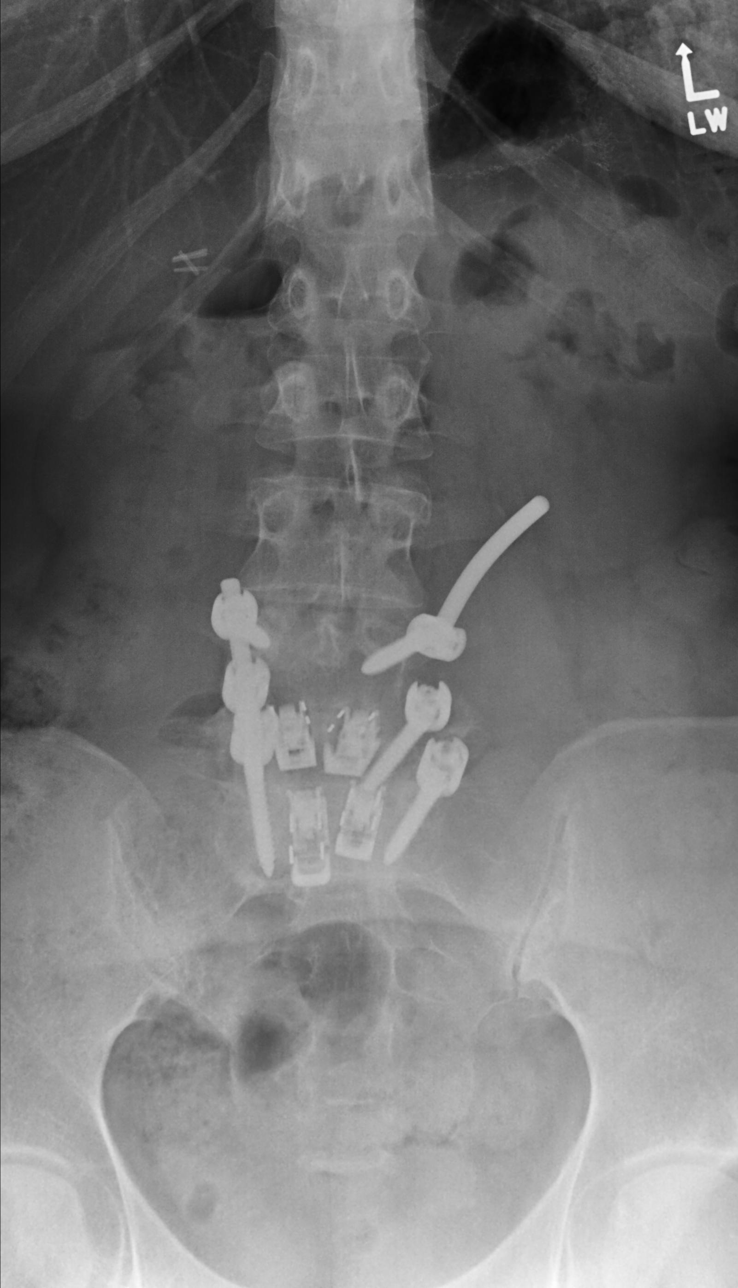

[lumbar spine obl (oblique) (1 of 2)]
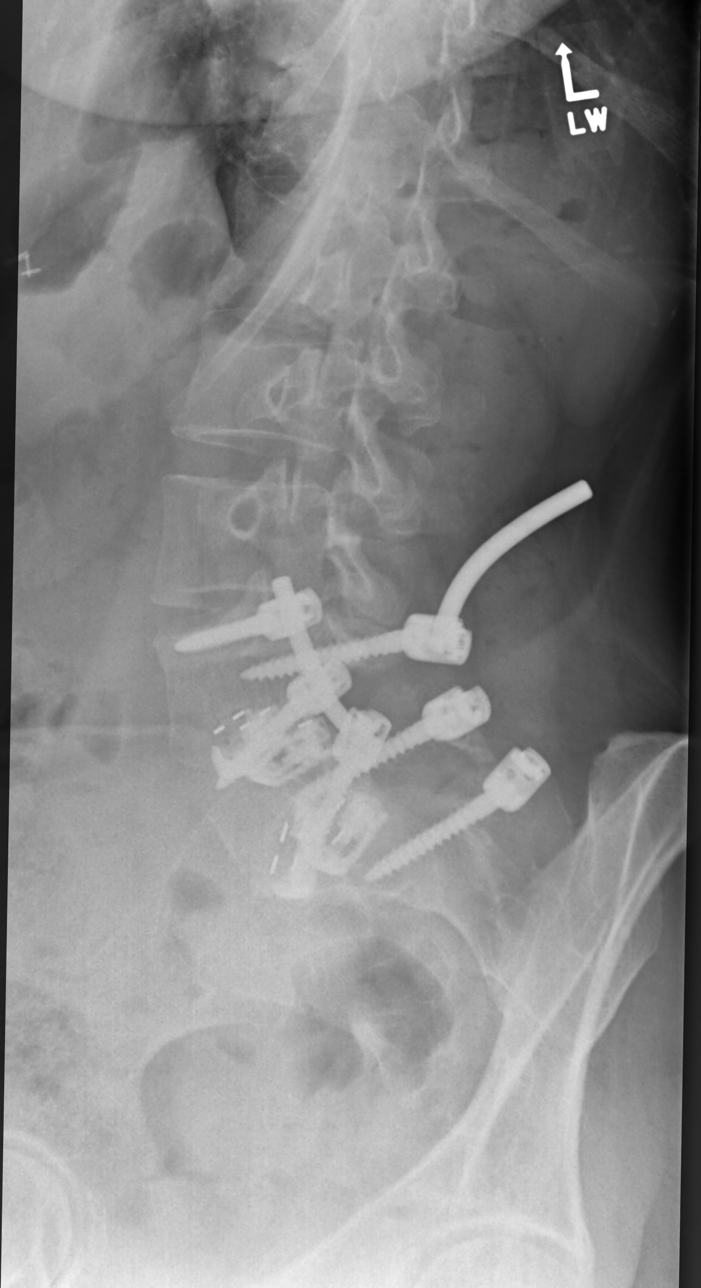

[lumbar spine obl (oblique) (2 of 2)]
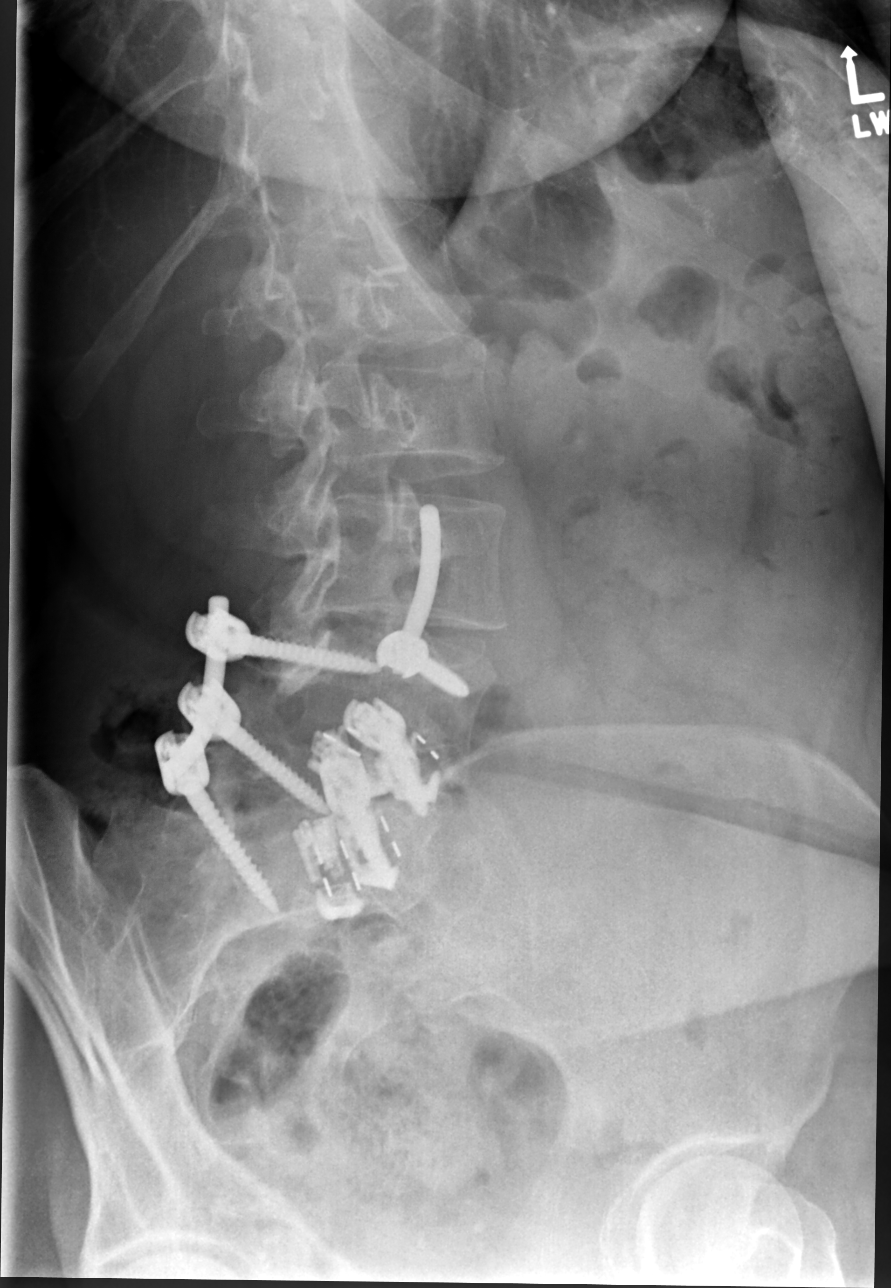

[lumbar spine lat]
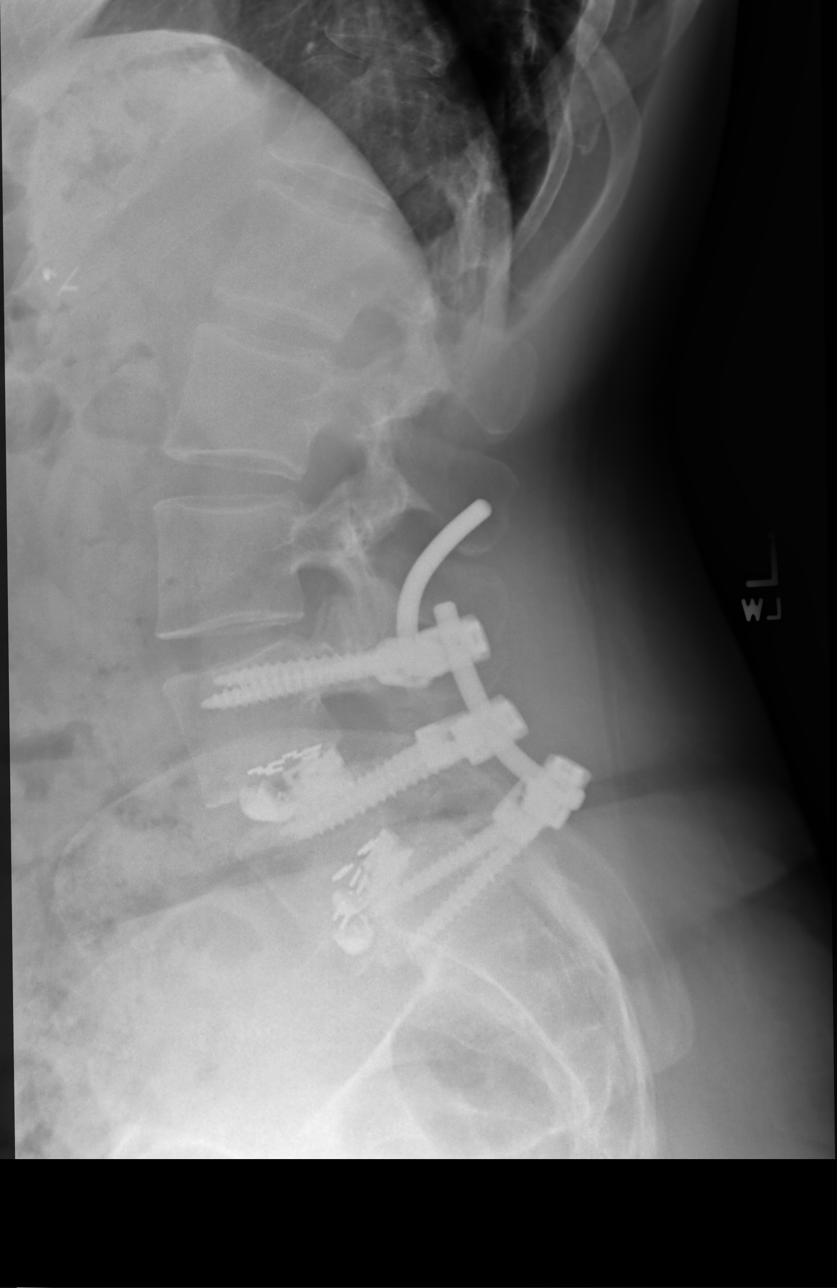

[lumbar spot lat]
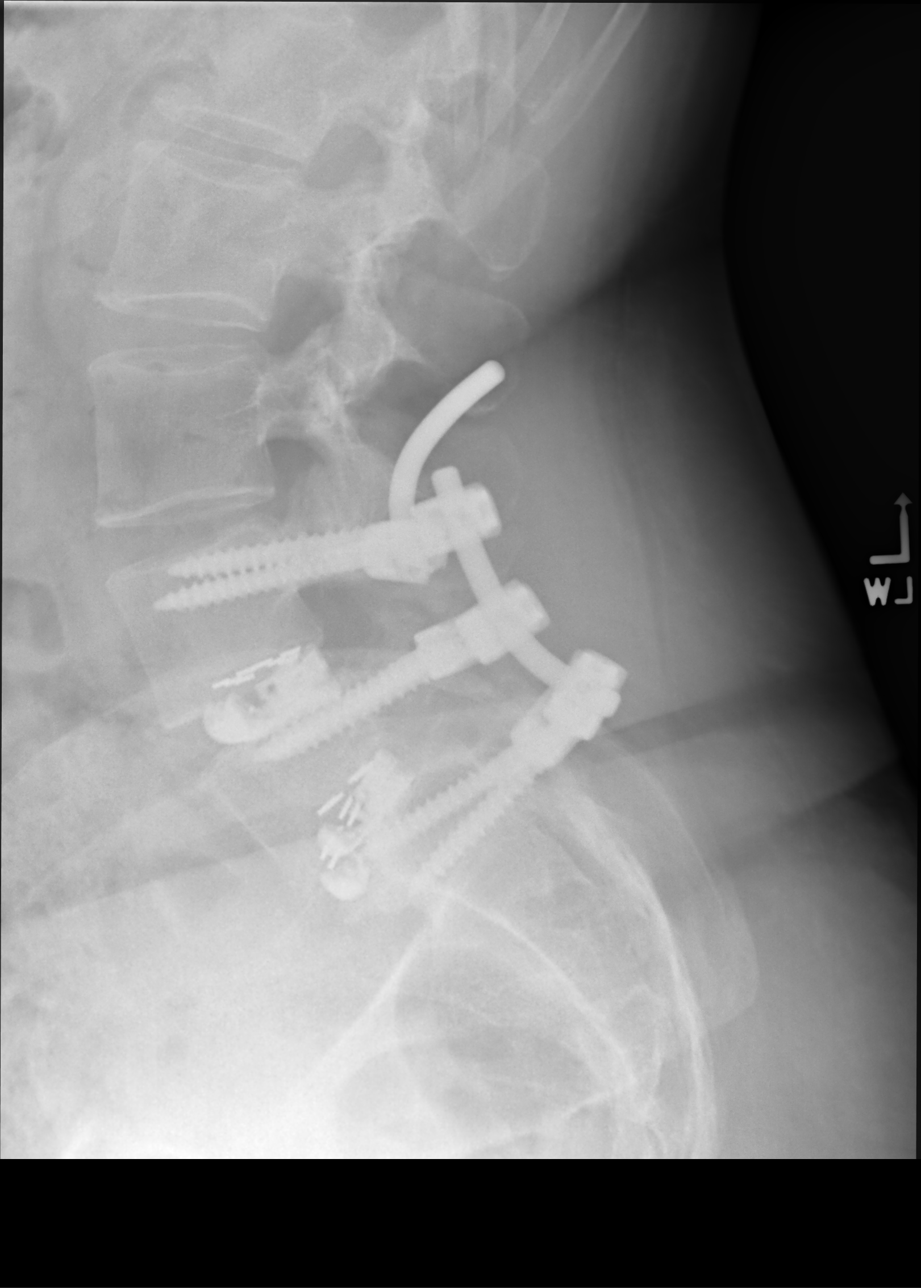

[5 of 5 positions shown; findings below may reference images not displayed]

FINDINGS: Interbody fusion is noted at L4-5 and L5-S1. Pedicle screws are
noted bilaterally. The fixation rod on the left is again dislodged
superiorly only within the L4 pedicle screw. It has shown some
upward mobility when compared with the prior exam. No acute fracture
is noted. No pars defects are seen.
IMPRESSION: Hardware failure with continued upward displacement of the fixation
rod on the left.

No acute abnormality noted.

## 2020-07-02 ENCOUNTER — Other Ambulatory Visit: Payer: Self-pay

## 2020-07-02 MED ORDER — TETANUS-DIPHTHERIA TOXOIDS TD 2-2 LF/0.5ML IM SUSP
INTRAMUSCULAR | 0 refills | Status: DC
  Filled 2020-07-02: qty 0.5, 1d supply, fill #0

## 2020-07-21 ENCOUNTER — Other Ambulatory Visit: Payer: Self-pay | Admitting: Internal Medicine

## 2020-07-21 ENCOUNTER — Other Ambulatory Visit: Payer: Self-pay

## 2020-07-21 MED ORDER — PROPRANOLOL HCL ER 120 MG PO CP24
ORAL_CAPSULE | Freq: Every day | ORAL | 3 refills | Status: DC
  Filled 2020-07-21: qty 90, 90d supply, fill #0
  Filled 2020-10-12: qty 90, 90d supply, fill #1
  Filled 2020-11-09 – 2021-01-14 (×2): qty 90, 90d supply, fill #2
  Filled 2021-02-01 – 2021-05-06 (×4): qty 90, 90d supply, fill #3

## 2020-07-21 MED FILL — Cyclobenzaprine HCl Tab 10 MG: ORAL | 30 days supply | Qty: 90 | Fill #1 | Status: AC

## 2020-07-21 MED FILL — Cyclobenzaprine HCl Tab 10 MG: ORAL | 30 days supply | Qty: 90 | Fill #1 | Status: CN

## 2020-07-21 MED FILL — Tramadol HCl Tab 50 MG: ORAL | 23 days supply | Qty: 90 | Fill #1 | Status: AC

## 2020-07-29 ENCOUNTER — Other Ambulatory Visit: Payer: Self-pay

## 2020-08-01 MED ORDER — WEGOVY 1.7 MG/0.75ML ~~LOC~~ SOAJ
1.7000 mg | SUBCUTANEOUS | 1 refills | Status: DC
  Filled 2020-08-01 – 2020-08-02 (×2): qty 3, 28d supply, fill #0

## 2020-08-02 ENCOUNTER — Other Ambulatory Visit: Payer: Self-pay

## 2020-08-03 ENCOUNTER — Other Ambulatory Visit: Payer: Self-pay

## 2020-08-17 ENCOUNTER — Other Ambulatory Visit: Payer: Self-pay | Admitting: Internal Medicine

## 2020-08-17 ENCOUNTER — Other Ambulatory Visit: Payer: Self-pay

## 2020-08-17 MED ORDER — GABAPENTIN 300 MG PO CAPS
ORAL_CAPSULE | Freq: Three times a day (TID) | ORAL | 1 refills | Status: DC
  Filled 2020-08-17 – 2020-08-20 (×2): qty 270, 90d supply, fill #0
  Filled 2020-11-16 – 2021-02-03 (×2): qty 270, 90d supply, fill #1

## 2020-08-17 MED FILL — Tramadol HCl Tab 50 MG: ORAL | 23 days supply | Qty: 90 | Fill #2 | Status: AC

## 2020-08-20 ENCOUNTER — Other Ambulatory Visit: Payer: Self-pay

## 2020-08-23 ENCOUNTER — Other Ambulatory Visit: Payer: Self-pay

## 2020-08-23 ENCOUNTER — Ambulatory Visit: Payer: 59 | Admitting: Internal Medicine

## 2020-08-23 ENCOUNTER — Encounter: Payer: Self-pay | Admitting: Internal Medicine

## 2020-08-23 VITALS — BP 114/70 | HR 103 | Temp 96.6°F | Resp 15 | Ht 67.0 in | Wt 216.8 lb

## 2020-08-23 DIAGNOSIS — R748 Abnormal levels of other serum enzymes: Secondary | ICD-10-CM

## 2020-08-23 DIAGNOSIS — F411 Generalized anxiety disorder: Secondary | ICD-10-CM

## 2020-08-23 DIAGNOSIS — Z6841 Body Mass Index (BMI) 40.0 and over, adult: Secondary | ICD-10-CM | POA: Diagnosis not present

## 2020-08-23 DIAGNOSIS — F331 Major depressive disorder, recurrent, moderate: Secondary | ICD-10-CM

## 2020-08-23 DIAGNOSIS — R5383 Other fatigue: Secondary | ICD-10-CM

## 2020-08-23 DIAGNOSIS — F339 Major depressive disorder, recurrent, unspecified: Secondary | ICD-10-CM | POA: Insufficient documentation

## 2020-08-23 MED ORDER — MIRTAZAPINE 7.5 MG PO TABS
7.5000 mg | ORAL_TABLET | Freq: Every day | ORAL | 1 refills | Status: DC
  Filled 2020-08-23: qty 90, 90d supply, fill #0

## 2020-08-23 NOTE — Assessment & Plan Note (Signed)
Did not tolerate trial of buspirone 30 mg bid. Did not tolerate trial of temazepam for insomnia . Using alprazolam .  History of cloonazepam use.

## 2020-08-23 NOTE — Assessment & Plan Note (Signed)
Does not  want to try SSRI therapy.  Trial of mirtazipine given anorexia (severe) , insomnia and fatigue .  Referral to psychiatry

## 2020-08-23 NOTE — Assessment & Plan Note (Signed)
She has lost 35 lbs on Ozempic but is consuming less than 500 calories most days. Advice given about diet .advised to stay on current dose until weight plateaus

## 2020-08-23 NOTE — Patient Instructions (Addendum)
You  Need a minimum of 500 to 800 calories   Daily and  15- 20 g sugar to support brain health   Please start eating some fruit to supplement the [place] protein   I am recommending a trial of mirtazapine to help stimulate SOME appetite and help you sleep while we get your referral to psychiatry under way   Start with 7.5 mg at bedtime or 30 minutes prior.  Increase to 15 mg after 2 weeks if tolerated.

## 2020-08-23 NOTE — Progress Notes (Signed)
Subjective:  Patient ID: Tammy Christian, female    DOB: [birth year]  Age: 40 y.o. MRN: [id number]  CC: The primary encounter diagnosis was Moderate episode of recurrent major depressive disorder ([place]). Diagnoses of Fatigue, unspecified type, Generalized anxiety disorder, and Class 3 severe obesity due to excess calories without serious comorbidity with body mass index (BMI) of 40.0 to 44.9 in adult [facility]) were also pertinent to this visit.  HPI Tammy Christian presents for follow up on pharmacotherapy for weight loss   This visit occurred during the SARS-CoV-2 public health emergency.  Safety protocols were in place, including screening questions prior to the visit, additional usage of staff PPE, and extensive cleaning of exam room while observing appropriate contact time as indicated for disinfecting solutions.   Started [facility] in March after a weight gain of 45 lbs since March 2020.  Has had a 35 lb wt loss since then . Using zofran for recurrent nausea which is occurring about 2 times per week ,  Occurring after each titration. Weight  Has not plateaued yet.   Appetite severely limited.  tolerates protein shakes better than food.  Premier protein shakes  160 cal,  Sometimes just one daily.  Staying hydrated.  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  She feels that her depression getting worse. Dreads going to work (cried today on the way) ; feels burnt out,  Easily irritated getting into arguments at work due to being "Type A, from [place] and confrontational." Not seeig a psychiatrist but thinks she may need to .  Denies suicidality,  But not leaving house except for work.  Not drinking.  Spends the weekends in bed .  Not sleeping well.   Work load is variable : some weeks are hell ,  Some she is out by 4 pm.  Previous trials of SSRI's not tolerated or helpful.  Using up to 1.5 mg alprazolam daily for sleep or anxiety  Using 1-2  tramadol daily for back pain   Outpatient Medications Prior to Visit  Medication Sig Dispense Refill  . albuterol (VENTOLIN HFA) 108 (90 Base) MCG/ACT inhaler Inhale 2 puffs into the lungs every 4 (four) hours as needed for wheezing or shortness of breath. 18 g 5  . ALPRAZolam (XANAX) 1 MG tablet Take 1.5 tablets (1.5 mg total) by mouth at bedtime as needed. 45 tablet 2  . azelastine (ASTELIN) 0.1 % nasal spray PLACE 2 SPRAYS INTO BOTH NOSTRILS 2 TIMES DAILY AS DIRECTED 30 mL 9  . bimatoprost (LATISSE) 0.03 % ophthalmic solution APPLY 1 DROP TO UPPER EYE LID AT BEDTIME AS DIRECTED 5 mL 6  . Clindamycin-Benzoyl Per, Refr, gel APPLY TO THE AFFECTED AREA(S) DAILY 45 g 11  . cyanocobalamin (,VITAMIN B-12,) 1000 MCG/ML injection INJECT 1 MILLILITER (ML) INTO THE MUSCLE EVERY THIRTY DAYS 3 mL 2  . cyclobenzaprine (FLEXERIL) 10 MG tablet TAKE 1 TABLET BY MOUTH 3 TIMES DAILY. 90 tablet 1  . desloratadine (CLARINEX) 5 MG tablet TAKE 1 TABLET (5 MG TOTAL) BY MOUTH DAILY. 90 tablet 3  . diptheria-tetanus toxoids (TDVAX) 2-2 LF/0.5ML injection Inject into the muscle. 0.5 mL 0  . fluticasone (FLONASE) 50 MCG/ACT nasal spray Place 2 sprays into both nostrils daily. 16 g 5  . gabapentin (NEURONTIN) 300 MG capsule TAKE 1 CAPSULE (300 MG TOTAL) BY MOUTH 3 (THREE) TIMES DAILY. 270 capsule 1  . lidocaine-prilocaine (EMLA) cream APPLY TOPICALLY  AS NEEDED. 30 g 3  . mometasone (NASONEX) 50 MCG/ACT nasal spray PLACE 2 SPRAYS INTO THE NOSE DAILY. 17 g 9  . montelukast (SINGULAIR) 10 MG tablet TAKE 1 TABLET BY MOUTH AT BEDTIME. 90 tablet 3  . Multiple Vitamins-Minerals (CENTRUM WOMEN PO)     . omeprazole (PRILOSEC) 40 MG capsule TAKE 1 CAPSULE BY MOUTH 2 TIMES DAILY BEFORE A MEAL. 180 capsule 3  . ondansetron (ZOFRAN) 8 MG tablet TAKE 1 TABLET BY MOUTH EVERY 8 HOURS AS NEEDED 90 tablet 4  . propranolol (INDERAL) 10 MG tablet Take 10 mg by mouth 3 (three) times daily.    . propranolol ER (INDERAL LA) 120 MG 24 hr  capsule TAKE 1 CAPSULE BY MOUTH DAILY. 90 capsule 3  . Semaglutide-Weight Management (WEGOVY) 1.7 MG/0.75ML SOAJ Inject 1.7 mg into the skin once a week. 3 mL 1  . SUMAtriptan (IMITREX) 100 MG tablet TAKE 1 TABLET BY MOUTH DAILY AS NEEDED. 9 tablet 3  . tamsulosin (FLOMAX) 0.4 MG CAPS capsule Take 1 capsule (0.4 mg total) by mouth as needed (for kidney stone). 30 capsule 6  . traMADol (ULTRAM) 50 MG tablet TAKE 1 TABLET (50 MG TOTAL) BY MOUTH EVERY 6 (SIX) HOURS AS NEEDED. 90 tablet 5  . Vitamin D, Ergocalciferol, (DRISDOL) 1.25 MG (50000 UNIT) CAPS capsule TAKE 1 CAPSULE BY MOUTH EVERY 7 DAYS ON SUNDAYS 12 capsule 3  . ondansetron (ZOFRAN) 8 MG tablet Take 8 mg by mouth 3 (three) times daily.    . Semaglutide-Weight Management (WEGOVY) 1 MG/0.5ML SOAJ INJECT 1 MG INTO THE SKIN ONCE A WEEK. 2 mL 1  . Semaglutide-Weight Management 0.5 MG/0.5ML SOAJ INJECT 0.5 MG INTO THE SKIN ONCE A WEEK. 2 mL 0  . cyclobenzaprine (FLEXERIL) 10 MG tablet TAKE 1 TABLET BY MOUTH EVERY 8 HOURS AS NEEDED FOR MUSCLE SPASMS (Patient not taking: Reported on 08/23/2020) 90 tablet 5  . gabapentin (NEURONTIN) 300 MG capsule TAKE 1 CAPSULE BY MOUTH 3 TIMES DAILY AS NEEDED 90 capsule 5   No facility-administered medications prior to visit.    Review of Systems;  Patient denies headache, fevers, malaise, unintentional weight loss, skin rash, eye pain, sinus congestion and sinus pain, sore throat, dysphagia,  hemoptysis , cough, dyspnea, wheezing, chest pain, palpitations, orthopnea, edema, abdominal pain, nausea, melena, diarrhea, constipation, flank pain, dysuria, hematuria, urinary  Frequency, nocturia, numbness, tingling, seizures,  Focal weakness, Loss of consciousness,  Tremor, insomnia, depression, anxiety, and suicidal ideation.      Objective:  BP 114/70 (BP Location: Left Arm, Patient Position: Sitting, Cuff Size: Large)   Pulse (!) 103   Temp (!) 96.6 F (35.9 C) (Temporal)   Resp 15   Ht 5\' 7"  (1.702 m)   Wt  216 lb 12.8 oz (98.3 kg)   SpO2 96%   BMI 33.96 kg/m   BP Readings from Last 3 Encounters:  08/23/20 114/70  05/24/20 118/66  05/22/19 120/82    Wt Readings from Last 3 Encounters:  08/23/20 216 lb 12.8 oz (98.3 kg)  05/24/20 250 lb 3.2 oz (113.5 kg)  12/09/19 225 lb (102.1 kg)    General appearance: alert, cooperative and appears stated age Ears: normal TM's and external ear canals both ears Throat: lips, mucosa, and tongue normal; teeth and gums normal Neck: no adenopathy, no carotid bruit, supple, symmetrical, trachea midline and thyroid not enlarged, symmetric, no tenderness/mass/nodules Back: symmetric, no curvature. ROM normal. No CVA tenderness. Lungs: clear to auscultation bilaterally Heart: regular rate and  rhythm, S1, S2 normal, no murmur, click, rub or gallop Abdomen: soft, non-tender; bowel sounds normal; no masses,  no organomegaly Pulses: 2+ and symmetric Skin: Skin color, texture, turgor normal. No rashes or lesions Lymph nodes: Cervical, supraclavicular, and axillary nodes normal.  Lab Results  Component Value Date   HGBA1C 5.2 05/24/2020   HGBA1C 5.1 12/15/2019   HGBA1C 4.9 05/22/2019    Lab Results  Component Value Date   CREATININE 0.57 05/24/2020   CREATININE 0.64 12/15/2019   CREATININE 0.68 05/22/2019    Lab Results  Component Value Date   WBC 5.9 05/24/2020   HGB 13.8 05/24/2020   HCT 40.4 05/24/2020   PLT 262.0 05/24/2020   GLUCOSE 91 05/24/2020   CHOL 202 (H) 05/24/2020   TRIG 179.0 (H) 05/24/2020   HDL 45.20 05/24/2020   LDLCALC 121 (H) 05/24/2020   ALT 12 05/24/2020   AST 15 05/24/2020   NA 143 05/24/2020   K 3.5 05/24/2020   CL 106 05/24/2020   CREATININE 0.57 05/24/2020   BUN 7 05/24/2020   CO2 29 05/24/2020   TSH 1.53 05/24/2020   HGBA1C 5.2 05/24/2020    No results found.  Assessment & Plan:   Problem List Items Addressed This Visit      Unprioritized   Generalized anxiety disorder    Did not tolerate trial of  buspirone 30 mg bid. Did not tolerate trial of temazepam for insomnia . Using alprazolam .  History of cloonazepam use.       Relevant Medications   mirtazapine (REMERON) 7.5 MG tablet   Major depressive disorder, recurrent ([place]) - Primary    Does not  want to try SSRI therapy.  Trial of mirtazipine given anorexia (severe) , insomnia and fatigue .  Referral to psychiatry       Relevant Medications   mirtazapine (REMERON) 7.5 MG tablet   Other Relevant Orders   Comprehensive metabolic panel   Urinalysis, Routine w reflex microscopic   Vitamin B12   CBC with Differential/Platelet   Ambulatory referral to Psychiatry   Obesity    She has lost 35 lbs on Ozempic but is consuming less than 500 calories most days. Advice given about diet .advised to stay on current dose until weight plateaus       Other Visit Diagnoses    Fatigue, unspecified type          I have discontinued Joelene Millin A. Gorin "[name]"'s Semaglutide-Weight Management. I am also having her start on mirtazapine. Additionally, I am having her maintain her tamsulosin, Multiple Vitamins-Minerals (CENTRUM WOMEN PO), lidocaine-prilocaine, propranolol, cyclobenzaprine, ondansetron, traMADol, montelukast, omeprazole, Vitamin D (Ergocalciferol), Clindamycin-Benzoyl Per (Refr), azelastine, mometasone, bimatoprost, ALPRAZolam, albuterol, SUMAtriptan, cyanocobalamin, desloratadine, fluticasone, diptheria-tetanus toxoids, propranolol ER, Wegovy, and gabapentin.  Meds ordered this encounter  Medications  . mirtazapine (REMERON) 7.5 MG tablet    Sig: Take 1 tablet (7.5 mg total) by mouth at bedtime.    Dispense:  90 tablet    Refill:  1   A total of 40 minutes was spent with patient more than half of which was spent in counseling patient on the  Her restricted diet,  Current untreated depression , reviewing and explaining recent labs and imaging studies done, and coordination of care.  Medications Discontinued During This Encounter   Medication Reason  . cyclobenzaprine (FLEXERIL) 10 MG tablet   . gabapentin (NEURONTIN) 625 MG capsule Duplicate  . Semaglutide-Weight Management 0.5 MG/0.5ML SOAJ   . Semaglutide-Weight Management (WEGOVY) 1 MG/0.5ML SOAJ   .  ondansetron (ZOFRAN) 8 MG tablet     Follow-up: No follow-ups on file.   Crecencio Mc, MD

## 2020-08-24 LAB — COMPREHENSIVE METABOLIC PANEL
ALT: 68 IU/L — ABNORMAL HIGH (ref 0–32)
AST: 42 IU/L — ABNORMAL HIGH (ref 0–40)
Albumin/Globulin Ratio: 1.6 (ref 1.2–2.2)
Albumin: 4.4 g/dL (ref 3.8–4.8)
Alkaline Phosphatase: 137 IU/L — ABNORMAL HIGH (ref 44–121)
BUN/Creatinine Ratio: 7 — ABNORMAL LOW (ref 9–23)
BUN: 5 mg/dL — ABNORMAL LOW (ref 6–24)
Bilirubin Total: 0.5 mg/dL (ref 0.0–1.2)
CO2: 22 mmol/L (ref 20–29)
Calcium: 9.6 mg/dL (ref 8.7–10.2)
Chloride: 101 mmol/L (ref 96–106)
Creatinine, Ser: 0.69 mg/dL (ref 0.57–1.00)
Globulin, Total: 2.7 g/dL (ref 1.5–4.5)
Glucose: 87 mg/dL (ref 65–99)
Potassium: 3.5 mmol/L (ref 3.5–5.2)
Sodium: 142 mmol/L (ref 134–144)
Total Protein: 7.1 g/dL (ref 6.0–8.5)
eGFR: 112 mL/min/{1.73_m2} (ref >59)

## 2020-08-24 LAB — CBC WITH DIFFERENTIAL/PLATELET
Basophils Absolute: 0.1 10*3/uL (ref 0.0–0.2)
Basos: 1 %
EOS (ABSOLUTE): 0.1 10*3/uL (ref 0.0–0.4)
Eos: 2 %
Hematocrit: 43 % (ref 34.0–46.6)
Hemoglobin: 14.4 g/dL (ref 11.1–15.9)
Immature Grans (Abs): 0 10*3/uL (ref 0.0–0.1)
Immature Granulocytes: 0 %
Lymphocytes Absolute: 1.5 10*3/uL (ref 0.7–3.1)
Lymphs: 23 %
MCH: 29.3 pg (ref 26.6–33.0)
MCHC: 33.5 g/dL (ref 31.5–35.7)
MCV: 88 fL (ref 79–97)
Monocytes Absolute: 0.5 10*3/uL (ref 0.1–0.9)
Monocytes: 7 %
Neutrophils Absolute: 4.2 10*3/uL (ref 1.4–7.0)
Neutrophils: 67 %
Platelets: 321 10*3/uL (ref 150–450)
RBC: 4.91 x10E6/uL (ref 3.77–5.28)
RDW: 14.2 % (ref 11.7–15.4)
WBC: 6.3 10*3/uL (ref 3.4–10.8)

## 2020-08-24 LAB — VITAMIN B12: Vitamin B-12: 721 pg/mL (ref 232–1245)

## 2020-08-24 NOTE — Addendum Note (Signed)
Addended by: Crecencio Mc on: 08/24/2020 09:56 PM   Modules accepted: Orders

## 2020-08-30 ENCOUNTER — Other Ambulatory Visit: Payer: Self-pay

## 2020-08-31 DIAGNOSIS — Z0279 Encounter for issue of other medical certificate: Secondary | ICD-10-CM

## 2020-09-01 ENCOUNTER — Telehealth (INDEPENDENT_AMBULATORY_CARE_PROVIDER_SITE_OTHER): Payer: 59 | Admitting: Internal Medicine

## 2020-09-01 ENCOUNTER — Encounter: Payer: Self-pay | Admitting: Internal Medicine

## 2020-09-01 VITALS — Ht 67.0 in | Wt 207.0 lb

## 2020-09-01 DIAGNOSIS — R748 Abnormal levels of other serum enzymes: Secondary | ICD-10-CM

## 2020-09-01 DIAGNOSIS — R110 Nausea: Secondary | ICD-10-CM

## 2020-09-01 DIAGNOSIS — R109 Unspecified abdominal pain: Secondary | ICD-10-CM | POA: Diagnosis not present

## 2020-09-01 DIAGNOSIS — F419 Anxiety disorder, unspecified: Secondary | ICD-10-CM | POA: Diagnosis not present

## 2020-09-01 DIAGNOSIS — R112 Nausea with vomiting, unspecified: Secondary | ICD-10-CM

## 2020-09-01 MED ORDER — ONDANSETRON 8 MG PO TBDP
8.0000 mg | ORAL_TABLET | Freq: Three times a day (TID) | ORAL | 2 refills | Status: DC | PRN
  Filled 2020-10-11: qty 40, 14d supply, fill #0
  Filled 2021-02-01: qty 40, 14d supply, fill #1

## 2020-09-01 MED ORDER — DICYCLOMINE HCL 10 MG PO CAPS: 10.0000 mg | ORAL_CAPSULE | Freq: Three times a day (TID) | ORAL | 0 refills | Status: DC

## 2020-09-01 NOTE — Progress Notes (Addendum)
Virtual Visit via Video Note  I connected Tammy Christian  on 09/01/20 at  4:10PM EDT by a video enabled telemedicine application and verified that I am speaking with the correct person using two identifiers.  Location patient: home, [place] Location provider:work or home office Persons participating in the virtual visit: patient, provider  I discussed the limitations of evaluation and management by telemedicine and the availability of in person appointments. The patient expressed understanding and agreed to proceed.   HPI:  Acute telemedicine visit for : -intractable n/v reduced po, ab spasm, elevated lfts since 08/23/20 was on wegovy 1.7 weekly x 3 months and not taken in 2 weeks due to n/v and throwing up pills too. Saw PCP 08/23/20 but not able to go to work all day. She is having epigastric pain as well mild po intake today slightly better and had more fluids and urinating more. 2 years ago she had reglan for n/v sx's. Zofran orally not able to keep down and tried protein shakes. She is having stools QOD and gas daily.  Established with [facility] but will repeat labs for now with elevated lfts  She reports was on saxenda 2 years ago for 1-1.5 years and each time on GLP 1 meds lfts go up   She has been able to lose ~45 lbs for now but wants to stop wegovy for now   -COVID-19 vaccine status: 3/3  ROS: See pertinent positives and negatives per HPI.  Past Medical History:  Diagnosis Date   Abnormal Pap smear of cervix 2012   Abortion 2001   Allergy    Anxiety    panic disorder   Arthritis    Asthma    Gastritis 2005   Hyperlipidemia    Kidney stone    multiple   Migraines    Obesity    Panic disorder    Vitamin D deficiency disease     Past Surgical History:  Procedure Laterality Date   BREAST BIOPSY Right 02/04/2001   Negative c Clip   CHOLECYSTECTOMY  04/08/2012   COLONOSCOPY WITH PROPOFOL N/A 07/12/2017   Procedure: COLONOSCOPY WITH PROPOFOL;  Surgeon: Lin Landsman,  MD;  Location: [place];  Service: Gastroenterology;  Laterality: N/A;   COLPOSCOPY  2012   ESOPHAGOGASTRODUODENOSCOPY (EGD) WITH PROPOFOL N/A 07/12/2017   Procedure: ESOPHAGOGASTRODUODENOSCOPY (EGD) WITH PROPOFOL;  Surgeon: Lin Landsman, MD;  Location: [place];  Service: Gastroenterology;  Laterality: N/A;   KNEE ARTHROSCOPY Left 05/01/2016   Procedure: ARTHROSCOPY KNEE LATERAL RELEASE;  Surgeon: Hessie Knows, MD;  Location: [facility];  Service: Orthopedics;  Laterality: Left;   laprascopic sleeve  04/08/2012   GASTRIC   SPINAL FUSION  09/25/2016     Current Outpatient Medications:    ALPRAZolam (XANAX) 1 MG tablet, Take 1.5 tablets (1.5 mg total) by mouth at bedtime as needed., Disp: 45 tablet, Rfl: 2   bimatoprost (LATISSE) 0.03 % ophthalmic solution, APPLY 1 DROP TO UPPER EYE LID AT BEDTIME AS DIRECTED, Disp: 5 mL, Rfl: 6   Clindamycin-Benzoyl Per, Refr, gel, APPLY TO THE AFFECTED AREA(S) DAILY, Disp: 45 g, Rfl: 11   cyanocobalamin (,VITAMIN B-12,) 1000 MCG/ML injection, INJECT 1 MILLILITER (ML) INTO THE MUSCLE EVERY THIRTY DAYS, Disp: 3 mL, Rfl: 2   cyclobenzaprine (FLEXERIL) 10 MG tablet, TAKE 1 TABLET BY MOUTH 3 TIMES DAILY., Disp: 90 tablet, Rfl: 1   desloratadine (CLARINEX) 5 MG tablet, TAKE 1 TABLET (5 MG TOTAL) BY MOUTH DAILY., Disp: 90 tablet, Rfl: 3  dicyclomine (BENTYL) 10 MG capsule, Take 1 capsule (10 mg total) by mouth 4 (four) times daily -  before meals and at bedtime., Disp: 120 capsule, Rfl: 0   fluticasone (FLONASE) 50 MCG/ACT nasal spray, Place 2 sprays into both nostrils daily., Disp: 16 g, Rfl: 5   gabapentin (NEURONTIN) 300 MG capsule, TAKE 1 CAPSULE (300 MG TOTAL) BY MOUTH 3 (THREE) TIMES DAILY., Disp: 270 capsule, Rfl: 1   mometasone (NASONEX) 50 MCG/ACT nasal spray, PLACE 2 SPRAYS INTO THE NOSE DAILY., Disp: 17 g, Rfl: 9   montelukast (SINGULAIR) 10 MG tablet, TAKE 1 TABLET BY MOUTH AT BEDTIME., Disp: 90 tablet, Rfl: 3   Multiple  Vitamins-Minerals (CENTRUM WOMEN PO), , Disp: , Rfl:    omeprazole (PRILOSEC) 40 MG capsule, TAKE 1 CAPSULE BY MOUTH 2 TIMES DAILY BEFORE A MEAL., Disp: 180 capsule, Rfl: 3   ondansetron (ZOFRAN ODT) 8 MG disintegrating tablet, Take 1 tablet (8 mg total) by mouth every 8 (eight) hours as needed for nausea or vomiting., Disp: 40 tablet, Rfl: 2   propranolol (INDERAL) 10 MG tablet, Take 10 mg by mouth 3 (three) times daily., Disp: , Rfl:    propranolol ER (INDERAL LA) 120 MG 24 hr capsule, TAKE 1 CAPSULE BY MOUTH DAILY., Disp: 90 capsule, Rfl: 3   Semaglutide-Weight Management (WEGOVY) 1.7 MG/0.75ML SOAJ, Inject 1.7 mg into the skin once a week., Disp: 3 mL, Rfl: 1   SUMAtriptan (IMITREX) 100 MG tablet, TAKE 1 TABLET BY MOUTH DAILY AS NEEDED., Disp: 9 tablet, Rfl: 3   traMADol (ULTRAM) 50 MG tablet, TAKE 1 TABLET (50 MG TOTAL) BY MOUTH EVERY 6 (SIX) HOURS AS NEEDED., Disp: 90 tablet, Rfl: 5   Vitamin D, Ergocalciferol, (DRISDOL) 1.25 MG (50000 UNIT) CAPS capsule, TAKE 1 CAPSULE BY MOUTH EVERY 7 DAYS ON SUNDAYS, Disp: 12 capsule, Rfl: 3   albuterol (VENTOLIN HFA) 108 (90 Base) MCG/ACT inhaler, Inhale 2 puffs into the lungs every 4 (four) hours as needed for wheezing or shortness of breath. (Patient not taking: Reported on 09/01/2020), Disp: 18 g, Rfl: 5   azelastine (ASTELIN) 0.1 % nasal spray, PLACE 2 SPRAYS INTO BOTH NOSTRILS 2 TIMES DAILY AS DIRECTED (Patient not taking: Reported on 09/01/2020), Disp: 30 mL, Rfl: 9   diptheria-tetanus toxoids (TDVAX) 2-2 LF/0.5ML injection, Inject into the muscle. (Patient not taking: Reported on 09/01/2020), Disp: 0.5 mL, Rfl: 0   lidocaine-prilocaine (EMLA) cream, APPLY TOPICALLY AS NEEDED. (Patient not taking: Reported on 09/01/2020), Disp: 30 g, Rfl: 3   mirtazapine (REMERON) 7.5 MG tablet, Take 1 tablet (7.5 mg total) by mouth at bedtime. (Patient not taking: Reported on 09/01/2020), Disp: 90 tablet, Rfl: 1   tamsulosin (FLOMAX) 0.4 MG CAPS capsule, Take 1 capsule (0.4  mg total) by mouth as needed (for kidney stone). (Patient not taking: Reported on 09/01/2020), Disp: 30 capsule, Rfl: 6  EXAM:  VITALS per patient if applicable:  GENERAL: alert, oriented, appears ill/tired but in no acute distress  HEENT: atraumatic, conjunttiva clear, no obvious abnormalities on inspection of external nose and ears  NECK: normal movements of the head and neck  LUNGS: on inspection no signs of respiratory distress, breathing rate appears normal, no obvious gross SOB, gasping or wheezing  CV: no obvious cyanosis  MS: moves all visible extremities without noticeable abnormality  PSYCH/NEURO: pleasant and cooperative, no obvious depression or anxiety, speech and thought processing grossly intact  ASSESSMENT AND PLAN:  Discussed the following assessment and plan:  Intractable nausea and vomiting ?intolerance  to wegovy 1.7 weekly- Plan: ondansetron (ZOFRAN ODT) 8 MG disintegrating tablet tid prn  Letter for work my chart  Stop wegovy  Anxiety Takes prn xanax but has been unable to keep this down   Elevated liver enzymes Results for Tammy Christian, Tammy Christian (MRN [id number]) as of 09/01/2020 17:07  Ref. Range 12/15/2019 09:59 01/27/2020 12:00 05/24/2020 09:25 08/23/2020 09:56  Alkaline Phosphatase Latest Ref Range: 44 - 121 IU/L 72 77 71 137 (H)  Albumin Latest Ref Range: 3.8 - 4.8 g/dL 3.8 4.2 3.8 4.4  Albumin/Globulin Ratio Latest Ref Range: 1.2 - 2.2     1.6  AST Latest Ref Range: 0 - 40 IU/L 68 (H) 25 15 42 (H)  ALT Latest Ref Range: 0 - 32 IU/L 119 (H) 33 12 68 (H)  Total Protein Latest Ref Range: 6.0 - 8.5 g/dL 6.5 7.5 6.2 7.1  Bilirubin, Direct Latest Ref Range: 0.0 - 0.2 mg/dL  <0.1    Indirect Bilirubin Latest Ref Range: 0.3 - 0.9 mg/dL  NOT CALCULATED    Total Bilirubin Latest Ref Range: 0.0 - 1.2 mg/dL 0.6 0.8 0.3 0.5  GFR Latest Ref Range: >60.00 mL/min   112.97   GFR, Est Non African American Latest Ref Range: >60 mL/min >60     GFR, Est African American  Latest Ref Range: >60 mL/min >60       Wants repeat cmet at [facility] 09/06/20 will need to be cc'ed to PCP  [facility] and if lfts cont to be elevated will need further w/u with labs, US abdomen, GI f/u   Abdominal cramps - Plan: dicyclomine (BENTYL) 10 MG capsule qid  Abdominal spasms - Plan: dicyclomine (BENTYL) 10 MG capsule  -we discussed possible serious and likely etiologies, options for evaluation and workup, limitations of telemedicine visit vs in person visit, treatment, treatment risks and precautions. Pt prefers to treat via telemedicine empirically rather than in person at this moment.     Discussed options for inperson care if PCP office not available. Did let this patient know that I only do telemedicine on Tuesdays and Thursdays for [place]. Advised to schedule follow up visit with PCP or [facility] if any further questions or concerns to avoid delays in care.   I discussed the assessment and treatment plan with the patient. The patient was provided an opportunity to ask questions and all were answered. The patient agreed with the plan and demonstrated an understanding of the instructions.    Time 20 minutes Delorise Jackson, MD

## 2020-09-01 NOTE — Progress Notes (Signed)
Gastric spasms started Monday with nausea and vomiting. The spasms started last Wednesday night

## 2020-09-02 ENCOUNTER — Telehealth: Payer: Self-pay

## 2020-09-02 NOTE — Telephone Encounter (Signed)
LVM for pt to Return in about 4 weeks (around 09/29/2020) for with PCP in person

## 2020-09-06 ENCOUNTER — Other Ambulatory Visit: Payer: Self-pay

## 2020-09-06 ENCOUNTER — Telehealth: Payer: Self-pay | Admitting: Internal Medicine

## 2020-09-06 NOTE — Telephone Encounter (Signed)
Left message to return call. Received a request to fill out FMLA paper work. This is in Patient's chart completed 05/2020. Needing to know if this needs to be completed again and if so why?

## 2020-09-06 NOTE — Telephone Encounter (Signed)
Left message to return call. Seems as though FMLA completed for 05/31/20 was for diagnosis of diarrhea. Needing to know what new condition she needs current paperwork for.

## 2020-09-06 NOTE — Telephone Encounter (Signed)
Patient was seen by Dr Olivia Mackie McLean-Scocuzza via video for nausea, abdominal pain, and anxiety.

## 2020-09-06 NOTE — Telephone Encounter (Signed)
Patient states this new paperwork is for nausea and vomiting due to a medication. States she saw Dr Olivia Mackie McLean-Scocuzza for this however she is out of office.   Please advise

## 2020-09-06 NOTE — Telephone Encounter (Signed)
Pt called back returning your call °

## 2020-09-06 NOTE — Telephone Encounter (Signed)
Pt states that this is for a completely different issue and states that it should be able to be seen in her chart. She has seen Dr Derrel Nip and Dr [facility] as well. Pt would like a call back to clarify

## 2020-09-07 NOTE — Telephone Encounter (Signed)
Please advise 

## 2020-09-07 NOTE — Telephone Encounter (Signed)
This was completed and faxed to [employer] last week.

## 2020-09-08 ENCOUNTER — Other Ambulatory Visit: Payer: Self-pay | Admitting: Internal Medicine

## 2020-09-09 ENCOUNTER — Other Ambulatory Visit: Payer: Self-pay

## 2020-09-09 MED ORDER — CYCLOBENZAPRINE HCL 10 MG PO TABS: 10.0000 mg | ORAL_TABLET | Freq: Three times a day (TID) | ORAL | 6 refills | Status: DC

## 2020-09-09 MED ORDER — TRAMADOL HCL 50 MG PO TABS
ORAL_TABLET | Freq: Four times a day (QID) | ORAL | 5 refills | Status: DC | PRN
  Filled 2020-09-09: qty 90, 23d supply, fill #0
  Filled 2020-10-06: qty 90, 23d supply, fill #1
  Filled 2020-11-02: qty 90, 23d supply, fill #2
  Filled 2020-11-29: qty 90, 23d supply, fill #3
  Filled 2020-12-24: qty 90, 23d supply, fill #4
  Filled 2021-01-14: qty 90, 23d supply, fill #5

## 2020-09-09 MED ORDER — ALPRAZOLAM 1 MG PO TABS
1.5000 mg | ORAL_TABLET | Freq: Every evening | ORAL | 5 refills | Status: DC | PRN
  Filled 2020-09-09 – 2020-09-14 (×3): qty 45, 30d supply, fill #0
  Filled 2020-10-12: qty 45, 30d supply, fill #1
  Filled 2020-11-09: qty 45, 30d supply, fill #2
  Filled 2020-12-05: qty 45, 30d supply, fill #3
  Filled 2021-01-06: qty 45, 30d supply, fill #4
  Filled 2021-02-01: qty 45, 30d supply, fill #5

## 2020-09-09 MED FILL — Cyclobenzaprine HCl Tab 10 MG: ORAL | 30 days supply | Qty: 90 | Fill #0 | Status: AC

## 2020-09-10 ENCOUNTER — Other Ambulatory Visit: Payer: Self-pay

## 2020-09-10 MED FILL — Clindamycin Phosph-Benzoyl Peroxide (Refrig) Gel 1.2 (1)-5%: CUTANEOUS | 30 days supply | Qty: 45 | Fill #1 | Status: AC

## 2020-09-14 ENCOUNTER — Other Ambulatory Visit: Payer: Self-pay

## 2020-09-28 ENCOUNTER — Other Ambulatory Visit: Payer: Self-pay | Admitting: Internal Medicine

## 2020-09-28 DIAGNOSIS — R109 Unspecified abdominal pain: Secondary | ICD-10-CM

## 2020-10-06 ENCOUNTER — Other Ambulatory Visit: Payer: Self-pay

## 2020-10-07 ENCOUNTER — Other Ambulatory Visit: Payer: Self-pay

## 2020-10-07 MED ORDER — CYCLOBENZAPRINE HCL 10 MG PO TABS
10.0000 mg | ORAL_TABLET | Freq: Three times a day (TID) | ORAL | 5 refills | Status: DC | PRN
  Filled 2020-10-07: qty 90, 30d supply, fill #0
  Filled 2020-11-09: qty 90, 30d supply, fill #1
  Filled 2020-12-06: qty 90, 30d supply, fill #2
  Filled 2021-02-01: qty 90, 30d supply, fill #3
  Filled 2021-03-04: qty 90, 30d supply, fill #4
  Filled 2021-05-03 – 2021-05-06 (×2): qty 90, 30d supply, fill #5

## 2020-10-09 ENCOUNTER — Other Ambulatory Visit (INDEPENDENT_AMBULATORY_CARE_PROVIDER_SITE_OTHER): Payer: Self-pay | Admitting: Vascular Surgery

## 2020-10-11 ENCOUNTER — Other Ambulatory Visit: Payer: Self-pay

## 2020-10-11 MED ORDER — KETOROLAC TROMETHAMINE 10 MG PO TABS
ORAL_TABLET | Freq: Four times a day (QID) | ORAL | 5 refills | Status: DC | PRN
  Filled 2020-10-11: qty 20, 5d supply, fill #0

## 2020-10-12 ENCOUNTER — Other Ambulatory Visit: Payer: Self-pay

## 2020-10-12 ENCOUNTER — Other Ambulatory Visit: Payer: Self-pay | Admitting: Internal Medicine

## 2020-10-12 MED FILL — Ondansetron HCl Tab 8 MG: ORAL | 30 days supply | Qty: 90 | Fill #0 | Status: AC

## 2020-10-12 MED FILL — Clindamycin Phosph-Benzoyl Peroxide (Refrig) Gel 1.2 (1)-5%: CUTANEOUS | 30 days supply | Qty: 45 | Fill #2 | Status: AC

## 2020-10-12 NOTE — Telephone Encounter (Signed)
Refilled: 09/08/2019 Last OV: 08/23/2020 Next OV: 10/18/2020  Historical Medication

## 2020-10-13 ENCOUNTER — Other Ambulatory Visit: Payer: Self-pay

## 2020-10-13 MED ORDER — PROPRANOLOL HCL 10 MG PO TABS
10.0000 mg | ORAL_TABLET | Freq: Three times a day (TID) | ORAL | 5 refills | Status: DC
  Filled 2020-10-13: qty 90, 30d supply, fill #0
  Filled 2020-11-09: qty 90, 30d supply, fill #1
  Filled 2021-02-01: qty 90, 30d supply, fill #2

## 2020-10-14 ENCOUNTER — Other Ambulatory Visit: Payer: Self-pay

## 2020-10-18 ENCOUNTER — Other Ambulatory Visit: Payer: Self-pay

## 2020-10-18 ENCOUNTER — Encounter: Payer: Self-pay | Admitting: Internal Medicine

## 2020-10-18 ENCOUNTER — Ambulatory Visit: Payer: 59 | Admitting: Internal Medicine

## 2020-10-18 DIAGNOSIS — F411 Generalized anxiety disorder: Secondary | ICD-10-CM | POA: Diagnosis not present

## 2020-10-18 DIAGNOSIS — Z6841 Body Mass Index (BMI) 40.0 and over, adult: Secondary | ICD-10-CM

## 2020-10-18 DIAGNOSIS — M5387 Other specified dorsopathies, lumbosacral region: Secondary | ICD-10-CM

## 2020-10-18 MED ORDER — ESCITALOPRAM OXALATE 10 MG PO TABS
10.0000 mg | ORAL_TABLET | Freq: Every day | ORAL | 5 refills | Status: DC
  Filled 2020-10-18: qty 30, 30d supply, fill #0
  Filled 2020-11-16: qty 30, 30d supply, fill #1
  Filled 2020-12-19 – 2020-12-20 (×2): qty 30, 30d supply, fill #2
  Filled 2021-01-16: qty 30, 30d supply, fill #3

## 2020-10-18 MED ORDER — OXYCODONE HCL 5 MG PO TABS: 5.0000 mg | ORAL_TABLET | Freq: Four times a day (QID) | ORAL | 0 refills | Status: DC | PRN

## 2020-10-18 MED ORDER — SEMAGLUTIDE-WEIGHT MANAGEMENT 0.5 MG/0.5ML ~~LOC~~ SOAJ
0.5000 mg | SUBCUTANEOUS | 0 refills | Status: DC
  Filled 2020-10-18: qty 2, 28d supply, fill #0

## 2020-10-18 MED ORDER — OXYCODONE HCL 5 MG PO TABS
5.0000 mg | ORAL_TABLET | Freq: Four times a day (QID) | ORAL | 0 refills | Status: DC | PRN
  Filled 2020-10-18: qty 30, 8d supply, fill #0

## 2020-10-18 NOTE — Patient Instructions (Signed)
Resume Wegovy starting at 0.5 mg dose for the first month  CAll for refills, and specify if dose needs to be increased   DO NOT increase alprazolam beyond 1 mg daily   Start lexapro fat 1/2 tablet daily WITH A MEAL to avoid nausea   One time oxycodone refill sent #[phone] for SEVERE PAIN

## 2020-10-18 NOTE — Progress Notes (Signed)
Subjective:  Patient ID: Tammy Christian, female    DOB: [birth year]  Age: 40 y.o. MRN: [id number]  CC: Diagnoses of Generalized anxiety disorder, Class 3 severe obesity due to excess calories without serious comorbidity with body mass index (BMI) of 40.0 to 44.9 in adult [facility]), and Sciatica of left side associated with disorder of lumbosacral spine were pertinent to this visit.  HPI Tammy Christian presents for follow up on GAD   chronic pain and obesity .  This visit occurred during the SARS-CoV-2 public health emergency.  Safety protocols were in place, including screening questions prior to the visit, additional usage of staff PPE, and extensive cleaning of exam room while observing appropriate contact time as indicated for disinfecting solutions.    Chronic low back pain:  march 2022  xrays done due to prior fusion:  Rad report:  posterior spinal fusion at the L4-S1 levels. L4-S1. interbody spacers. Superior displacement of the left fixation rod which only articulates with the left L4 transpedicular screw is unchanged in positioning. No fracture or focal osseous lesion  GAD:  referred to psychiatry 3 months ago,  last week the psych office called her and stated that they would not prescribe alprazolam.  refused to see her unless she weaned herself off of alprazolam.  Not sure who she was referred to.  Taking 1.5 mg alprazoalm  wants to increase to 2 mg daily.  I have suggested that we try adding lexapro.   Obesity : did not tolerate 1.7 mg wegovy due to gastroparesis,  had spasms of abd pain , intractable nausea and vomiting  which resolved with bentyl  wants to resume wegovy at 1 mg . Has been off of the medication for one month.  Plan: to start at lower dose   Wants a 30 day rx for oxycodone to last one year .  Using tramadol 3 times daily at most   Migraine disorder : frequency varies,  start at work     Outpatient Medications Prior to Visit  Medication Sig Dispense Refill    albuterol (VENTOLIN HFA) 108 (90 Base) MCG/ACT inhaler Inhale 2 puffs into the lungs every 4 (four) hours as needed for wheezing or shortness of breath. 18 g 5   ALPRAZolam (XANAX) 1 MG tablet Take 1.5 tablets (1.5 mg total) by mouth at bedtime as needed. 45 tablet 5   azelastine (ASTELIN) 0.1 % nasal spray PLACE 2 SPRAYS INTO BOTH NOSTRILS 2 TIMES DAILY AS DIRECTED 30 mL 9   bimatoprost (LATISSE) 0.03 % ophthalmic solution APPLY 1 DROP TO UPPER EYE LID AT BEDTIME AS DIRECTED 5 mL 6   Clindamycin-Benzoyl Per, Refr, gel APPLY TO THE AFFECTED AREA(S) DAILY 45 g 11   cyanocobalamin (,VITAMIN B-12,) 1000 MCG/ML injection INJECT 1 MILLILITER (ML) INTO THE MUSCLE EVERY THIRTY DAYS 3 mL 2   cyclobenzaprine (FLEXERIL) 10 MG tablet Take 1 tablet (10 mg total) by mouth every 8 (eight) hours as needed for spasms 90 tablet 5   desloratadine (CLARINEX) 5 MG tablet TAKE 1 TABLET (5 MG TOTAL) BY MOUTH DAILY. 90 tablet 3   dicyclomine (BENTYL) 10 MG capsule TAKE 1 CAPSULE (10 MG TOTAL) BY MOUTH 4 (FOUR) TIMES DAILY - BEFORE MEALS AND AT BEDTIME. 120 capsule 0   diptheria-tetanus toxoids (TDVAX) 2-2 LF/0.5ML injection Inject into the muscle. 0.5 mL 0   fluticasone (FLONASE) 50 MCG/ACT nasal spray Place 2 sprays into both nostrils daily. 16 g 5   gabapentin (NEURONTIN) 300 MG capsule  TAKE 1 CAPSULE (300 MG TOTAL) BY MOUTH 3 (THREE) TIMES DAILY. 270 capsule 1   lidocaine-prilocaine (EMLA) cream APPLY TOPICALLY AS NEEDED. 30 g 3   mometasone (NASONEX) 50 MCG/ACT nasal spray PLACE 2 SPRAYS INTO THE NOSE DAILY. 17 g 9   montelukast (SINGULAIR) 10 MG tablet TAKE 1 TABLET BY MOUTH AT BEDTIME. 90 tablet 3   Multiple Vitamins-Minerals (CENTRUM WOMEN PO)      omeprazole (PRILOSEC) 40 MG capsule TAKE 1 CAPSULE BY MOUTH 2 TIMES DAILY BEFORE A MEAL. 180 capsule 3   ondansetron (ZOFRAN ODT) 8 MG disintegrating tablet Take 1 tablet (8 mg total) by mouth every 8 (eight) hours as needed for nausea or vomiting. 40 tablet 2    ondansetron (ZOFRAN) 8 MG tablet TAKE 1 TABLET BY MOUTH EVERY 8 HOURS AS NEEDED 90 tablet 4   propranolol (INDERAL) 10 MG tablet Take 1 tablet (10 mg total) by mouth 3 (three) times daily. 90 tablet 5   propranolol ER (INDERAL LA) 120 MG 24 hr capsule TAKE 1 CAPSULE BY MOUTH DAILY. 90 capsule 3   SUMAtriptan (IMITREX) 100 MG tablet TAKE 1 TABLET BY MOUTH DAILY AS NEEDED. 9 tablet 3   tamsulosin (FLOMAX) 0.4 MG CAPS capsule Take 1 capsule (0.4 mg total) by mouth as needed (for kidney stone). 30 capsule 6   traMADol (ULTRAM) 50 MG tablet TAKE 1 TABLET (50 MG TOTAL) BY MOUTH EVERY 6 (SIX) HOURS AS NEEDED. 90 tablet 5   Vitamin D, Ergocalciferol, (DRISDOL) 1.25 MG (50000 UNIT) CAPS capsule TAKE 1 CAPSULE BY MOUTH EVERY 7 DAYS ON SUNDAYS 12 capsule 3   Semaglutide-Weight Management (WEGOVY) 1.7 MG/0.75ML SOAJ Inject 1.7 mg into the skin once a week. 3 mL 1   cyclobenzaprine (FLEXERIL) 10 MG tablet TAKE 1 TABLET BY MOUTH 3 TIMES DAILY. 90 tablet 1   cyclobenzaprine (FLEXERIL) 10 MG tablet Take 1 tablet (10 mg total) by mouth 3 (three) times daily. 90 tablet 6   ketorolac (TORADOL) 10 MG tablet TAKE 1 TABLET BY MOUTH EVERY 6 HOURS AS NEEDED FOR PAIN 30 tablet 5   mirtazapine (REMERON) 7.5 MG tablet Take 1 tablet (7.5 mg total) by mouth at bedtime. (Patient not taking: Reported on 09/01/2020) 90 tablet 1   No facility-administered medications prior to visit.    Review of Systems;  Patient denies headache, fevers, malaise, unintentional weight loss, skin rash, eye pain, sinus congestion and sinus pain, sore throat, dysphagia,  hemoptysis , cough, dyspnea, wheezing, chest pain, palpitations, orthopnea, edema, abdominal pain, nausea, melena, diarrhea, constipation, flank pain, dysuria, hematuria, urinary  Frequency, nocturia, numbness, tingling, seizures,  Focal weakness, Loss of consciousness,  Tremor, insomnia, depression, anxiety, and suicidal ideation.      Objective:  BP 110/64 (BP Location: Left  Arm, Patient Position: Sitting, Cuff Size: Normal)   Pulse 82   Temp (!) 95.6 F (35.3 C) (Temporal)   Resp 15   Ht '5\' 7"'$  (1.702 m)   Wt 222 lb (100.7 kg)   SpO2 98%   BMI 34.77 kg/m   BP Readings from Last 3 Encounters:  10/18/20 110/64  08/23/20 114/70  05/24/20 118/66    Wt Readings from Last 3 Encounters:  10/18/20 222 lb (100.7 kg)  09/01/20 207 lb (93.9 kg)  08/23/20 216 lb 12.8 oz (98.3 kg)    General appearance: alert, cooperative and appears stated age Ears: normal TM's and external ear canals both ears Throat: lips, mucosa, and tongue normal; teeth and gums normal Neck: no  adenopathy, no carotid bruit, supple, symmetrical, trachea midline and thyroid not enlarged, symmetric, no tenderness/mass/nodules Back: symmetric, no curvature. ROM normal. No CVA tenderness. Lungs: clear to auscultation bilaterally Heart: regular rate and rhythm, S1, S2 normal, no murmur, click, rub or gallop Abdomen: soft, non-tender; bowel sounds normal; no masses,  no organomegaly Pulses: 2+ and symmetric Skin: Skin color, texture, turgor normal. No rashes or lesions Lymph nodes: Cervical, supraclavicular, and axillary nodes normal.  Lab Results  Component Value Date   HGBA1C 5.2 05/24/2020   HGBA1C 5.1 12/15/2019   HGBA1C 4.9 05/22/2019    Lab Results  Component Value Date   CREATININE 0.69 08/23/2020   CREATININE 0.57 05/24/2020   CREATININE 0.64 12/15/2019    Lab Results  Component Value Date   WBC 6.3 08/23/2020   HGB 14.4 08/23/2020   HCT 43.0 08/23/2020   PLT 321 08/23/2020   GLUCOSE 87 08/23/2020   CHOL 202 (H) 05/24/2020   TRIG 179.0 (H) 05/24/2020   HDL 45.20 05/24/2020   LDLCALC 121 (H) 05/24/2020   ALT 68 (H) 08/23/2020   AST 42 (H) 08/23/2020   NA 142 08/23/2020   K 3.5 08/23/2020   CL 101 08/23/2020   CREATININE 0.69 08/23/2020   BUN 5 (L) 08/23/2020   CO2 22 08/23/2020   TSH 1.53 05/24/2020   HGBA1C 5.2 05/24/2020    No results found.  Assessment  & Plan:   Problem List Items Addressed This Visit       Unprioritized   Generalized anxiety disorder    ESCALATION of alprazolam dose denied.  Starting lexapro.        Relevant Medications   escitalopram (LEXAPRO) 10 MG tablet   Obesity    Continue Ozempic/WEgovy .  Encouraged to maintain whatever dose is allowing her to continue to lose weight to avoid side effects.  Encouraged to  start exercising        Relevant Medications   Semaglutide-Weight Management 0.5 MG/0.5ML SOAJ   Sciatica of left side associated with disorder of lumbosacral spine    Pain management regimen reviewed ; continue tramadol, tylenol,  And gabapentin .  rx for oxycodone #30 refilled for prn use for severe pain        Relevant Medications   Semaglutide-Weight Management 0.5 MG/0.5ML SOAJ   escitalopram (LEXAPRO) 10 MG tablet   I spent 30 minutes dedicated to the care of this patient on the date of this encounter to include pre-visit review of her medical history,  Face-to-face time with the patient , counselling on weight management,  pain management and anxiety management, and post visit ordering of testing and therapeutics.   Meds ordered this encounter  Medications   Semaglutide-Weight Management 0.5 MG/0.5ML SOAJ    Sig: Inject 0.5 mg into the skin once a week.    Dispense:  2 mL    Refill:  0    CALL OFFICE TO ADVANCE DOSE   DISCONTD: oxyCODONE (OXY IR/ROXICODONE) 5 MG immediate release tablet    Sig: Take 1 tablet (5 mg total) by mouth every 6 (six) hours as needed for severe pain.    Dispense:  30 tablet    Refill:  0   escitalopram (LEXAPRO) 10 MG tablet    Sig: Take 1 tablet (10 mg total) by mouth daily.    Dispense:  30 tablet    Refill:  5   oxyCODONE (OXY IR/ROXICODONE) 5 MG immediate release tablet    Sig: Take 1 tablet (5 mg total)  by mouth every 6 (six) hours as needed for severe pain.    Dispense:  30 tablet    Refill:  0    Medications Discontinued During This Encounter   Medication Reason   cyclobenzaprine (FLEXERIL) 10 MG tablet Error   cyclobenzaprine (FLEXERIL) 10 MG tablet Error   ketorolac (TORADOL) 10 MG tablet Error   mirtazapine (REMERON) 7.5 MG tablet Error   Semaglutide-Weight Management (WEGOVY) 1.7 MG/0.75ML SOAJ    oxyCODONE (OXY IR/ROXICODONE) 5 MG immediate release tablet Reorder    Follow-up: Return in about 3 months (around 01/18/2021).   Crecencio Mc, MD

## 2020-10-19 ENCOUNTER — Other Ambulatory Visit: Payer: Self-pay

## 2020-10-19 ENCOUNTER — Telehealth: Payer: Self-pay

## 2020-10-19 NOTE — Telephone Encounter (Signed)
Medication has been approved

## 2020-10-19 NOTE — Telephone Encounter (Signed)
PA for Wegovy has been submitted on covermymeds.  °

## 2020-10-19 NOTE — Assessment & Plan Note (Signed)
ESCALATION of alprazolam dose denied.  Starting lexapro.

## 2020-10-19 NOTE — Assessment & Plan Note (Addendum)
Continue Ozempic/WEgovy .  Encouraged to maintain whatever dose is allowing her to continue to lose weight to avoid side effects.  Encouraged to  start exercising

## 2020-10-19 NOTE — Assessment & Plan Note (Signed)
Pain management regimen reviewed ; continue tramadol, tylenol,  And gabapentin .  rx for oxycodone #30 refilled for prn use for severe pain

## 2020-10-20 ENCOUNTER — Other Ambulatory Visit: Payer: Self-pay

## 2020-11-02 ENCOUNTER — Other Ambulatory Visit: Payer: Self-pay

## 2020-11-03 ENCOUNTER — Other Ambulatory Visit: Payer: Self-pay

## 2020-11-09 ENCOUNTER — Other Ambulatory Visit: Payer: Self-pay | Admitting: Internal Medicine

## 2020-11-09 ENCOUNTER — Other Ambulatory Visit: Payer: Self-pay

## 2020-11-09 MED ORDER — VITAMIN D (ERGOCALCIFEROL) 1.25 MG (50000 UNIT) PO CAPS
ORAL_CAPSULE | ORAL | 3 refills | Status: DC
Start: 2020-11-09 — End: 2022-09-07
  Filled 2020-11-09: qty 12, 84d supply, fill #0
  Filled 2020-12-06 – 2021-02-01 (×2): qty 12, 84d supply, fill #1
  Filled 2021-05-03 – 2021-05-06 (×2): qty 12, 84d supply, fill #2
  Filled 2021-09-15: qty 12, 84d supply, fill #3

## 2020-11-09 MED FILL — Montelukast Sodium Tab 10 MG (Base Equiv): ORAL | 90 days supply | Qty: 90 | Fill #0 | Status: AC

## 2020-11-09 MED FILL — Omeprazole Cap Delayed Release 40 MG: ORAL | 90 days supply | Qty: 180 | Fill #1 | Status: AC

## 2020-11-10 ENCOUNTER — Other Ambulatory Visit: Payer: Self-pay

## 2020-11-10 MED ORDER — CLINDAMYCIN PHOS-BENZOYL PEROX 1.2-5 % EX GEL
CUTANEOUS | 11 refills | Status: AC
  Filled 2020-11-10: qty 45, 30d supply, fill #0
  Filled 2020-12-06 – 2020-12-07 (×2): qty 45, 30d supply, fill #1
  Filled 2021-02-01: qty 45, 30d supply, fill #2
  Filled 2021-03-05: qty 45, 30d supply, fill #3
  Filled 2021-05-10: qty 45, 30d supply, fill #4

## 2020-11-11 ENCOUNTER — Other Ambulatory Visit: Payer: Self-pay

## 2020-11-12 ENCOUNTER — Other Ambulatory Visit: Payer: Self-pay

## 2020-11-12 MED ORDER — WEGOVY 1.7 MG/0.75ML ~~LOC~~ SOAJ
1.7000 mg | SUBCUTANEOUS | 2 refills | Status: DC
  Filled 2020-11-12: qty 3, 28d supply, fill #0

## 2020-11-12 MED ORDER — OZEMPIC (1 MG/DOSE) 4 MG/3ML ~~LOC~~ SOPN
1.0000 mg | PEN_INJECTOR | SUBCUTANEOUS | 2 refills | Status: DC
  Filled 2020-11-12: qty 3, 28d supply, fill #0
  Filled 2020-12-06 – 2020-12-07 (×2): qty 3, 28d supply, fill #1
  Filled 2021-01-16: qty 3, 28d supply, fill #2

## 2020-11-12 NOTE — Telephone Encounter (Signed)
I misread her message and sent in wegovy 1.7 mg.  She needs the ozempic 1 mg dose which I will send .  You still need to call her to find out if she needs to  adjust dose to  0.5 mg dose  or use 1 mg dose

## 2020-11-17 ENCOUNTER — Other Ambulatory Visit: Payer: Self-pay

## 2020-11-21 ENCOUNTER — Telehealth: Payer: 59 | Admitting: Family

## 2020-11-21 DIAGNOSIS — Z20822 Contact with and (suspected) exposure to covid-19: Secondary | ICD-10-CM | POA: Diagnosis not present

## 2020-11-21 DIAGNOSIS — J4520 Mild intermittent asthma, uncomplicated: Secondary | ICD-10-CM | POA: Diagnosis not present

## 2020-11-21 MED ORDER — MOLNUPIRAVIR EUA 200MG CAPSULE: 4.0000 | ORAL_CAPSULE | Freq: Two times a day (BID) | ORAL | 0 refills | Status: AC

## 2020-11-21 MED ORDER — BENZONATATE 100 MG PO CAPS: 100.0000 mg | ORAL_CAPSULE | Freq: Three times a day (TID) | ORAL | 0 refills | Status: DC | PRN

## 2020-11-21 NOTE — Progress Notes (Signed)
Virtual Visit Consent   Tammy Christian, you are scheduled for a virtual visit with a [facility] provider today.     Just as with appointments in the office, your consent must be obtained to participate.  Your consent will be active for this visit and any virtual visit you may have with one of our providers in the next 365 days.     If you have a MyChart account, a copy of this consent can be sent to you electronically.  All virtual visits are billed to your insurance company just like a traditional visit in the office.    As this is a virtual visit, video technology does not allow for your provider to perform a traditional examination.  This may limit your provider's ability to fully assess your condition.  If your provider identifies any concerns that need to be evaluated in person or the need to arrange testing (such as labs, EKG, etc.), we will make arrangements to do so.     Although advances in technology are sophisticated, we cannot ensure that it will always work on either your end or our end.  If the connection with a video visit is poor, the visit may have to be switched to a telephone visit.  With either a video or telephone visit, we are not always able to ensure that we have a secure connection.     I need to obtain your verbal consent now.   Are you willing to proceed with your visit today?    Tammy Christian has provided verbal consent on 11/21/2020 for a virtual visit (video or telephone).   [name], FNP   Date: 11/21/2020 12:42 PM   Virtual Visit via Video Note   I, [name], connected with  Tammy Christian  ([id number], 09/12/1980) on 11/21/20 at 12:15 PM EDT by a video-enabled telemedicine application and verified that I am speaking with the correct person using two identifiers.  Location: Patient: Virtual Visit Location Patient: Home Provider: Virtual Visit Location Provider: Home   I discussed the limitations of evaluation and management by  telemedicine and the availability of in person appointments. The patient expressed understanding and agreed to proceed.    History of Present Illness: Tammy Christian is a 40 y.o. who identifies as a female who was assigned female at birth, and is being seen today for COVID. She states her symptoms started Friday.   HPI: Cough This is a new problem. The current episode started in the past 7 days. The problem has been rapidly worsening. The problem occurs every few minutes. The cough is Productive of sputum. Associated symptoms include chills, ear pain, a fever, headaches, myalgias, nasal congestion, postnasal drip, rhinorrhea, a sore throat, shortness of breath and wheezing. Risk factors for lung disease include travel. She has tried rest (tylenol and motrin) for the symptoms.   Problems:  Patient Active Problem List   Diagnosis Date Noted   Major depressive disorder, recurrent ([place]) 08/23/2020   Menometrorrhagia 05/24/2020   Diarrhea in adult patient 05/24/2020   B12 deficiency 05/22/2019   Cervical radiculopathy due to degenerative joint disease of spine 10/28/2017   Low back pain potentially associated with radiculopathy 10/28/2017   Ovarian cyst, left 07/02/2017   Gastritis 06/07/2017   Iron deficiency anemia 05/15/2017   Lymphedema of leg 09/21/2016   Sciatica of left side associated with disorder of lumbosacral spine 04/02/2016   Vitamin D deficiency 05/01/2014   History of migraine 04/29/2014   Obesity  04/29/2014   Encounter for preventive health examination 04/29/2014   Generalized anxiety disorder 04/28/2014   Seasonal allergies 05/09/2013   Environmental allergies 05/09/2013   S/P laparoscopic sleeve gastrectomy 04/08/2012    Allergies: No Known Allergies Medications:  Current Outpatient Medications:    benzonatate (TESSALON PERLES) 100 MG capsule, Take 1 capsule (100 mg total) by mouth 3 (three) times daily as needed., Disp: 20 capsule, Rfl: 0   molnupiravir EUA 200  mg CAPS, Take 4 capsules (800 mg total) by mouth 2 (two) times daily for 5 days., Disp: 40 capsule, Rfl: 0   albuterol (VENTOLIN HFA) 108 (90 Base) MCG/ACT inhaler, Inhale 2 puffs into the lungs every 4 (four) hours as needed for wheezing or shortness of breath., Disp: 18 g, Rfl: 5   ALPRAZolam (XANAX) 1 MG tablet, Take 1.5 tablets (1.5 mg total) by mouth at bedtime as needed., Disp: 45 tablet, Rfl: 5   azelastine (ASTELIN) 0.1 % nasal spray, PLACE 2 SPRAYS INTO BOTH NOSTRILS 2 TIMES DAILY AS DIRECTED, Disp: 30 mL, Rfl: 9   bimatoprost (LATISSE) 0.03 % ophthalmic solution, APPLY 1 DROP TO UPPER EYE LID AT BEDTIME AS DIRECTED, Disp: 5 mL, Rfl: 6   Clindamycin-Benzoyl Per, Refr, gel, APPLY TO THE AFFECTED AREA(S) DAILY, Disp: 45 g, Rfl: 11   cyanocobalamin (,VITAMIN B-12,) 1000 MCG/ML injection, INJECT 1 MILLILITER (ML) INTO THE MUSCLE EVERY THIRTY DAYS, Disp: 3 mL, Rfl: 2   cyclobenzaprine (FLEXERIL) 10 MG tablet, Take 1 tablet (10 mg total) by mouth every 8 (eight) hours as needed for spasms, Disp: 90 tablet, Rfl: 5   desloratadine (CLARINEX) 5 MG tablet, TAKE 1 TABLET (5 MG TOTAL) BY MOUTH DAILY., Disp: 90 tablet, Rfl: 3   dicyclomine (BENTYL) 10 MG capsule, TAKE 1 CAPSULE (10 MG TOTAL) BY MOUTH 4 (FOUR) TIMES DAILY - BEFORE MEALS AND AT BEDTIME., Disp: 120 capsule, Rfl: 0   diptheria-tetanus toxoids (TDVAX) 2-2 LF/0.5ML injection, Inject into the muscle., Disp: 0.5 mL, Rfl: 0   escitalopram (LEXAPRO) 10 MG tablet, Take 1 tablet (10 mg total) by mouth daily., Disp: 30 tablet, Rfl: 5   fluticasone (FLONASE) 50 MCG/ACT nasal spray, Place 2 sprays into both nostrils daily., Disp: 16 g, Rfl: 5   gabapentin (NEURONTIN) 300 MG capsule, TAKE 1 CAPSULE (300 MG TOTAL) BY MOUTH 3 (THREE) TIMES DAILY., Disp: 270 capsule, Rfl: 1   lidocaine-prilocaine (EMLA) cream, APPLY TOPICALLY AS NEEDED., Disp: 30 g, Rfl: 3   mometasone (NASONEX) 50 MCG/ACT nasal spray, PLACE 2 SPRAYS INTO THE NOSE DAILY., Disp: 17 g, Rfl:  9   montelukast (SINGULAIR) 10 MG tablet, TAKE 1 TABLET BY MOUTH AT BEDTIME., Disp: 90 tablet, Rfl: 3   Multiple Vitamins-Minerals (CENTRUM WOMEN PO), , Disp: , Rfl:    omeprazole (PRILOSEC) 40 MG capsule, TAKE 1 CAPSULE BY MOUTH 2 TIMES DAILY BEFORE A MEAL., Disp: 180 capsule, Rfl: 3   ondansetron (ZOFRAN ODT) 8 MG disintegrating tablet, Take 1 tablet (8 mg total) by mouth every 8 (eight) hours as needed for nausea or vomiting., Disp: 40 tablet, Rfl: 2   ondansetron (ZOFRAN) 8 MG tablet, TAKE 1 TABLET BY MOUTH EVERY 8 HOURS AS NEEDED, Disp: 90 tablet, Rfl: 4   oxyCODONE (OXY IR/ROXICODONE) 5 MG immediate release tablet, Take 1 tablet (5 mg total) by mouth every 6 (six) hours as needed for severe pain., Disp: 30 tablet, Rfl: 0   propranolol (INDERAL) 10 MG tablet, Take 1 tablet (10 mg total) by mouth 3 (three) times daily.,  Disp: 90 tablet, Rfl: 5   propranolol ER (INDERAL LA) 120 MG 24 hr capsule, TAKE 1 CAPSULE BY MOUTH DAILY., Disp: 90 capsule, Rfl: 3   Semaglutide, 1 MG/DOSE, (OZEMPIC, 1 MG/DOSE,) 4 MG/3ML SOPN, Inject 1 mg into the skin once a week., Disp: 3 mL, Rfl: 2   SUMAtriptan (IMITREX) 100 MG tablet, TAKE 1 TABLET BY MOUTH DAILY AS NEEDED., Disp: 9 tablet, Rfl: 3   tamsulosin (FLOMAX) 0.4 MG CAPS capsule, Take 1 capsule (0.4 mg total) by mouth as needed (for kidney stone)., Disp: 30 capsule, Rfl: 6   traMADol (ULTRAM) 50 MG tablet, TAKE 1 TABLET (50 MG TOTAL) BY MOUTH EVERY 6 (SIX) HOURS AS NEEDED., Disp: 90 tablet, Rfl: 5   Vitamin D, Ergocalciferol, (DRISDOL) 1.25 MG (50000 UNIT) CAPS capsule, TAKE 1 CAPSULE BY MOUTH EVERY 7 DAYS ON SUNDAYS, Disp: 12 capsule, Rfl: 3  Observations/Objective: Patient is well-developed, well-nourished in no acute distress.  Resting comfortably  at home.  Head is normocephalic, atraumatic.  No labored breathing.  Speech is clear and coherent with logical content.  Patient is alert and oriented at baseline.  Nasal congestion   Assessment and  Plan: 1. Suspected COVID-19 virus infection - molnupiravir EUA 200 mg CAPS; Take 4 capsules (800 mg total) by mouth 2 (two) times daily for 5 days.  Dispense: 40 capsule; Refill: 0 - benzonatate (TESSALON PERLES) 100 MG capsule; Take 1 capsule (100 mg total) by mouth 3 (three) times daily as needed.  Dispense: 20 capsule; Refill: 0  2. Mild intermittent asthma without complication COVID positive, rest, force fluids, tylenol as needed, Quarantine for at least 5 days and fever free and wear mask out 5 days , report any worsening symptoms such as increased shortness of breath, swelling, or continued high fevers.    Follow Up Instructions: I discussed the assessment and treatment plan with the patient. The patient was provided an opportunity to ask questions and all were answered. The patient agreed with the plan and demonstrated an understanding of the instructions.  A copy of instructions were sent to the patient via MyChart.  The patient was advised to call back or seek an in-person evaluation if the symptoms worsen or if the condition fails to improve as anticipated.  Time:  I spent 11 minutes with the patient via telehealth technology discussing the above problems/concerns.    [name], FNP

## 2020-11-29 ENCOUNTER — Other Ambulatory Visit: Payer: Self-pay

## 2020-12-06 ENCOUNTER — Other Ambulatory Visit: Payer: Self-pay

## 2020-12-06 ENCOUNTER — Other Ambulatory Visit: Payer: Self-pay | Admitting: Internal Medicine

## 2020-12-06 DIAGNOSIS — R109 Unspecified abdominal pain: Secondary | ICD-10-CM

## 2020-12-07 ENCOUNTER — Other Ambulatory Visit: Payer: Self-pay

## 2020-12-07 MED ORDER — DICYCLOMINE HCL 10 MG PO CAPS
10.0000 mg | ORAL_CAPSULE | Freq: Three times a day (TID) | ORAL | 0 refills | Status: DC
  Filled 2020-12-07: qty 120, 30d supply, fill #0

## 2020-12-08 ENCOUNTER — Other Ambulatory Visit: Payer: Self-pay

## 2020-12-08 ENCOUNTER — Telehealth: Payer: 59 | Admitting: Nurse Practitioner

## 2020-12-08 DIAGNOSIS — L239 Allergic contact dermatitis, unspecified cause: Secondary | ICD-10-CM

## 2020-12-08 MED ORDER — PREDNISONE 10 MG PO TABS
ORAL_TABLET | ORAL | 0 refills | Status: DC
  Filled 2020-12-08: qty 21, 6d supply, fill #0

## 2020-12-08 NOTE — Progress Notes (Signed)
Virtual Visit Consent   Tammy Christian, you are scheduled for a virtual visit with Mary-Margaret Hassell Done, [place], a [facility] provider, today.     Just as with appointments in the office, your consent must be obtained to participate.  Your consent will be active for this visit and any virtual visit you may have with one of our providers in the next 365 days.     If you have a MyChart account, a copy of this consent can be sent to you electronically.  All virtual visits are billed to your insurance company just like a traditional visit in the office.    As this is a virtual visit, video technology does not allow for your provider to perform a traditional examination.  This may limit your provider's ability to fully assess your condition.  If your provider identifies any concerns that need to be evaluated in person or the need to arrange testing (such as labs, EKG, etc.), we will make arrangements to do so.     Although advances in technology are sophisticated, we cannot ensure that it will always work on either your end or our end.  If the connection with a video visit is poor, the visit may have to be switched to a telephone visit.  With either a video or telephone visit, we are not always able to ensure that we have a secure connection.     I need to obtain your verbal consent now.   Are you willing to proceed with your visit today? YES   Tammy Christian has provided verbal consent on 12/08/2020 for a virtual visit (video or telephone).   Mary-Margaret Hassell Done, FNP   Date: 12/08/2020 9:10 AM   Virtual Visit via Video Note   I, Mary-Margaret Hassell Done, connected with Tammy Christian ([id number], [birth year]) on 12/08/20 at  9:30 AM EDT by a video-enabled telemedicine application and verified that I am speaking with the correct person using two identifiers.  Location: Patient: Virtual Visit Location Patient: Home Provider: Virtual Visit Location Provider: Mobile   I discussed  the limitations of evaluation and management by telemedicine and the availability of in person appointments. The patient expressed understanding and agreed to proceed.    History of Present Illness: Tammy Christian is a 40 y.o. who identifies as a female who was assigned female at birth, and is being seen today for post covid rash .  HPI: Patient had covid 2 weeks ago. She woke up with itchy rash on chest and back. Has been spreading since she noticed it. She took some benadryl. She denies any new soaps, detergents, or foods.   Review of Systems  Constitutional:  Positive for malaise/fatigue (since covid). Negative for chills and fever.  HENT:  Positive for congestion and sore throat (mild).   Respiratory: Negative.    Cardiovascular: Negative.   Genitourinary: Negative.   Musculoskeletal: Negative.   Neurological: Negative.   Psychiatric/Behavioral: Negative.     Problems:  Patient Active Problem List   Diagnosis Date Noted   Major depressive disorder, recurrent ([place]) 08/23/2020   Menometrorrhagia 05/24/2020   Diarrhea in adult patient 05/24/2020   B12 deficiency 05/22/2019   Cervical radiculopathy due to degenerative joint disease of spine 10/28/2017   Low back pain potentially associated with radiculopathy 10/28/2017   Ovarian cyst, left 07/02/2017   Gastritis 06/07/2017   Iron deficiency anemia 05/15/2017   Lymphedema of leg 09/21/2016   Sciatica of left side associated with disorder of lumbosacral spine  04/02/2016   Vitamin D deficiency 05/01/2014   History of migraine 04/29/2014   Obesity 04/29/2014   Encounter for preventive health examination 04/29/2014   Generalized anxiety disorder 04/28/2014   Seasonal allergies 05/09/2013   Environmental allergies 05/09/2013   S/P laparoscopic sleeve gastrectomy 04/08/2012    Allergies: No Known Allergies Medications:  Current Outpatient Medications:    albuterol (VENTOLIN HFA) 108 (90 Base) MCG/ACT inhaler, Inhale 2 puffs  into the lungs every 4 (four) hours as needed for wheezing or shortness of breath., Disp: 18 g, Rfl: 5   ALPRAZolam (XANAX) 1 MG tablet, Take 1.5 tablets (1.5 mg total) by mouth at bedtime as needed., Disp: 45 tablet, Rfl: 5   azelastine (ASTELIN) 0.1 % nasal spray, PLACE 2 SPRAYS INTO BOTH NOSTRILS 2 TIMES DAILY AS DIRECTED, Disp: 30 mL, Rfl: 9   benzonatate (TESSALON PERLES) 100 MG capsule, Take 1 capsule (100 mg total) by mouth 3 (three) times daily as needed., Disp: 20 capsule, Rfl: 0   bimatoprost (LATISSE) 0.03 % ophthalmic solution, APPLY 1 DROP TO UPPER EYE LID AT BEDTIME AS DIRECTED, Disp: 5 mL, Rfl: 6   Clindamycin-Benzoyl Per, Refr, gel, APPLY TO THE AFFECTED AREA(S) DAILY, Disp: 45 g, Rfl: 11   cyanocobalamin (,VITAMIN B-12,) 1000 MCG/ML injection, INJECT 1 MILLILITER (ML) INTO THE MUSCLE EVERY THIRTY DAYS, Disp: 3 mL, Rfl: 2   cyclobenzaprine (FLEXERIL) 10 MG tablet, Take 1 tablet (10 mg total) by mouth every 8 (eight) hours as needed for spasms, Disp: 90 tablet, Rfl: 5   desloratadine (CLARINEX) 5 MG tablet, TAKE 1 TABLET (5 MG TOTAL) BY MOUTH DAILY., Disp: 90 tablet, Rfl: 3   dicyclomine (BENTYL) 10 MG capsule, Take 1 capsule (10 mg total) by mouth 4 (four) times daily -  before meals and at bedtime., Disp: 120 capsule, Rfl: 0   diptheria-tetanus toxoids (TDVAX) 2-2 LF/0.5ML injection, Inject into the muscle., Disp: 0.5 mL, Rfl: 0   escitalopram (LEXAPRO) 10 MG tablet, Take 1 tablet (10 mg total) by mouth daily., Disp: 30 tablet, Rfl: 5   fluticasone (FLONASE) 50 MCG/ACT nasal spray, Place 2 sprays into both nostrils daily., Disp: 16 g, Rfl: 5   gabapentin (NEURONTIN) 300 MG capsule, TAKE 1 CAPSULE (300 MG TOTAL) BY MOUTH 3 (THREE) TIMES DAILY., Disp: 270 capsule, Rfl: 1   lidocaine-prilocaine (EMLA) cream, APPLY TOPICALLY AS NEEDED., Disp: 30 g, Rfl: 3   mometasone (NASONEX) 50 MCG/ACT nasal spray, PLACE 2 SPRAYS INTO THE NOSE DAILY., Disp: 17 g, Rfl: 9   montelukast (SINGULAIR) 10 MG  tablet, TAKE 1 TABLET BY MOUTH AT BEDTIME., Disp: 90 tablet, Rfl: 3   Multiple Vitamins-Minerals (CENTRUM WOMEN PO), , Disp: , Rfl:    omeprazole (PRILOSEC) 40 MG capsule, TAKE 1 CAPSULE BY MOUTH 2 TIMES DAILY BEFORE A MEAL., Disp: 180 capsule, Rfl: 3   ondansetron (ZOFRAN ODT) 8 MG disintegrating tablet, Take 1 tablet (8 mg total) by mouth every 8 (eight) hours as needed for nausea or vomiting., Disp: 40 tablet, Rfl: 2   ondansetron (ZOFRAN) 8 MG tablet, TAKE 1 TABLET BY MOUTH EVERY 8 HOURS AS NEEDED, Disp: 90 tablet, Rfl: 4   oxyCODONE (OXY IR/ROXICODONE) 5 MG immediate release tablet, Take 1 tablet (5 mg total) by mouth every 6 (six) hours as needed for severe pain., Disp: 30 tablet, Rfl: 0   propranolol (INDERAL) 10 MG tablet, Take 1 tablet (10 mg total) by mouth 3 (three) times daily., Disp: 90 tablet, Rfl: 5   propranolol ER (INDERAL  LA) 120 MG 24 hr capsule, TAKE 1 CAPSULE BY MOUTH DAILY., Disp: 90 capsule, Rfl: 3   Semaglutide, 1 MG/DOSE, (OZEMPIC, 1 MG/DOSE,) 4 MG/3ML SOPN, Inject 1 mg into the skin once a week., Disp: 3 mL, Rfl: 2   SUMAtriptan (IMITREX) 100 MG tablet, TAKE 1 TABLET BY MOUTH DAILY AS NEEDED., Disp: 9 tablet, Rfl: 3   tamsulosin (FLOMAX) 0.4 MG CAPS capsule, Take 1 capsule (0.4 mg total) by mouth as needed (for kidney stone)., Disp: 30 capsule, Rfl: 6   traMADol (ULTRAM) 50 MG tablet, TAKE 1 TABLET (50 MG TOTAL) BY MOUTH EVERY 6 (SIX) HOURS AS NEEDED., Disp: 90 tablet, Rfl: 5   Vitamin D, Ergocalciferol, (DRISDOL) 1.25 MG (50000 UNIT) CAPS capsule, TAKE 1 CAPSULE BY MOUTH EVERY 7 DAYS ON SUNDAYS, Disp: 12 capsule, Rfl: 3  Observations/Objective: Patient is well-developed, well-nourished in no acute distress.  Resting comfortably  at home.  Head is normocephalic, atraumatic.  No labored breathing.  Speech is clear and coherent with logical content.  Patient is alert and oriented at baseline.  Spotchy rash on chest and back  Assessment and Plan:  Tammy Christian  in today with chief complaint of rash  1. Allergic dermatitis Avoid scratching Cool compresses Meds ordered this encounter  Medications   predniSONE (STERAPRED UNI-PAK 21 TAB) 10 MG (21) TBPK tablet    Sig: As directed x 6 days    Dispense:  21 tablet    Refill:  0    Order Specific Question:   Supervising Provider    Answer:   Noemi Chapel [[id number]]      Follow Up Instructions: I discussed the assessment and treatment plan with the patient. The patient was provided an opportunity to ask questions and all were answered. The patient agreed with the plan and demonstrated an understanding of the instructions.  A copy of instructions were sent to the patient via MyChart.  The patient was advised to call back or seek an in-person evaluation if the symptoms worsen or if the condition fails to improve as anticipated.  Time:  I spent 11 minutes with the patient via telehealth technology discussing the above problems/concerns.    Mary-Margaret Hassell Done, FNP

## 2020-12-10 ENCOUNTER — Other Ambulatory Visit: Payer: Self-pay

## 2020-12-20 ENCOUNTER — Other Ambulatory Visit: Payer: Self-pay

## 2020-12-20 MED ORDER — KETOROLAC TROMETHAMINE 10 MG PO TABS
10.0000 mg | ORAL_TABLET | Freq: Four times a day (QID) | ORAL | 2 refills | Status: DC | PRN
  Filled 2020-12-20: qty 30, 8d supply, fill #0
  Filled 2021-02-01: qty 30, 8d supply, fill #1

## 2020-12-20 MED ORDER — TIZANIDINE HCL 4 MG PO TABS
4.0000 mg | ORAL_TABLET | Freq: Four times a day (QID) | ORAL | 5 refills | Status: DC | PRN
  Filled 2020-12-20: qty 90, 23d supply, fill #0
  Filled 2021-01-14 (×2): qty 90, 23d supply, fill #1
  Filled 2021-02-14: qty 90, 23d supply, fill #2
  Filled 2021-03-22: qty 90, 23d supply, fill #3

## 2020-12-24 ENCOUNTER — Other Ambulatory Visit: Payer: Self-pay

## 2021-01-06 ENCOUNTER — Other Ambulatory Visit: Payer: Self-pay

## 2021-01-14 ENCOUNTER — Other Ambulatory Visit: Payer: Self-pay

## 2021-01-17 ENCOUNTER — Other Ambulatory Visit: Payer: Self-pay

## 2021-01-20 ENCOUNTER — Ambulatory Visit: Payer: 59 | Admitting: Internal Medicine

## 2021-01-31 ENCOUNTER — Telehealth: Payer: 59 | Admitting: Internal Medicine

## 2021-02-01 ENCOUNTER — Other Ambulatory Visit: Payer: Self-pay | Admitting: Internal Medicine

## 2021-02-01 ENCOUNTER — Other Ambulatory Visit: Payer: Self-pay

## 2021-02-01 DIAGNOSIS — R109 Unspecified abdominal pain: Secondary | ICD-10-CM

## 2021-02-01 MED ORDER — SUMATRIPTAN SUCCINATE 100 MG PO TABS
ORAL_TABLET | Freq: Every day | ORAL | 3 refills | Status: DC | PRN
  Filled 2021-02-01: qty 9, 30d supply, fill #0
  Filled 2021-03-05: qty 9, 30d supply, fill #1
  Filled 2021-05-03 – 2021-05-06 (×2): qty 9, 30d supply, fill #2
  Filled 2021-09-15: qty 9, 30d supply, fill #3

## 2021-02-01 MED ORDER — OZEMPIC (1 MG/DOSE) 4 MG/3ML ~~LOC~~ SOPN
1.0000 mg | PEN_INJECTOR | SUBCUTANEOUS | 2 refills | Status: DC
  Filled 2021-02-01 – 2021-02-14 (×2): qty 3, 28d supply, fill #0
  Filled 2021-03-05: qty 3, 28d supply, fill #1
  Filled 2021-05-03 – 2021-05-06 (×2): qty 3, 28d supply, fill #2

## 2021-02-01 MED ORDER — DICYCLOMINE HCL 10 MG PO CAPS
10.0000 mg | ORAL_CAPSULE | Freq: Three times a day (TID) | ORAL | 0 refills | Status: DC
  Filled 2021-02-01: qty 120, 30d supply, fill #0

## 2021-02-01 MED FILL — Ondansetron HCl Tab 8 MG: ORAL | 30 days supply | Qty: 90 | Fill #1 | Status: AC

## 2021-02-01 MED FILL — Montelukast Sodium Tab 10 MG (Base Equiv): ORAL | 90 days supply | Qty: 90 | Fill #1 | Status: AC

## 2021-02-01 MED FILL — Omeprazole Cap Delayed Release 40 MG: ORAL | 90 days supply | Qty: 180 | Fill #2 | Status: AC

## 2021-02-02 ENCOUNTER — Other Ambulatory Visit: Payer: Self-pay

## 2021-02-03 ENCOUNTER — Other Ambulatory Visit: Payer: Self-pay

## 2021-02-04 ENCOUNTER — Other Ambulatory Visit: Payer: Self-pay

## 2021-02-07 ENCOUNTER — Encounter: Payer: Self-pay | Admitting: Internal Medicine

## 2021-02-07 ENCOUNTER — Other Ambulatory Visit: Payer: Self-pay

## 2021-02-07 ENCOUNTER — Telehealth (INDEPENDENT_AMBULATORY_CARE_PROVIDER_SITE_OTHER): Payer: 59 | Admitting: Internal Medicine

## 2021-02-07 VITALS — Ht 67.0 in | Wt 200.0 lb

## 2021-02-07 DIAGNOSIS — R748 Abnormal levels of other serum enzymes: Secondary | ICD-10-CM | POA: Insufficient documentation

## 2021-02-07 DIAGNOSIS — F411 Generalized anxiety disorder: Secondary | ICD-10-CM | POA: Diagnosis not present

## 2021-02-07 DIAGNOSIS — Z6841 Body Mass Index (BMI) 40.0 and over, adult: Secondary | ICD-10-CM | POA: Diagnosis not present

## 2021-02-07 DIAGNOSIS — M5387 Other specified dorsopathies, lumbosacral region: Secondary | ICD-10-CM | POA: Diagnosis not present

## 2021-02-07 MED ORDER — TRAMADOL HCL 50 MG PO TABS
100.0000 mg | ORAL_TABLET | Freq: Four times a day (QID) | ORAL | 5 refills | Status: DC | PRN
  Filled 2021-02-07: qty 120, 15d supply, fill #0
  Filled 2021-03-05: qty 120, 15d supply, fill #1

## 2021-02-07 MED ORDER — ESCITALOPRAM OXALATE 20 MG PO TABS
20.0000 mg | ORAL_TABLET | Freq: Every day | ORAL | 1 refills | Status: DC
  Filled 2021-02-07: qty 90, 90d supply, fill #0
  Filled 2021-05-03 – 2021-05-06 (×2): qty 90, 90d supply, fill #1

## 2021-02-07 MED ORDER — ALPRAZOLAM 1 MG PO TABS
1.5000 mg | ORAL_TABLET | Freq: Every evening | ORAL | 5 refills | Status: DC | PRN
  Filled 2021-02-07 – 2021-03-04 (×2): qty 45, 30d supply, fill #0

## 2021-02-07 NOTE — Assessment & Plan Note (Signed)
She continues to lose weight on semiglutide, and has had to reduce dosing to every 14 days to avoid overly rapid weight loss.  22 lbs since August.  Total weight 50 lbs.  Goal is 175 lbs .

## 2021-02-07 NOTE — Assessment & Plan Note (Signed)
Etiology unclear. May be fatty liver.  She is s/p cholecystectomy and denies any recent history of abdominal pain  strongly urged to repeat a panel at [facility] .  abd u/s to be done if the remain elevated  Lab Results  Component Value Date   ALT 68 (H) 08/23/2020   AST 42 (H) 08/23/2020   ALKPHOS 137 (H) 08/23/2020   BILITOT 0.5 08/23/2020

## 2021-02-07 NOTE — Progress Notes (Signed)
Virtual Visit via [place] Note  This visit type was conducted due to national recommendations for restrictions regarding the COVID-19 pandemic (e.g. social distancing).  This format is felt to be most appropriate for this patient at this time.  All issues noted in this document were discussed and addressed.  No physical exam was performed (except for noted visual exam findings with Video Visits).   I connected withNAME@ on 02/07/21 at  4:30 PM EST by a video enabled telemedicine application  and verified that I am speaking with the correct person using two identifiers. Location patient: home Location provider: work or home office Persons participating in the virtual visit: patient, provider  I discussed the limitations, risks, security and privacy concerns of performing an evaluation and management service by telephone and the availability of in person appointments. I also discussed with the patient that there may be a patient responsible charge related to this service. The patient expressed understanding and agreed to proceed.  Reason for visit: follow up on management of obesity,  chronic back pain and anxiety   HPI:  40 yr old female with history of GAD,  previously managed with bid clonazepam, morbid obesity s/p gastric sleeve remotely with weight regain, chronic low back pain with history of prior surgery presents for follow up.  1) Back pain :  aggravated by use of lead apron during vascular procedures;  generally improved with weight loss of 50 lbs.  Using tramadol 100 mg prn , along with tizanidine. .  Refill history confirmed via [place] Controlled Substance databas, accessed by me today. Suggests she is refilling every 3 weeks  #90 tablets   2) Obesity:  using Wegovy to suppress appetite,  has lost 22 lbs in 3 months,  has reduce frequency of dosing to every other week ,  still losing weight .  3) Anxiety:  tolerating lexapro after headaches resolved,  less irritable but still anxious .   Did not tolerate trial of buspirone due ot nausea.  Discussed increasing dose of lexapro to 20 mg daily   ROS: See pertinent positives and negatives per HPI.  Past Medical History:  Diagnosis Date   Abnormal Pap smear of cervix 2012   Abortion 2001   Allergy    Anxiety    panic disorder   Arthritis    Asthma    Gastritis 2005   Hyperlipidemia    Kidney stone    multiple   Migraines    Obesity    Panic disorder    Vitamin D deficiency disease     Past Surgical History:  Procedure Laterality Date   BREAST BIOPSY Right 02/04/2001   Negative c Clip   CHOLECYSTECTOMY  04/08/2012   COLONOSCOPY WITH PROPOFOL N/A 07/12/2017   Procedure: COLONOSCOPY WITH PROPOFOL;  Surgeon: Lin Landsman, MD;  Location: [place] [place];  Service: Gastroenterology;  Laterality: N/A;   COLPOSCOPY  2012   ESOPHAGOGASTRODUODENOSCOPY (EGD) WITH PROPOFOL N/A 07/12/2017   Procedure: ESOPHAGOGASTRODUODENOSCOPY (EGD) WITH PROPOFOL;  Surgeon: Lin Landsman, MD;  Location: [place];  Service: Gastroenterology;  Laterality: N/A;   KNEE ARTHROSCOPY Left 05/01/2016   Procedure: ARTHROSCOPY KNEE LATERAL RELEASE;  Surgeon: Hessie Knows, MD;  Location: [facility];  Service: Orthopedics;  Laterality: Left;   laprascopic sleeve  04/08/2012   GASTRIC   SPINAL FUSION  09/25/2016    Family History  Problem Relation Age of Onset   Cancer Maternal Grandmother        LUNG, THROAT   Macular degeneration Maternal  Grandmother    Heart disease Paternal Grandmother    Alzheimer's disease Paternal Grandmother    Stroke Paternal Grandmother    Cancer Paternal Grandfather        COLON   Diabetes Father    Heart disease Father        atrial fib   Hyperlipidemia Father    Hypertension Father    Cancer Father 32       colon polyps   Colon polyps Father     SOCIAL HX:  reports that she has never smoked. She has never used smokeless tobacco. She reports current alcohol use of about 2.0 standard drinks per  week. She reports that she does not use drugs.    Current Outpatient Medications:    albuterol (VENTOLIN HFA) 108 (90 Base) MCG/ACT inhaler, Inhale 2 puffs into the lungs every 4 (four) hours as needed for wheezing or shortness of breath., Disp: 18 g, Rfl: 5   azelastine (ASTELIN) 0.1 % nasal spray, PLACE 2 SPRAYS INTO BOTH NOSTRILS 2 TIMES DAILY AS DIRECTED, Disp: 30 mL, Rfl: 9   bimatoprost (LATISSE) 0.03 % ophthalmic solution, APPLY 1 DROP TO UPPER EYE LID AT BEDTIME AS DIRECTED, Disp: 5 mL, Rfl: 6   Clindamycin-Benzoyl Per, Refr, gel, APPLY TO THE AFFECTED AREA(S) DAILY, Disp: 45 g, Rfl: 11   cyanocobalamin (,VITAMIN B-12,) 1000 MCG/ML injection, INJECT 1 MILLILITER (ML) INTO THE MUSCLE EVERY THIRTY DAYS, Disp: 3 mL, Rfl: 2   cyclobenzaprine (FLEXERIL) 10 MG tablet, Take 1 tablet (10 mg total) by mouth every 8 (eight) hours as needed for spasms, Disp: 90 tablet, Rfl: 5   desloratadine (CLARINEX) 5 MG tablet, TAKE 1 TABLET (5 MG TOTAL) BY MOUTH DAILY., Disp: 90 tablet, Rfl: 3   dicyclomine (BENTYL) 10 MG capsule, Take 1 capsule (10 mg total) by mouth 4 (four) times daily -  before meals and at bedtime., Disp: 120 capsule, Rfl: 0   lidocaine-prilocaine (EMLA) cream, APPLY TOPICALLY AS NEEDED., Disp: 30 g, Rfl: 3   mometasone (NASONEX) 50 MCG/ACT nasal spray, PLACE 2 SPRAYS INTO THE NOSE DAILY., Disp: 17 g, Rfl: 9   montelukast (SINGULAIR) 10 MG tablet, TAKE 1 TABLET BY MOUTH AT BEDTIME., Disp: 90 tablet, Rfl: 3   Multiple Vitamins-Minerals (CENTRUM WOMEN PO), , Disp: , Rfl:    omeprazole (PRILOSEC) 40 MG capsule, TAKE 1 CAPSULE BY MOUTH 2 TIMES DAILY BEFORE A MEAL., Disp: 180 capsule, Rfl: 3   ondansetron (ZOFRAN) 8 MG tablet, TAKE 1 TABLET BY MOUTH EVERY 8 HOURS AS NEEDED, Disp: 90 tablet, Rfl: 4   oxyCODONE (OXY IR/ROXICODONE) 5 MG immediate release tablet, Take 1 tablet (5 mg total) by mouth every 6 (six) hours as needed for severe pain., Disp: 30 tablet, Rfl: 0   propranolol (INDERAL) 10 MG  tablet, Take 1 tablet (10 mg total) by mouth 3 (three) times daily., Disp: 90 tablet, Rfl: 5   propranolol ER (INDERAL LA) 120 MG 24 hr capsule, TAKE 1 CAPSULE BY MOUTH DAILY., Disp: 90 capsule, Rfl: 3   Semaglutide, 1 MG/DOSE, (OZEMPIC, 1 MG/DOSE,) 4 MG/3ML SOPN, Inject 1 mg into the skin once a week., Disp: 3 mL, Rfl: 2   SUMAtriptan (IMITREX) 100 MG tablet, TAKE 1 TABLET BY MOUTH DAILY AS NEEDED., Disp: 9 tablet, Rfl: 3   tamsulosin (FLOMAX) 0.4 MG CAPS capsule, Take 1 capsule (0.4 mg total) by mouth as needed (for kidney stone)., Disp: 30 capsule, Rfl: 6   tiZANidine (ZANAFLEX) 4 MG tablet, Take 1 tablet (  4 mg total) by mouth every 6 (six) hours as needed for spasms, Disp: 90 tablet, Rfl: 5   Vitamin D, Ergocalciferol, (DRISDOL) 1.25 MG (50000 UNIT) CAPS capsule, TAKE 1 CAPSULE BY MOUTH EVERY 7 DAYS ON SUNDAYS, Disp: 12 capsule, Rfl: 3   ALPRAZolam (XANAX) 1 MG tablet, Take 1.5 tablets (1.5 mg total) by mouth at bedtime as needed., Disp: 45 tablet, Rfl: 5   benzonatate (TESSALON PERLES) 100 MG capsule, Take 1 capsule (100 mg total) by mouth 3 (three) times daily as needed., Disp: 20 capsule, Rfl: 0   diptheria-tetanus toxoids (TDVAX) 2-2 LF/0.5ML injection, Inject into the muscle., Disp: 0.5 mL, Rfl: 0   escitalopram (LEXAPRO) 20 MG tablet, Take 1 tablet (20 mg total) by mouth daily., Disp: 90 tablet, Rfl: 1   fluticasone (FLONASE) 50 MCG/ACT nasal spray, Place 2 sprays into both nostrils daily., Disp: 16 g, Rfl: 5   gabapentin (NEURONTIN) 300 MG capsule, TAKE 1 CAPSULE (300 MG TOTAL) BY MOUTH 3 (THREE) TIMES DAILY., Disp: 270 capsule, Rfl: 1   ondansetron (ZOFRAN ODT) 8 MG disintegrating tablet, Take 1 tablet (8 mg total) by mouth every 8 (eight) hours as needed for nausea or vomiting., Disp: 40 tablet, Rfl: 2   predniSONE (DELTASONE) 10 MG tablet, Take 6 tabs on day 1, 5 tabs on day 2, 4 tabs on day 3, 3 tabs on day 4, 2 tabs on day 5, 1 tab on day 6. Then stop., Disp: 21 tablet, Rfl: 0    traMADol (ULTRAM) 50 MG tablet, Take 2 tablets (100 mg total) by mouth every 6 (six) hours as needed., Disp: 120 tablet, Rfl: 5  EXAM:  VITALS per patient if applicable:  GENERAL: alert, oriented, appears well and in no acute distress  HEENT: atraumatic, conjunttiva clear, no obvious abnormalities on inspection of external nose and ears  NECK: normal movements of the head and neck  LUNGS: on inspection no signs of respiratory distress, breathing rate appears normal, no obvious gross SOB, gasping or wheezing  CV: no obvious cyanosis  MS: moves all visible extremities without noticeable abnormality  PSYCH/NEURO: pleasant and cooperative, no obvious depression or anxiety, speech and thought processing grossly intact  ASSESSMENT AND PLAN:  Discussed the following assessment and plan:  Elevated liver enzymes - Plan: Comprehensive metabolic panel  Class 3 severe obesity due to excess calories without serious comorbidity with body mass index (BMI) of 40.0 to 44.9 in adult [facility])  Generalized anxiety disorder  Sciatica of left side associated with disorder of lumbosacral spine  Obesity She continues to lose weight on semiglutide, and has had to reduce dosing to every 14 days to avoid overly rapid weight loss.  22 lbs since August.  Total weight 50 lbs.  Goal is 175 lbs .  Generalized anxiety disorder Irritability has improved with use of lexapro at 10 mg dose but feels her anxiety has not been controlled.  Did not tolerate buspirone . Advised to try increasing dose to 20 mg daily .  Serotonin syndrome symptoms described given concurrent use of tramadol. Using #45 alprazolam per month: 1 mg at bedtime, some days takes one during the day prn . The risks and benefits of benzodiazepine use were reviewed with patient today including excessive sedation leading to respiratory depression,  impaired thinking/driving, and addiction.  Patient was advised to avoid concurrent use with alcohol, to use  medication only as needed and not to share with others  .   Sciatica of left side associated with disorder  of lumbosacral spine Pain management has improved with weight loss but is aggravated by procedures requiring wearing of the lead apron .  She is using 2  tramadol daily every 6 hours as needed along with tizanidine .  Elevated liver enzymes Etiology unclear. May be fatty liver.  She is s/p cholecystectomy and denies any recent history of abdominal pain  strongly urged to repeat a panel at [facility] .  abd u/s to be done if the remain elevated  Lab Results  Component Value Date   ALT 68 (H) 08/23/2020   AST 42 (H) 08/23/2020   ALKPHOS 137 (H) 08/23/2020   BILITOT 0.5 08/23/2020      I discussed the assessment and treatment plan with the patient. The patient was provided an opportunity to ask questions and all were answered. The patient agreed with the plan and demonstrated an understanding of the instructions.   The patient was advised to call back or seek an in-person evaluation if the symptoms worsen or if the condition fails to improve as anticipated.   I spent 30 minutes dedicated to the care of this patient on the date of this encounter to include pre-visit review of his medical history,  Face-to-face time with the patient , and post visit ordering of testing and therapeutics.    Crecencio Mc, MD

## 2021-02-07 NOTE — Assessment & Plan Note (Addendum)
Pain management has improved with weight loss but is aggravated by procedures requiring wearing of the lead apron .  She is using 2  tramadol daily every 6 hours as needed along with tizanidine .

## 2021-02-07 NOTE — Assessment & Plan Note (Addendum)
Irritability has improved with use of lexapro at 10 mg dose but feels her anxiety has not been controlled.  Did not tolerate buspirone . Advised to try increasing dose to 20 mg daily .  Serotonin syndrome symptoms described given concurrent use of tramadol. Using #45 alprazolam per month: 1 mg at bedtime, some days takes one during the day prn . The risks and benefits of benzodiazepine use were reviewed with patient today including excessive sedation leading to respiratory depression,  impaired thinking/driving, and addiction.  Patient was advised to avoid concurrent use with alcohol, to use medication only as needed and not to share with others  .

## 2021-02-08 ENCOUNTER — Other Ambulatory Visit: Payer: Self-pay

## 2021-02-08 MED ORDER — METOCLOPRAMIDE HCL 10 MG PO TABS
ORAL_TABLET | ORAL | 1 refills | Status: DC
  Filled 2021-02-08: qty 90, 30d supply, fill #0
  Filled 2021-03-05: qty 90, 30d supply, fill #1

## 2021-02-14 ENCOUNTER — Other Ambulatory Visit: Payer: Self-pay

## 2021-02-14 MED ORDER — AMOXICILLIN-POT CLAVULANATE 875-125 MG PO TABS
ORAL_TABLET | ORAL | 0 refills | Status: DC
  Filled 2021-02-14: qty 14, 7d supply, fill #0

## 2021-02-17 ENCOUNTER — Telehealth: Payer: 59 | Admitting: Internal Medicine

## 2021-02-28 ENCOUNTER — Other Ambulatory Visit: Payer: Self-pay

## 2021-03-04 ENCOUNTER — Other Ambulatory Visit: Payer: Self-pay

## 2021-03-05 ENCOUNTER — Encounter: Payer: Self-pay | Admitting: Internal Medicine

## 2021-03-05 ENCOUNTER — Other Ambulatory Visit: Payer: Self-pay | Admitting: Internal Medicine

## 2021-03-05 DIAGNOSIS — R109 Unspecified abdominal pain: Secondary | ICD-10-CM

## 2021-03-05 MED FILL — Ondansetron HCl Tab 8 MG: ORAL | 30 days supply | Qty: 90 | Fill #2 | Status: AC

## 2021-03-06 ENCOUNTER — Other Ambulatory Visit: Payer: Self-pay

## 2021-03-07 ENCOUNTER — Other Ambulatory Visit: Payer: Self-pay

## 2021-03-07 ENCOUNTER — Other Ambulatory Visit (INDEPENDENT_AMBULATORY_CARE_PROVIDER_SITE_OTHER): Payer: Self-pay | Admitting: Vascular Surgery

## 2021-03-07 MED ORDER — INSULIN PEN NEEDLE 32G X 6 MM MISC
1.0000 | 0 refills | Status: DC
  Filled 2021-03-07: qty 100, 90d supply, fill #0

## 2021-03-07 MED ORDER — DOXYCYCLINE HYCLATE 100 MG PO CAPS
ORAL_CAPSULE | ORAL | 0 refills | Status: DC
  Filled 2021-03-07 (×2): qty 20, 10d supply, fill #0

## 2021-03-07 MED ORDER — CLOTRIMAZOLE 1 % EX CREA
TOPICAL_CREAM | CUTANEOUS | 2 refills | Status: DC
  Filled 2021-03-07: qty 30, 10d supply, fill #0

## 2021-03-07 MED ORDER — DICYCLOMINE HCL 10 MG PO CAPS
10.0000 mg | ORAL_CAPSULE | Freq: Three times a day (TID) | ORAL | 0 refills | Status: DC
  Filled 2021-03-07: qty 120, 30d supply, fill #0

## 2021-03-07 MED ORDER — "SYRINGE 25G X 1"" 3 ML MISC"
1.0000 | 1 refills | Status: DC
  Filled 2021-03-07: qty 50, fill #0

## 2021-03-07 MED ORDER — MUPIROCIN 2 % EX OINT
TOPICAL_OINTMENT | CUTANEOUS | 2 refills | Status: DC
  Filled 2021-03-07: qty 22, 30d supply, fill #0
  Filled 2021-11-17: qty 22, 30d supply, fill #1
  Filled 2022-01-31: qty 22, 7d supply, fill #2

## 2021-03-08 ENCOUNTER — Other Ambulatory Visit: Payer: Self-pay

## 2021-03-09 ENCOUNTER — Other Ambulatory Visit: Payer: Self-pay

## 2021-03-20 DIAGNOSIS — R748 Abnormal levels of other serum enzymes: Secondary | ICD-10-CM

## 2021-03-20 HISTORY — DX: Abnormal levels of other serum enzymes: R74.8

## 2021-03-22 ENCOUNTER — Other Ambulatory Visit: Payer: Self-pay

## 2021-03-24 ENCOUNTER — Encounter: Payer: Self-pay | Admitting: Internal Medicine

## 2021-03-28 MED ORDER — TRAMADOL HCL 50 MG PO TABS: 100.0000 mg | ORAL_TABLET | Freq: Four times a day (QID) | ORAL | 5 refills | Status: DC | PRN

## 2021-03-28 MED ORDER — ALPRAZOLAM 1 MG PO TABS: 1.5000 mg | ORAL_TABLET | Freq: Every evening | ORAL | 5 refills | Status: DC | PRN

## 2021-03-28 MED ORDER — TIZANIDINE HCL 4 MG PO TABS: 4.0000 mg | ORAL_TABLET | Freq: Four times a day (QID) | ORAL | 5 refills | Status: DC | PRN

## 2021-04-21 ENCOUNTER — Encounter: Payer: Self-pay | Admitting: Internal Medicine

## 2021-04-22 MED ORDER — ALPRAZOLAM 1 MG PO TABS: 1.5000 mg | ORAL_TABLET | Freq: Every evening | ORAL | 0 refills | Status: DC | PRN

## 2021-05-03 ENCOUNTER — Other Ambulatory Visit: Payer: Self-pay | Admitting: Internal Medicine

## 2021-05-03 ENCOUNTER — Other Ambulatory Visit: Payer: Self-pay

## 2021-05-03 DIAGNOSIS — R109 Unspecified abdominal pain: Secondary | ICD-10-CM

## 2021-05-03 MED FILL — Ondansetron HCl Tab 8 MG: ORAL | 30 days supply | Qty: 90 | Fill #3 | Status: CN

## 2021-05-06 ENCOUNTER — Other Ambulatory Visit: Payer: Self-pay | Admitting: Internal Medicine

## 2021-05-06 ENCOUNTER — Other Ambulatory Visit: Payer: Self-pay

## 2021-05-06 DIAGNOSIS — R109 Unspecified abdominal pain: Secondary | ICD-10-CM

## 2021-05-06 MED ORDER — CYANOCOBALAMIN 1000 MCG/ML IJ SOLN
INTRAMUSCULAR | 2 refills | Status: DC
  Filled 2021-05-06: qty 3, 90d supply, fill #0
  Filled 2021-09-15: qty 3, 90d supply, fill #1
  Filled 2022-01-19: qty 3, 90d supply, fill #2

## 2021-05-06 MED ORDER — OMEPRAZOLE 40 MG PO CPDR
DELAYED_RELEASE_CAPSULE | ORAL | 3 refills | Status: DC
  Filled 2021-05-06: qty 180, 90d supply, fill #0
  Filled 2021-09-19: qty 180, 90d supply, fill #1
  Filled 2021-12-29: qty 180, 90d supply, fill #2

## 2021-05-06 MED ORDER — MONTELUKAST SODIUM 10 MG PO TABS
ORAL_TABLET | Freq: Every day | ORAL | 3 refills | Status: DC
  Filled 2021-05-06: qty 90, 90d supply, fill #0
  Filled 2022-01-28: qty 90, 90d supply, fill #1

## 2021-05-06 MED ORDER — DICYCLOMINE HCL 10 MG PO CAPS
10.0000 mg | ORAL_CAPSULE | Freq: Three times a day (TID) | ORAL | 0 refills | Status: DC
  Filled 2021-05-06: qty 120, 30d supply, fill #0

## 2021-05-06 MED FILL — Ondansetron HCl Tab 8 MG: ORAL | 30 days supply | Qty: 90 | Fill #3 | Status: AC

## 2021-05-09 ENCOUNTER — Other Ambulatory Visit: Payer: Self-pay

## 2021-05-10 ENCOUNTER — Other Ambulatory Visit: Payer: Self-pay

## 2021-05-11 ENCOUNTER — Other Ambulatory Visit: Payer: Self-pay

## 2021-05-11 ENCOUNTER — Other Ambulatory Visit: Payer: Self-pay | Admitting: Internal Medicine

## 2021-05-11 MED ORDER — LIDOCAINE-PRILOCAINE 2.5-2.5 % EX CREA
TOPICAL_CREAM | CUTANEOUS | 3 refills | Status: DC
  Filled 2021-05-11: qty 30, 20d supply, fill #0
  Filled 2021-09-19: qty 30, 20d supply, fill #1
  Filled 2022-01-19: qty 30, 20d supply, fill #2
  Filled 2022-01-28 – 2022-02-07 (×2): qty 30, 20d supply, fill #3

## 2021-05-12 ENCOUNTER — Other Ambulatory Visit: Payer: Self-pay

## 2021-05-23 ENCOUNTER — Encounter: Payer: Self-pay | Admitting: Internal Medicine

## 2021-05-24 ENCOUNTER — Other Ambulatory Visit: Payer: Self-pay | Admitting: Internal Medicine

## 2021-05-24 MED ORDER — ALPRAZOLAM 1 MG PO TABS: 1.5000 mg | ORAL_TABLET | Freq: Every evening | ORAL | 0 refills | Status: DC | PRN

## 2021-05-30 ENCOUNTER — Encounter: Payer: 59 | Admitting: Internal Medicine

## 2021-06-01 ENCOUNTER — Telehealth: Payer: 59 | Admitting: Internal Medicine

## 2021-06-07 ENCOUNTER — Telehealth: Payer: 59 | Admitting: Internal Medicine

## 2021-06-08 ENCOUNTER — Encounter: Payer: 59 | Admitting: Internal Medicine

## 2021-06-13 ENCOUNTER — Telehealth: Payer: 59 | Admitting: Internal Medicine

## 2021-06-15 ENCOUNTER — Encounter: Payer: 59 | Admitting: Internal Medicine

## 2021-06-17 ENCOUNTER — Encounter: Payer: Self-pay | Admitting: Internal Medicine

## 2021-06-17 ENCOUNTER — Telehealth: Payer: 59 | Admitting: Internal Medicine

## 2021-06-17 DIAGNOSIS — F411 Generalized anxiety disorder: Secondary | ICD-10-CM

## 2021-06-17 DIAGNOSIS — F331 Major depressive disorder, recurrent, moderate: Secondary | ICD-10-CM

## 2021-06-17 DIAGNOSIS — Z6841 Body Mass Index (BMI) 40.0 and over, adult: Secondary | ICD-10-CM | POA: Diagnosis not present

## 2021-06-17 MED ORDER — ALPRAZOLAM 1 MG PO TABS: 1.5000 mg | ORAL_TABLET | Freq: Every evening | ORAL | 0 refills | Status: DC | PRN

## 2021-06-17 MED ORDER — TRAMADOL HCL 50 MG PO TABS: 100.0000 mg | ORAL_TABLET | Freq: Four times a day (QID) | ORAL | 5 refills | Status: DC | PRN

## 2021-06-17 NOTE — Progress Notes (Signed)
Virtual Visit via Caregilty Note ? ?This visit type was conducted due to national recommendations for restrictions regarding the COVID-19 pandemic (e.g. social distancing).  This format is felt to be most appropriate for this patient at this time.  All issues noted in this document were discussed and addressed.  No physical exam was performed (except for noted visual exam findings with Video Visits).  ? ?I connected withNAME@ on 06/17/21 at  4:30 PM EDT by a video enabled telemedicine application or telephone and verified that I am speaking with the correct person using two identifiers. ?Location patient: home ?Location provider: work or home office ?Persons participating in the virtual visit: patient, provider ? ?I discussed the limitations, risks, security and privacy concerns of performing an evaluation and management service by telephone and the availability of in person appointments. I also discussed with the patient that there may be a patient responsible charge related to this service. The patient expressed understanding and agreed to proceed. ? ?Reason for visit: GAD ? ?HPI: ? ?41 yr old female with  ? ? ?1)  GAD,  long history of benzodiazepine use for management due to history of SSRI intolerance/lack of perceived effect.   Self weaned from clonazepam 1 year ago,  and  Currently taking lexapro 20 mg daily , started last year to manage mild depressive symptoms  and 1.0 to 1.5 mg alprazolam daily   . With recent request to increase to 1 mg bid, which was denied  ? ?She is currently living in [place] with her parents after leaving [facility].  She had a difficult last year and was experiencing burnout.   ? ?She feels generally better since leaving,  but admits that she has days of increased anxiety about her future and it was on one of those days that she emailed her request.    ? ?2) Obesity:  still taking ozempic ,  currently using 1.5 mg every 2 weeks and still losing weight  currently at 190 lbs ,  wants to lose  20-30 more.  ? ?ROS: See pertinent positives and negatives per HPI. ? ?Past Medical History:  ?Diagnosis Date  ? Abnormal Pap smear of cervix 2012  ? Abortion 2001  ? Allergy   ? Anxiety   ? panic disorder  ? Arthritis   ? Asthma   ? Gastritis 2005  ? Hyperlipidemia   ? Kidney stone   ? multiple  ? Migraines   ? Obesity   ? Panic disorder   ? Vitamin D deficiency disease   ? ? ?Past Surgical History:  ?Procedure Laterality Date  ? BREAST BIOPSY Right 02/04/2001  ? Negative c Clip  ? CHOLECYSTECTOMY  04/08/2012  ? COLONOSCOPY WITH PROPOFOL N/A 07/12/2017  ? Procedure: COLONOSCOPY WITH PROPOFOL;  Surgeon: Lin Landsman, MD;  Location: [facility] ENDOSCOPY;  Service: Gastroenterology;  Laterality: N/A;  ? COLPOSCOPY  2012  ? ESOPHAGOGASTRODUODENOSCOPY (EGD) WITH PROPOFOL N/A 07/12/2017  ? Procedure: ESOPHAGOGASTRODUODENOSCOPY (EGD) WITH PROPOFOL;  Surgeon: Lin Landsman, MD;  Location: [facility] ENDOSCOPY;  Service: Gastroenterology;  Laterality: N/A;  ? KNEE ARTHROSCOPY Left 05/01/2016  ? Procedure: ARTHROSCOPY KNEE LATERAL RELEASE;  Surgeon: Hessie Knows, MD;  Location: [facility] ORS;  Service: Orthopedics;  Laterality: Left;  ? laprascopic sleeve  04/08/2012  ? GASTRIC  ? SPINAL FUSION  09/25/2016  ? ? ?Family History  ?Problem Relation Age of Onset  ? Cancer Maternal Grandmother   ?     LUNG, THROAT  ? Macular degeneration Maternal Grandmother   ?  Heart disease Paternal Grandmother   ? Alzheimer's disease Paternal Grandmother   ? Stroke Paternal Grandmother   ? Cancer Paternal Grandfather   ?     COLON  ? Diabetes Father   ? Heart disease Father   ?     atrial fib  ? Hyperlipidemia Father   ? Hypertension Father   ? Cancer Father 62  ?     colon polyps  ? Colon polyps Father   ? ? ?SOCIAL HX:  reports that she has never smoked. She has never used smokeless tobacco. She reports current alcohol use of about 2.0 standard drinks per week. She reports that she does not use drugs.  ? ? ?Current Outpatient Medications:  ?   albuterol (VENTOLIN HFA) 108 (90 Base) MCG/ACT inhaler, Inhale 2 puffs into the lungs every 4 (four) hours as needed for wheezing or shortness of breath., Disp: 18 g, Rfl: 5 ?  azelastine (ASTELIN) 0.1 % nasal spray, PLACE 2 SPRAYS INTO BOTH NOSTRILS 2 TIMES DAILY AS DIRECTED, Disp: 30 mL, Rfl: 9 ?  bimatoprost (LATISSE) 0.03 % ophthalmic solution, APPLY 1 DROP TO UPPER EYE LID AT BEDTIME AS DIRECTED, Disp: 5 mL, Rfl: 6 ?  Clindamycin-Benzoyl Per, Refr, gel, APPLY TO THE AFFECTED AREA(S) DAILY, Disp: 45 g, Rfl: 11 ?  cyanocobalamin (,VITAMIN B-12,) 1000 MCG/ML injection, INJECT 1 MILLILITER (ML) INTO THE MUSCLE EVERY THIRTY DAYS, Disp: 3 mL, Rfl: 2 ?  desloratadine (CLARINEX) 5 MG tablet, TAKE 1 TABLET (5 MG TOTAL) BY MOUTH DAILY., Disp: 90 tablet, Rfl: 3 ?  dicyclomine (BENTYL) 10 MG capsule, Take 1 capsule (10 mg total) by mouth 4 (four) times daily -  before meals and at bedtime., Disp: 120 capsule, Rfl: 0 ?  escitalopram (LEXAPRO) 20 MG tablet, Take 1 tablet (20 mg total) by mouth daily., Disp: 90 tablet, Rfl: 1 ?  lidocaine-prilocaine (EMLA) cream, APPLY TOPICALLY AS NEEDED., Disp: 30 g, Rfl: 3 ?  mometasone (NASONEX) 50 MCG/ACT nasal spray, PLACE 2 SPRAYS INTO THE NOSE DAILY., Disp: 17 g, Rfl: 9 ?  montelukast (SINGULAIR) 10 MG tablet, TAKE 1 TABLET BY MOUTH AT BEDTIME., Disp: 90 tablet, Rfl: 3 ?  Multiple Vitamins-Minerals (CENTRUM WOMEN PO), , Disp: , Rfl:  ?  mupirocin ointment (BACTROBAN) 2 %, Apply to affected area daily, Disp: 22 g, Rfl: 2 ?  omeprazole (PRILOSEC) 40 MG capsule, TAKE 1 CAPSULE BY MOUTH 2 TIMES DAILY BEFORE A MEAL., Disp: 180 capsule, Rfl: 3 ?  ondansetron (ZOFRAN) 8 MG tablet, TAKE 1 TABLET BY MOUTH EVERY 8 HOURS AS NEEDED, Disp: 90 tablet, Rfl: 4 ?  propranolol (INDERAL) 10 MG tablet, Take 1 tablet (10 mg total) by mouth 3 (three) times daily., Disp: 90 tablet, Rfl: 5 ?  propranolol ER (INDERAL LA) 120 MG 24 hr capsule, TAKE 1 CAPSULE BY MOUTH DAILY., Disp: 90 capsule, Rfl: 3 ?   Semaglutide, 1 MG/DOSE, (OZEMPIC, 1 MG/DOSE,) 4 MG/3ML SOPN, Inject 1 mg into the skin once a week., Disp: 3 mL, Rfl: 2 ?  SUMAtriptan (IMITREX) 100 MG tablet, TAKE 1 TABLET BY MOUTH DAILY AS NEEDED., Disp: 9 tablet, Rfl: 3 ?  tiZANidine (ZANAFLEX) 4 MG tablet, Take 1 tablet (4 mg total) by mouth every 6 (six) hours as needed for spasms, Disp: 90 tablet, Rfl: 5 ?  Vitamin D, Ergocalciferol, (DRISDOL) 1.25 MG (50000 UNIT) CAPS capsule, TAKE 1 CAPSULE BY MOUTH EVERY 7 DAYS ON SUNDAYS, Disp: 12 capsule, Rfl: 3 ?  ALPRAZolam (XANAX) 1 MG tablet,  Take 1.5 tablets (1.5 mg total) by mouth at bedtime as needed for anxiety., Disp: 45 tablet, Rfl: 0 ?  traMADol (ULTRAM) 50 MG tablet, Take 2 tablets (100 mg total) by mouth every 6 (six) hours as needed., Disp: 120 tablet, Rfl: 5 ? ?EXAM: ? ?VITALS per patient if applicable: ? ?GENERAL: alert, oriented, appears well and in no acute distress ? ?HEENT: atraumatic, conjunttiva clear, no obvious abnormalities on inspection of external nose and ears ? ?NECK: normal movements of the head and neck ? ?LUNGS: on inspection no signs of respiratory distress, breathing rate appears normal, no obvious gross SOB, gasping or wheezing ? ?CV: no obvious cyanosis ? ?MS: moves all visible extremities without noticeable abnormality ? ?PSYCH/NEURO: pleasant and cooperative, no obvious depression or anxiety, speech and thought processing grossly intact ? ?ASSESSMENT AND PLAN: ? ?Discussed the following assessment and plan: ? ?Moderate episode of recurrent major depressive disorder ([place]) ? ?Generalized anxiety disorder ? ?Class 3 severe obesity due to excess calories without serious comorbidity with body mass index (BMI) of 40.0 to 44.9 in adult [facility]) ? ?Major depressive disorder, recurrent ([place]) ?Not suicidal.  Lack of motivation,  Lack of optimsm.  Improved with lexapro . No changes today  ? ?Generalized anxiety disorder ? Did not tolerate buspirone .  Tolerating lexapro at 20 mg daily . Using #45  alprazolam per month: 1 mg at bedtime, some days takes one during the day prn . The risks and benefits of benzodiazepine use were reviewed with patient today including excessive sedation leading to respiratory depres

## 2021-06-17 NOTE — Assessment & Plan Note (Signed)
Not suicidal.  Lack of motivation,  Lack of optimsm.  Improved with lexapro . No changes today  ?

## 2021-06-17 NOTE — Assessment & Plan Note (Signed)
Did not tolerate buspirone .  Tolerating lexapro at 20 mg daily . Using #45 alprazolam per month: 1 mg at bedtime, some days takes one during the day prn . The risks and benefits of benzodiazepine use were reviewed with patient today including excessive sedation leading to respiratory depression,  impaired thinking/driving, and addiction.  Patient was advised to avoid concurrent use with alcohol, to use medication only as needed and not to share with others  . Last refill was on March 9, at [place].  Refill for April sent to [facility] in [place] per patient's request,  For refill on or after April 9 ?

## 2021-06-17 NOTE — Assessment & Plan Note (Addendum)
She continues to lose weight on semiglutide, and has had to reduce dosing to every 14 days to avoid overly rapid weight loss.  Current weight is 190 lbs down 32 lbs since August .   Total weight loss 60 lbs   Since March 2022.  Goal is 175 lbs  ?

## 2021-07-20 ENCOUNTER — Other Ambulatory Visit: Payer: Self-pay | Admitting: Internal Medicine

## 2021-07-21 ENCOUNTER — Telehealth: Payer: Self-pay | Admitting: Internal Medicine

## 2021-07-21 MED ORDER — ALPRAZOLAM 1 MG PO TABS: 1.5000 mg | ORAL_TABLET | Freq: Every evening | ORAL | 0 refills | Status: DC | PRN

## 2021-08-16 ENCOUNTER — Other Ambulatory Visit: Payer: Self-pay | Admitting: Internal Medicine

## 2021-08-18 ENCOUNTER — Encounter: Payer: Self-pay | Admitting: Internal Medicine

## 2021-08-18 MED ORDER — ESCITALOPRAM OXALATE 20 MG PO TABS: 20.0000 mg | ORAL_TABLET | Freq: Every day | ORAL | 1 refills | Status: DC

## 2021-08-18 MED ORDER — PROPRANOLOL HCL 10 MG PO TABS: 10.0000 mg | ORAL_TABLET | Freq: Three times a day (TID) | ORAL | 5 refills | Status: DC

## 2021-08-29 ENCOUNTER — Encounter: Payer: Self-pay | Admitting: Internal Medicine

## 2021-08-30 ENCOUNTER — Other Ambulatory Visit: Payer: Self-pay

## 2021-08-30 MED ORDER — ALPRAZOLAM 1 MG PO TABS: 1.5000 mg | ORAL_TABLET | Freq: Every evening | ORAL | 0 refills | Status: DC | PRN

## 2021-08-30 MED ORDER — TRAMADOL HCL 50 MG PO TABS: 100.0000 mg | ORAL_TABLET | Freq: Four times a day (QID) | ORAL | 0 refills | Status: DC | PRN

## 2021-09-11 ENCOUNTER — Other Ambulatory Visit: Payer: Self-pay | Admitting: Internal Medicine

## 2021-09-15 ENCOUNTER — Other Ambulatory Visit (HOSPITAL_COMMUNITY): Payer: Self-pay

## 2021-09-15 ENCOUNTER — Other Ambulatory Visit: Payer: Self-pay | Admitting: Internal Medicine

## 2021-09-16 ENCOUNTER — Other Ambulatory Visit (HOSPITAL_COMMUNITY): Payer: Self-pay

## 2021-09-16 MED ORDER — PROPRANOLOL HCL ER 120 MG PO CP24
ORAL_CAPSULE | Freq: Every day | ORAL | 3 refills | Status: DC
Start: 2021-09-16 — End: 2021-09-26
  Filled 2021-09-16: qty 90, 90d supply, fill #0

## 2021-09-19 ENCOUNTER — Other Ambulatory Visit (HOSPITAL_COMMUNITY): Payer: Self-pay

## 2021-09-19 ENCOUNTER — Encounter (HOSPITAL_COMMUNITY): Payer: Self-pay | Admitting: Pharmacist

## 2021-09-19 ENCOUNTER — Other Ambulatory Visit: Payer: Self-pay | Admitting: Internal Medicine

## 2021-09-19 MED ORDER — MOMETASONE FUROATE 50 MCG/ACT NA SUSP
2.0000 | Freq: Every day | NASAL | 9 refills | Status: DC
  Filled 2021-09-19 – 2022-01-28 (×2): qty 17, 30d supply, fill #0

## 2021-09-19 MED ORDER — AZELASTINE HCL 0.1 % NA SOLN
NASAL | 9 refills | Status: DC
  Filled 2021-09-19: qty 30, 30d supply, fill #0
  Filled 2022-01-28: qty 30, 30d supply, fill #1

## 2021-09-19 MED FILL — Ondansetron HCl Tab 8 MG: ORAL | 30 days supply | Qty: 90 | Fill #4 | Status: AC

## 2021-09-21 ENCOUNTER — Other Ambulatory Visit (HOSPITAL_COMMUNITY): Payer: Self-pay

## 2021-09-25 ENCOUNTER — Encounter: Payer: Self-pay | Admitting: Internal Medicine

## 2021-09-26 ENCOUNTER — Other Ambulatory Visit: Payer: Self-pay | Admitting: Internal Medicine

## 2021-09-26 ENCOUNTER — Other Ambulatory Visit (HOSPITAL_COMMUNITY): Payer: Self-pay

## 2021-09-26 MED ORDER — PROPRANOLOL HCL ER 120 MG PO CP24
ORAL_CAPSULE | Freq: Every day | ORAL | 3 refills | Status: DC
  Filled 2021-09-26: qty 90, fill #0

## 2021-09-26 MED ORDER — ALPRAZOLAM 1 MG PO TABS
1.5000 mg | ORAL_TABLET | Freq: Every evening | ORAL | 0 refills | Status: DC | PRN
Start: 2021-09-26 — End: 2021-10-27
  Filled 2021-09-26 – 2021-09-29 (×2): qty 45, 30d supply, fill #0

## 2021-09-26 MED ORDER — PROPRANOLOL HCL 10 MG PO TABS
10.0000 mg | ORAL_TABLET | Freq: Three times a day (TID) | ORAL | 2 refills | Status: DC
  Filled 2021-09-26: qty 270, 90d supply, fill #0
  Filled 2021-12-16: qty 270, 90d supply, fill #1

## 2021-09-26 MED ORDER — SEMAGLUTIDE (2 MG/DOSE) 8 MG/3ML ~~LOC~~ SOPN
2.0000 mg | PEN_INJECTOR | SUBCUTANEOUS | 2 refills | Status: DC
  Filled 2021-09-26: qty 3, 28d supply, fill #0

## 2021-09-26 MED ORDER — TIZANIDINE HCL 4 MG PO TABS
ORAL_TABLET | ORAL | 5 refills | Status: DC
Start: 2021-09-26 — End: 2022-02-08
  Filled 2021-09-26: qty 90, 22d supply, fill #0
  Filled 2021-10-26: qty 90, 22d supply, fill #1
  Filled 2021-11-24: qty 90, 22d supply, fill #2
  Filled 2021-12-11 – 2021-12-15 (×2): qty 90, 22d supply, fill #3
  Filled 2021-12-29 – 2022-01-02 (×2): qty 90, 22d supply, fill #4
  Filled 2022-01-31: qty 90, 22d supply, fill #5

## 2021-09-27 ENCOUNTER — Other Ambulatory Visit: Payer: Self-pay | Admitting: Internal Medicine

## 2021-09-28 ENCOUNTER — Other Ambulatory Visit (HOSPITAL_COMMUNITY): Payer: Self-pay

## 2021-09-29 ENCOUNTER — Other Ambulatory Visit (HOSPITAL_COMMUNITY): Payer: Self-pay

## 2021-09-29 MED ORDER — TRAMADOL HCL 50 MG PO TABS
100.0000 mg | ORAL_TABLET | Freq: Four times a day (QID) | ORAL | 0 refills | Status: DC | PRN
  Filled 2021-09-29: qty 120, 15d supply, fill #0

## 2021-10-03 ENCOUNTER — Telehealth: Payer: Self-pay

## 2021-10-03 NOTE — Telephone Encounter (Signed)
PA for Ozempic has been submitted on covermymeds.  

## 2021-10-26 ENCOUNTER — Other Ambulatory Visit (HOSPITAL_COMMUNITY): Payer: Self-pay

## 2021-10-27 ENCOUNTER — Other Ambulatory Visit (HOSPITAL_COMMUNITY): Payer: Self-pay

## 2021-10-27 ENCOUNTER — Other Ambulatory Visit: Payer: Self-pay | Admitting: Internal Medicine

## 2021-10-27 MED ORDER — TRAMADOL HCL 50 MG PO TABS
100.0000 mg | ORAL_TABLET | Freq: Four times a day (QID) | ORAL | 0 refills | Status: DC | PRN
  Filled 2021-10-27: qty 120, 30d supply, fill #0

## 2021-10-27 MED ORDER — ALPRAZOLAM 1 MG PO TABS
1.5000 mg | ORAL_TABLET | Freq: Every evening | ORAL | 0 refills | Status: DC | PRN
  Filled 2021-10-27: qty 45, 30d supply, fill #0

## 2021-10-27 NOTE — Telephone Encounter (Signed)
Tramadol    Refilled: 09/29/2021 Alprazolam   Refilled: 09/26/2021  Last OV: 06/17/2021 Next OV: 11/14/2021

## 2021-11-03 ENCOUNTER — Other Ambulatory Visit: Payer: Self-pay

## 2021-11-04 ENCOUNTER — Other Ambulatory Visit (HOSPITAL_COMMUNITY): Payer: Self-pay

## 2021-11-07 ENCOUNTER — Encounter: Payer: Self-pay | Admitting: Internal Medicine

## 2021-11-09 ENCOUNTER — Telehealth: Payer: 59 | Admitting: Physician Assistant

## 2021-11-09 ENCOUNTER — Other Ambulatory Visit (HOSPITAL_COMMUNITY): Payer: Self-pay

## 2021-11-09 ENCOUNTER — Other Ambulatory Visit: Payer: Self-pay | Admitting: Internal Medicine

## 2021-11-09 DIAGNOSIS — R3989 Other symptoms and signs involving the genitourinary system: Secondary | ICD-10-CM | POA: Diagnosis not present

## 2021-11-09 MED ORDER — PHENAZOPYRIDINE HCL 100 MG PO TABS: 100.0000 mg | ORAL_TABLET | Freq: Three times a day (TID) | ORAL | 0 refills | Status: DC | PRN

## 2021-11-09 MED ORDER — SEMAGLUTIDE-WEIGHT MANAGEMENT 2.4 MG/0.75ML ~~LOC~~ SOAJ
2.4000 mg | SUBCUTANEOUS | 2 refills | Status: DC
  Filled 2021-11-09: qty 3, 28d supply, fill #0

## 2021-11-09 MED ORDER — SULFAMETHOXAZOLE-TRIMETHOPRIM 800-160 MG PO TABS: 1.0000 | ORAL_TABLET | Freq: Two times a day (BID) | ORAL | 0 refills | Status: DC

## 2021-11-09 NOTE — Progress Notes (Signed)
Virtual Visit Consent   Tammy Christian, you are scheduled for a virtual visit with a [place] provider today. Just as with appointments in the office, your consent must be obtained to participate. Your consent will be active for this visit and any virtual visit you may have with one of our providers in the next 365 days. If you have a MyChart account, a copy of this consent can be sent to you electronically.  As this is a virtual visit, video technology does not allow for your provider to perform a traditional examination. This may limit your provider's ability to fully assess your condition. If your provider identifies any concerns that need to be evaluated in person or the need to arrange testing (such as labs, EKG, etc.), we will make arrangements to do so. Although advances in technology are sophisticated, we cannot ensure that it will always work on either your end or our end. If the connection with a video visit is poor, the visit may have to be switched to a telephone visit. With either a video or telephone visit, we are not always able to ensure that we have a secure connection.  By engaging in this virtual visit, you consent to the provision of healthcare and authorize for your insurance to be billed (if applicable) for the services provided during this visit. Depending on your insurance coverage, you may receive a charge related to this service.  I need to obtain your verbal consent now. Are you willing to proceed with your visit today? KRISTEN FROMM has provided verbal consent on 11/09/2021 for a virtual visit (video or telephone). Mar Daring, PA-C  Date: 11/09/2021 9:30 AM  Virtual Visit via Video Note   I, Mar Daring, connected with  JASARA CORRIGAN  ([id number], 1980-12-20) on 11/09/21 at 10:30 AM EDT by a video-enabled telemedicine application and verified that I am speaking with the correct person using two identifiers.  Location: Patient:  Virtual Visit Location Patient: Home Provider: Virtual Visit Location Provider: Home Office   I discussed the limitations of evaluation and management by telemedicine and the availability of in person appointments. The patient expressed understanding and agreed to proceed.    History of Present Illness: Tammy Christian is a 41 y.o. who identifies as a female who was assigned female at birth, and is being seen today for possible UTI.  HPI: Urinary Tract Infection  This is a new problem. The problem occurs every urination. The problem has been gradually worsening. The quality of the pain is described as aching and burning. The pain is mild. There has been no fever. There is No history of pyelonephritis. Associated symptoms include hesitancy and urgency. Pertinent negatives include no chills, flank pain, frequency, hematuria or nausea. She has tried acetaminophen and increased fluids for the symptoms. The treatment provided no relief. Her past medical history is significant for kidney stones. There is no history of recurrent UTIs.  At home urine test was positive for nitrites and leukocytes.   Problems:  Patient Active Problem List   Diagnosis Date Noted   Elevated liver enzymes 02/07/2021   Major depressive disorder, recurrent ([place]) 08/23/2020   Menometrorrhagia 05/24/2020   Diarrhea in adult patient 05/24/2020   B12 deficiency 05/22/2019   Cervical radiculopathy due to degenerative joint disease of spine 10/28/2017   Low back pain potentially associated with radiculopathy 10/28/2017   Ovarian cyst, left 07/02/2017   Gastritis 06/07/2017   Iron deficiency anemia 05/15/2017   Lymphedema  of leg 09/21/2016   Sciatica of left side associated with disorder of lumbosacral spine 04/02/2016   Vitamin D deficiency 05/01/2014   History of migraine 04/29/2014   Obesity 04/29/2014   Encounter for preventive health examination 04/29/2014   Generalized anxiety disorder 04/28/2014   Seasonal  allergies 05/09/2013   Environmental allergies 05/09/2013   S/P laparoscopic sleeve gastrectomy 04/08/2012    Allergies: No Known Allergies Medications:  Current Outpatient Medications:    phenazopyridine (PYRIDIUM) 100 MG tablet, Take 1 tablet (100 mg total) by mouth 3 (three) times daily as needed for pain., Disp: 10 tablet, Rfl: 0   sulfamethoxazole-trimethoprim (BACTRIM DS) 800-160 MG tablet, Take 1 tablet by mouth 2 (two) times daily., Disp: 10 tablet, Rfl: 0   albuterol (VENTOLIN HFA) 108 (90 Base) MCG/ACT inhaler, Inhale 2 puffs into the lungs every 4 (four) hours as needed for wheezing or shortness of breath., Disp: 18 g, Rfl: 5   ALPRAZolam (XANAX) 1 MG tablet, Take 1 and 1/2 tablets by mouth at bedtime as needed for anxiety. FOR REFILL ON OR AFTER JULY 13., Disp: 45 tablet, Rfl: 0   azelastine (ASTELIN) 0.1 % nasal spray, PLACE 2 SPRAYS INTO BOTH NOSTRILS 2 TIMES DAILY AS DIRECTED, Disp: 30 mL, Rfl: 9   bimatoprost (LATISSE) 0.03 % ophthalmic solution, APPLY 1 DROP TO UPPER EYE LID AT BEDTIME AS DIRECTED, Disp: 5 mL, Rfl: 6   Clindamycin-Benzoyl Per, Refr, gel, APPLY TO THE AFFECTED AREA(S) DAILY, Disp: 45 g, Rfl: 11   cyanocobalamin (,VITAMIN B-12,) 1000 MCG/ML injection, INJECT 1 MILLILITER (ML) INTO THE MUSCLE EVERY THIRTY DAYS, Disp: 3 mL, Rfl: 2   desloratadine (CLARINEX) 5 MG tablet, TAKE 1 TABLET (5 MG TOTAL) BY MOUTH DAILY., Disp: 90 tablet, Rfl: 3   dicyclomine (BENTYL) 10 MG capsule, Take 1 capsule (10 mg total) by mouth 4 (four) times daily -  before meals and at bedtime., Disp: 120 capsule, Rfl: 0   escitalopram (LEXAPRO) 20 MG tablet, Take 1 tablet (20 mg total) by mouth daily., Disp: 90 tablet, Rfl: 1   lidocaine-prilocaine (EMLA) cream, APPLY TOPICALLY AS NEEDED., Disp: 30 g, Rfl: 3   mometasone (NASONEX) 50 MCG/ACT nasal spray, PLACE 2 SPRAYS INTO THE NOSE DAILY., Disp: 17 g, Rfl: 9   montelukast (SINGULAIR) 10 MG tablet, TAKE 1 TABLET BY MOUTH AT BEDTIME., Disp: 90  tablet, Rfl: 3   Multiple Vitamins-Minerals (CENTRUM WOMEN PO), , Disp: , Rfl:    mupirocin ointment (BACTROBAN) 2 %, Apply to affected area daily, Disp: 22 g, Rfl: 2   omeprazole (PRILOSEC) 40 MG capsule, TAKE 1 CAPSULE BY MOUTH 2 TIMES DAILY BEFORE A MEAL., Disp: 180 capsule, Rfl: 3   ondansetron (ZOFRAN) 8 MG tablet, TAKE 1 TABLET BY MOUTH EVERY 8 HOURS AS NEEDED, Disp: 90 tablet, Rfl: 4   propranolol (INDERAL) 10 MG tablet, Take 1 tablet (10 mg total) by mouth 3 (three) times daily., Disp: 270 tablet, Rfl: 2   Semaglutide, 2 MG/DOSE, 8 MG/3ML SOPN, Inject 2 mg as directed once a week., Disp: 3 mL, Rfl: 2   [START ON 03/05/2022] Semaglutide-Weight Management 2.4 MG/0.75ML SOAJ, Inject 2.4 mg into the skin once a week for 28 days., Disp: 3 mL, Rfl: 2   SUMAtriptan (IMITREX) 100 MG tablet, TAKE 1 TABLET BY MOUTH DAILY AS NEEDED., Disp: 9 tablet, Rfl: 3   tiZANidine (ZANAFLEX) 4 MG tablet, TAKE 1 TABLET BY MOUTH EVERY 6 HOURS AS NEEDED FOR SPASMS, Disp: 90 tablet, Rfl: 5   traMADol (  ULTRAM) 50 MG tablet, Take 2 tablets (100 mg total) by mouth every 6 (six) hours as needed., Disp: 120 tablet, Rfl: 0   Vitamin D, Ergocalciferol, (DRISDOL) 1.25 MG (50000 UNIT) CAPS capsule, TAKE 1 CAPSULE BY MOUTH EVERY 7 DAYS ON SUNDAYS, Disp: 12 capsule, Rfl: 3  Observations/Objective: Patient is well-developed, well-nourished in no acute distress.  Resting comfortably at home.  Head is normocephalic, atraumatic.  No labored breathing.  Speech is clear and coherent with logical content.  Patient is alert and oriented at baseline.    Assessment and Plan: 1. Suspected UTI - sulfamethoxazole-trimethoprim (BACTRIM DS) 800-160 MG tablet; Take 1 tablet by mouth 2 (two) times daily.  Dispense: 10 tablet; Refill: 0 - phenazopyridine (PYRIDIUM) 100 MG tablet; Take 1 tablet (100 mg total) by mouth 3 (three) times daily as needed for pain.  Dispense: 10 tablet; Refill: 0  - Worsening symptoms.  - Will treat  empirically with Bactrim - May use Pyridium for bladder spasms - Continue to push fluids.  - Seek in person evaluation for urine culture if symptoms do not improve or if they worsen.    Follow Up Instructions: I discussed the assessment and treatment plan with the patient. The patient was provided an opportunity to ask questions and all were answered. The patient agreed with the plan and demonstrated an understanding of the instructions.  A copy of instructions were sent to the patient via MyChart unless otherwise noted below.    The patient was advised to call back or seek an in-person evaluation if the symptoms worsen or if the condition fails to improve as anticipated.  Time:  I spent 8 minutes with the patient via telehealth technology discussing the above problems/concerns.    Mar Daring, PA-C

## 2021-11-09 NOTE — Telephone Encounter (Signed)
I called patient to offer her to leave urine to a VV tomorrow Mable Paris, FNP. She declined bc she said that he had a physical on Monday. I asked if she could come leave her urine today & she said "no". I informed her we could not treat without urine specimen & she was said she was good. Pt was short & hung up the phone.

## 2021-11-09 NOTE — Patient Instructions (Signed)
Tammy Christian, thank you for joining Mar Daring, PA-C for today's virtual visit.  While this provider is not your primary care provider (PCP), if your PCP is located in our provider database this encounter information will be shared with them immediately following your visit.  Consent: (Patient) Tammy Christian provided verbal consent for this virtual visit at the beginning of the encounter.  Current Medications:  Current Outpatient Medications:    phenazopyridine (PYRIDIUM) 100 MG tablet, Take 1 tablet (100 mg total) by mouth 3 (three) times daily as needed for pain., Disp: 10 tablet, Rfl: 0   sulfamethoxazole-trimethoprim (BACTRIM DS) 800-160 MG tablet, Take 1 tablet by mouth 2 (two) times daily., Disp: 10 tablet, Rfl: 0   albuterol (VENTOLIN HFA) 108 (90 Base) MCG/ACT inhaler, Inhale 2 puffs into the lungs every 4 (four) hours as needed for wheezing or shortness of breath., Disp: 18 g, Rfl: 5   ALPRAZolam (XANAX) 1 MG tablet, Take 1 and 1/2 tablets by mouth at bedtime as needed for anxiety. FOR REFILL ON OR AFTER JULY 13., Disp: 45 tablet, Rfl: 0   azelastine (ASTELIN) 0.1 % nasal spray, PLACE 2 SPRAYS INTO BOTH NOSTRILS 2 TIMES DAILY AS DIRECTED, Disp: 30 mL, Rfl: 9   bimatoprost (LATISSE) 0.03 % ophthalmic solution, APPLY 1 DROP TO UPPER EYE LID AT BEDTIME AS DIRECTED, Disp: 5 mL, Rfl: 6   Clindamycin-Benzoyl Per, Refr, gel, APPLY TO THE AFFECTED AREA(S) DAILY, Disp: 45 g, Rfl: 11   cyanocobalamin (,VITAMIN B-12,) 1000 MCG/ML injection, INJECT 1 MILLILITER (ML) INTO THE MUSCLE EVERY THIRTY DAYS, Disp: 3 mL, Rfl: 2   desloratadine (CLARINEX) 5 MG tablet, TAKE 1 TABLET (5 MG TOTAL) BY MOUTH DAILY., Disp: 90 tablet, Rfl: 3   dicyclomine (BENTYL) 10 MG capsule, Take 1 capsule (10 mg total) by mouth 4 (four) times daily -  before meals and at bedtime., Disp: 120 capsule, Rfl: 0   escitalopram (LEXAPRO) 20 MG tablet, Take 1 tablet (20 mg total) by mouth daily., Disp: 90  tablet, Rfl: 1   lidocaine-prilocaine (EMLA) cream, APPLY TOPICALLY AS NEEDED., Disp: 30 g, Rfl: 3   mometasone (NASONEX) 50 MCG/ACT nasal spray, PLACE 2 SPRAYS INTO THE NOSE DAILY., Disp: 17 g, Rfl: 9   montelukast (SINGULAIR) 10 MG tablet, TAKE 1 TABLET BY MOUTH AT BEDTIME., Disp: 90 tablet, Rfl: 3   Multiple Vitamins-Minerals (CENTRUM WOMEN PO), , Disp: , Rfl:    mupirocin ointment (BACTROBAN) 2 %, Apply to affected area daily, Disp: 22 g, Rfl: 2   omeprazole (PRILOSEC) 40 MG capsule, TAKE 1 CAPSULE BY MOUTH 2 TIMES DAILY BEFORE A MEAL., Disp: 180 capsule, Rfl: 3   ondansetron (ZOFRAN) 8 MG tablet, TAKE 1 TABLET BY MOUTH EVERY 8 HOURS AS NEEDED, Disp: 90 tablet, Rfl: 4   propranolol (INDERAL) 10 MG tablet, Take 1 tablet (10 mg total) by mouth 3 (three) times daily., Disp: 270 tablet, Rfl: 2   Semaglutide, 2 MG/DOSE, 8 MG/3ML SOPN, Inject 2 mg as directed once a week., Disp: 3 mL, Rfl: 2   [START ON 03/05/2022] Semaglutide-Weight Management 2.4 MG/0.75ML SOAJ, Inject 2.4 mg into the skin once a week for 28 days., Disp: 3 mL, Rfl: 2   SUMAtriptan (IMITREX) 100 MG tablet, TAKE 1 TABLET BY MOUTH DAILY AS NEEDED., Disp: 9 tablet, Rfl: 3   tiZANidine (ZANAFLEX) 4 MG tablet, TAKE 1 TABLET BY MOUTH EVERY 6 HOURS AS NEEDED FOR SPASMS, Disp: 90 tablet, Rfl: 5   traMADol (ULTRAM) 50 MG  tablet, Take 2 tablets (100 mg total) by mouth every 6 (six) hours as needed., Disp: 120 tablet, Rfl: 0   Vitamin D, Ergocalciferol, (DRISDOL) 1.25 MG (50000 UNIT) CAPS capsule, TAKE 1 CAPSULE BY MOUTH EVERY 7 DAYS ON SUNDAYS, Disp: 12 capsule, Rfl: 3   Medications ordered in this encounter:  Meds ordered this encounter  Medications   sulfamethoxazole-trimethoprim (BACTRIM DS) 800-160 MG tablet    Sig: Take 1 tablet by mouth 2 (two) times daily.    Dispense:  10 tablet    Refill:  0    Order Specific Question:   Supervising Provider    Answer:   Sabra Heck, BRIAN [[id number]]   phenazopyridine (PYRIDIUM) 100 MG tablet    Sig:  Take 1 tablet (100 mg total) by mouth 3 (three) times daily as needed for pain.    Dispense:  10 tablet    Refill:  0    Order Specific Question:   Supervising Provider    Answer:   Sabra Heck, BRIAN [[id number]]     *If you need refills on other medications prior to your next appointment, please contact your pharmacy*  Follow-Up: Call back or seek an in-person evaluation if the symptoms worsen or if the condition fails to improve as anticipated.  Other Instructions Urinary Tract Infection, Adult  A urinary tract infection (UTI) is an infection of any part of the urinary tract. The urinary tract includes the kidneys, ureters, bladder, and urethra. These organs make, store, and get rid of urine in the body. An upper UTI affects the ureters and kidneys. A lower UTI affects the bladder and urethra. What are the causes? Most urinary tract infections are caused by bacteria in your genital area around your urethra, where urine leaves your body. These bacteria grow and cause inflammation of your urinary tract. What increases the risk? You are more likely to develop this condition if: You have a urinary catheter that stays in place. You are not able to control when you urinate or have a bowel movement (incontinence). You are female and you: Use a spermicide or diaphragm for birth control. Have low estrogen levels. Are pregnant. You have certain genes that increase your risk. You are sexually active. You take antibiotic medicines. You have a condition that causes your flow of urine to slow down, such as: An enlarged prostate, if you are female. Blockage in your urethra. A kidney stone. A nerve condition that affects your bladder control (neurogenic bladder). Not getting enough to drink, or not urinating often. You have certain medical conditions, such as: Diabetes. A weak disease-fighting system (immunesystem). Sickle cell disease. Gout. Spinal cord injury. What are the signs or  symptoms? Symptoms of this condition include: Needing to urinate right away (urgency). Frequent urination. This may include small amounts of urine each time you urinate. Pain or burning with urination. Blood in the urine. Urine that smells bad or unusual. Trouble urinating. Cloudy urine. Vaginal discharge, if you are female. Pain in the abdomen or the lower back. You may also have: Vomiting or a decreased appetite. Confusion. Irritability or tiredness. A fever or chills. Diarrhea. The first symptom in older adults may be confusion. In some cases, they may not have any symptoms until the infection has worsened. How is this diagnosed? This condition is diagnosed based on your medical history and a physical exam. You may also have other tests, including: Urine tests. Blood tests. Tests for STIs (sexually transmitted infections). If you have had more than one UTI, a cystoscopy  or imaging studies may be done to determine the cause of the infections. How is this treated? Treatment for this condition includes: Antibiotic medicine. Over-the-counter medicines to treat discomfort. Drinking enough water to stay hydrated. If you have frequent infections or have other conditions such as a kidney stone, you may need to see a health care provider who specializes in the urinary tract (urologist). In rare cases, urinary tract infections can cause sepsis. Sepsis is a life-threatening condition that occurs when the body responds to an infection. Sepsis is treated in the hospital with IV antibiotics, fluids, and other medicines. Follow these instructions at home:  Medicines Take over-the-counter and prescription medicines only as told by your health care provider. If you were prescribed an antibiotic medicine, take it as told by your health care provider. Do not stop using the antibiotic even if you start to feel better. General instructions Make sure you: Empty your bladder often and completely.  Do not hold urine for long periods of time. Empty your bladder after sex. Wipe from front to back after urinating or having a bowel movement if you are female. Use each tissue only one time when you wipe. Drink enough fluid to keep your urine pale yellow. Keep all follow-up visits. This is important. Contact a health care provider if: Your symptoms do not get better after 1-2 days. Your symptoms go away and then return. Get help right away if: You have severe pain in your back or your lower abdomen. You have a fever or chills. You have nausea or vomiting. Summary A urinary tract infection (UTI) is an infection of any part of the urinary tract, which includes the kidneys, ureters, bladder, and urethra. Most urinary tract infections are caused by bacteria in your genital area. Treatment for this condition often includes antibiotic medicines. If you were prescribed an antibiotic medicine, take it as told by your health care provider. Do not stop using the antibiotic even if you start to feel better. Keep all follow-up visits. This is important. This information is not intended to replace advice given to you by your health care provider. Make sure you discuss any questions you have with your health care provider. Document Revised: 10/17/2019 Document Reviewed: 10/17/2019 Elsevier Patient Education  [place].    If you have been instructed to have an in-person evaluation today at a local Urgent Care facility, please use the link below. It will take you to a list of all of our available [facility], including address, phone number and hours of operation. Please do not delay care.  [facility]  If you or a family member do not have a primary care provider, use the link below to schedule a visit and establish care. When you choose a [facility] primary care physician or advanced practice provider, you gain a long-term partner in health. Find a Primary Care  Provider  Learn more about [facility]'s in-office and virtual care options: [facility]

## 2021-11-10 ENCOUNTER — Telehealth: Payer: Self-pay

## 2021-11-10 NOTE — Telephone Encounter (Signed)
PA for Wegovy 2.4 mg has been submitted on covermymeds.

## 2021-11-14 ENCOUNTER — Encounter: Payer: 59 | Admitting: Internal Medicine

## 2021-11-14 ENCOUNTER — Other Ambulatory Visit (HOSPITAL_COMMUNITY): Payer: Self-pay

## 2021-11-17 ENCOUNTER — Other Ambulatory Visit (HOSPITAL_COMMUNITY): Payer: Self-pay

## 2021-11-17 ENCOUNTER — Encounter: Payer: Self-pay | Admitting: Internal Medicine

## 2021-11-17 ENCOUNTER — Other Ambulatory Visit: Payer: Self-pay

## 2021-11-17 MED ORDER — ESCITALOPRAM OXALATE 20 MG PO TABS
20.0000 mg | ORAL_TABLET | Freq: Every day | ORAL | 1 refills | Status: DC
  Filled 2021-11-17: qty 90, 90d supply, fill #0
  Filled 2022-01-28: qty 90, 90d supply, fill #1

## 2021-11-17 NOTE — Telephone Encounter (Signed)
PA for wegovy has been approved.

## 2021-11-19 ENCOUNTER — Other Ambulatory Visit (HOSPITAL_COMMUNITY): Payer: Self-pay

## 2021-11-22 ENCOUNTER — Other Ambulatory Visit (HOSPITAL_COMMUNITY): Payer: Self-pay

## 2021-11-23 ENCOUNTER — Other Ambulatory Visit: Payer: Self-pay | Admitting: Family

## 2021-11-24 ENCOUNTER — Encounter: Payer: Self-pay | Admitting: Internal Medicine

## 2021-11-24 ENCOUNTER — Other Ambulatory Visit (HOSPITAL_COMMUNITY): Payer: Self-pay

## 2021-11-24 MED ORDER — TRAMADOL HCL 50 MG PO TABS
100.0000 mg | ORAL_TABLET | Freq: Four times a day (QID) | ORAL | 0 refills | Status: DC | PRN
  Filled 2021-11-24: qty 120, 30d supply, fill #0

## 2021-11-24 MED ORDER — ALPRAZOLAM 1 MG PO TABS
1.5000 mg | ORAL_TABLET | Freq: Every evening | ORAL | 0 refills | Status: DC | PRN
  Filled 2021-11-24: qty 45, 30d supply, fill #0

## 2021-11-24 NOTE — Telephone Encounter (Signed)
Alprazolam   Refilled: 10/27/2021 Tramadol    Refilled: 10/27/2021  Last OV: 06/17/2021 virtual Next OV: 12/06/2021  No showed on 11/14/2021

## 2021-11-25 ENCOUNTER — Other Ambulatory Visit (HOSPITAL_COMMUNITY): Payer: Self-pay

## 2021-11-28 ENCOUNTER — Other Ambulatory Visit (HOSPITAL_COMMUNITY): Payer: Self-pay

## 2021-12-06 ENCOUNTER — Ambulatory Visit: Payer: 59 | Admitting: Internal Medicine

## 2021-12-11 ENCOUNTER — Other Ambulatory Visit: Payer: Self-pay | Admitting: Internal Medicine

## 2021-12-12 ENCOUNTER — Other Ambulatory Visit (HOSPITAL_COMMUNITY): Payer: Self-pay

## 2021-12-12 MED ORDER — SUMATRIPTAN SUCCINATE 100 MG PO TABS
ORAL_TABLET | Freq: Every day | ORAL | 3 refills | Status: DC | PRN
  Filled 2021-12-12: qty 9, 30d supply, fill #0
  Filled 2022-01-19: qty 9, 30d supply, fill #1
  Filled 2022-01-28 – 2022-05-14 (×3): qty 9, 30d supply, fill #2
  Filled 2022-05-15 – 2022-06-12 (×2): qty 9, 30d supply, fill #3

## 2021-12-15 ENCOUNTER — Other Ambulatory Visit (HOSPITAL_COMMUNITY): Payer: Self-pay

## 2021-12-16 ENCOUNTER — Other Ambulatory Visit (HOSPITAL_COMMUNITY): Payer: Self-pay

## 2021-12-16 ENCOUNTER — Other Ambulatory Visit: Payer: Self-pay | Admitting: Internal Medicine

## 2021-12-17 ENCOUNTER — Encounter (HOSPITAL_COMMUNITY): Payer: Self-pay | Admitting: Pharmacist

## 2021-12-17 ENCOUNTER — Other Ambulatory Visit (HOSPITAL_COMMUNITY): Payer: Self-pay

## 2021-12-19 ENCOUNTER — Ambulatory Visit: Payer: 59 | Admitting: Internal Medicine

## 2021-12-21 ENCOUNTER — Ambulatory Visit (INDEPENDENT_AMBULATORY_CARE_PROVIDER_SITE_OTHER): Payer: 59 | Admitting: Internal Medicine

## 2021-12-21 ENCOUNTER — Encounter: Payer: Self-pay | Admitting: Internal Medicine

## 2021-12-21 VITALS — BP 122/88 | HR 86 | Temp 98.2°F | Ht 67.0 in | Wt 194.0 lb

## 2021-12-21 DIAGNOSIS — R5383 Other fatigue: Secondary | ICD-10-CM | POA: Diagnosis not present

## 2021-12-21 DIAGNOSIS — E559 Vitamin D deficiency, unspecified: Secondary | ICD-10-CM

## 2021-12-21 DIAGNOSIS — E538 Deficiency of other specified B group vitamins: Secondary | ICD-10-CM | POA: Diagnosis not present

## 2021-12-21 DIAGNOSIS — R7301 Impaired fasting glucose: Secondary | ICD-10-CM

## 2021-12-21 DIAGNOSIS — E785 Hyperlipidemia, unspecified: Secondary | ICD-10-CM

## 2021-12-21 DIAGNOSIS — R748 Abnormal levels of other serum enzymes: Secondary | ICD-10-CM

## 2021-12-21 DIAGNOSIS — Z0000 Encounter for general adult medical examination without abnormal findings: Secondary | ICD-10-CM | POA: Diagnosis not present

## 2021-12-21 DIAGNOSIS — D500 Iron deficiency anemia secondary to blood loss (chronic): Secondary | ICD-10-CM

## 2021-12-21 DIAGNOSIS — Z6841 Body Mass Index (BMI) 40.0 and over, adult: Secondary | ICD-10-CM

## 2021-12-21 DIAGNOSIS — F331 Major depressive disorder, recurrent, moderate: Secondary | ICD-10-CM

## 2021-12-21 MED ORDER — PROPRANOLOL HCL ER 120 MG PO CP24: ORAL_CAPSULE | Freq: Every day | ORAL | 3 refills | Status: DC

## 2021-12-21 MED ORDER — TRAMADOL HCL 50 MG PO TABS: 100.0000 mg | ORAL_TABLET | Freq: Four times a day (QID) | ORAL | 5 refills | Status: DC | PRN

## 2021-12-21 MED ORDER — SEMAGLUTIDE-WEIGHT MANAGEMENT 2.4 MG/0.75ML ~~LOC~~ SOAJ: 2.4000 mg | SUBCUTANEOUS | 2 refills | Status: DC

## 2021-12-21 MED ORDER — ALPRAZOLAM 1 MG PO TABS: 1.5000 mg | ORAL_TABLET | Freq: Every evening | ORAL | 5 refills | Status: DC | PRN

## 2021-12-21 NOTE — Assessment & Plan Note (Signed)

## 2021-12-21 NOTE — Assessment & Plan Note (Signed)
Improved with  Break from work and with continued use of  lexapro . No changes today

## 2021-12-21 NOTE — Assessment & Plan Note (Signed)
She continues to lose weight with use of Wegovy at the 2.4 mg dose,  Taken every 10 days due to persistent nausea

## 2021-12-21 NOTE — Assessment & Plan Note (Signed)
Managed with monthly injections  Lab Results  Component Value Date   VITAMINB12 721 08/23/2020

## 2021-12-21 NOTE — Assessment & Plan Note (Signed)
Repeat level needed

## 2021-12-21 NOTE — Progress Notes (Unsigned)
The patient is here for annual non gyn  preventive examination and management of other chronic and acute problems.  PAP and breast [place] be done by [facility] .   She has established care with then since returning to [place] due to concern for endometriosis :  Increased bleeding,  cramping ,   Chronic pain: .  She has been  taking tramadol more frequently ,  around the clock during menses ,  otherwise for back pain once daily    The risk factors are reflected in the social history.   The roster of all physicians providing medical care to patient - is listed in the Snapshot section of the chart.   Activities of daily living:  The patient is 100% independent in all ADLs: dressing, toileting, feeding as well as independent mobility   Home safety : The patient has smoke detectors in the home. They wear seatbelts.  There are no unsecured firearms at home. There is no violence in the home.    There is no risks for hepatitis, STDs or HIV. There is no   history of blood transfusion. They have no travel history to infectious disease endemic areas of the world.   The patient has seen their dentist in the last six month. They have seen their eye doctor in the last year. The patient  denies slight hearing difficulty with regard to whispered voices and some television programs.  They have deferred audiologic testing in the last year.  They do not  have excessive sun exposure. Discussed the need for sun protection: hats, long sleeves and use of sunscreen if there is significant sun exposure.    Diet: the importance of a healthy diet is discussed. They do have a healthy diet.   The benefits of regular aerobic exercise were discussed. The patient  is walking   3 to 5 days per week  for  60 minutes.    Depression screen: there are no signs or vegative symptoms of depression- irritability, change in appetite, anhedonia, sadness/tearfullness.   The following portions of the patient's history were reviewed and  updated as appropriate: allergies, current medications, past family history, past medical history,  past surgical history, past social history  and problem list.   Visual acuity was not assessed per patient preference since the patient has regular follow up with an  ophthalmologist. Hearing and body mass index were assessed and reviewed.    During the course of the visit the patient was educated and counseled about appropriate screening and preventive services including : fall prevention , diabetes screening, nutrition counseling, colorectal cancer screening, and recommended immunizations.    Chief Complaint:  1) Chronic pain:  back and menstrual cramping for 10 dayss/per month.  Using tramadol.  Seeing GYN to discuss hysterectomy  2) GAD:  alprazolam used not more than 1.5 mg daily .    Review of Symptoms  Patient denies headache, fevers, malaise, unintentional weight loss, skin rash, eye pain, sinus congestion and sinus pain, sore throat, dysphagia,  hemoptysis , cough, dyspnea, wheezing, chest pain, palpitations, orthopnea, edema, abdominal pain, nausea, melena, diarrhea, constipation, flank pain, dysuria, hematuria, urinary  Frequency, nocturia, numbness, tingling, seizures,  Focal weakness, Loss of consciousness,  Tremor, insomnia, depression, anxiety, and suicidal ideation.    Physical Exam:  BP 122/88 (BP Location: Left Arm, Patient Position: Sitting, Cuff Size: Large)   Pulse 86   Temp 98.2 F (36.8 C) (Oral)   Ht '5\' 7"'$  (1.702 m)   Wt  194 lb (88 kg)   SpO2 98%   BMI 30.38 kg/m    General appearance: alert, cooperative and appears stated age Ears: normal TM's and external ear canals both ears Throat: lips, mucosa, and tongue normal; teeth and gums normal Neck: no adenopathy, no carotid bruit, supple, symmetrical, trachea midline and thyroid not enlarged, symmetric, no tenderness/mass/nodules Back: symmetric, no curvature. ROM normal. No CVA tenderness. Lungs: clear to  auscultation bilaterally Heart: regular rate and rhythm, S1, S2 normal, no murmur, click, rub or gallop Abdomen: soft, non-tender; bowel sounds normal; no masses,  no organomegaly Pulses: 2+ and symmetric Skin: Skin color, texture, turgor normal. No rashes or lesions Lymph nodes: Cervical, supraclavicular, and axillary nodes normal.    Assessment and Plan:  Obesity She continues to lose weight with use of Wegovy at the 2.4 mg dose,  Taken every 10 days due to persistent nausea   Encounter for preventive health examination age appropriate education and counseling updated, referrals for preventative services and immunizations addressed, dietary and smoking counseling addressed, most recent labs reviewed.  I have personally reviewed and have noted:   1) the patient's medical and social history 2) The pt's use of alcohol, tobacco, and illicit drugs 3) The patient's current medications and supplements 4) Functional ability including ADL's, fall risk, home safety risk, hearing and visual impairment 5) Diet and physical activities 6) Evidence for depression or mood disorder 7) The patient's height, weight, and BMI have been recorded in the chart.     I have made referrals, and provided counseling and education based on review of the above  Vitamin D deficiency Repeat level needed   B12 deficiency Managed with monthly injections  Lab Results  Component Value Date   [id number] 696 08/23/2020     Major depressive disorder, recurrent ([place])  Improved with  Break from work and with continued use of  lexapro . No changes today    Updated Medication List Outpatient Encounter Medications as of 12/21/2021  Medication Sig   albuterol (VENTOLIN HFA) 108 (90 Base) MCG/ACT inhaler Inhale 2 puffs into the lungs every 4 (four) hours as needed for wheezing or shortness of breath.   azelastine (ASTELIN) 0.1 % nasal spray PLACE 2 SPRAYS INTO BOTH NOSTRILS 2 TIMES DAILY AS DIRECTED   bimatoprost  (LATISSE) 0.03 % ophthalmic solution APPLY 1 DROP TO UPPER EYE LID AT BEDTIME AS DIRECTED   cyanocobalamin (,VITAMIN B-12,) 1000 MCG/ML injection INJECT 1 MILLILITER (ML) INTO THE MUSCLE EVERY THIRTY DAYS   dicyclomine (BENTYL) 10 MG capsule Take 1 capsule (10 mg total) by mouth 4 (four) times daily -  before meals and at bedtime.   escitalopram (LEXAPRO) 20 MG tablet Take 1 tablet (20 mg total) by mouth daily.   lidocaine-prilocaine (EMLA) cream APPLY TOPICALLY AS NEEDED.   mometasone (NASONEX) 50 MCG/ACT nasal spray PLACE 2 SPRAYS INTO THE NOSE DAILY.   montelukast (SINGULAIR) 10 MG tablet TAKE 1 TABLET BY MOUTH AT BEDTIME.   Multiple Vitamins-Minerals (CENTRUM WOMEN PO)    mupirocin ointment (BACTROBAN) 2 % Apply to affected area daily   omeprazole (PRILOSEC) 40 MG capsule TAKE 1 CAPSULE BY MOUTH 2 TIMES DAILY BEFORE A MEAL.   ondansetron (ZOFRAN) 8 MG tablet TAKE 1 TABLET BY MOUTH EVERY 8 HOURS AS NEEDED   SUMAtriptan (IMITREX) 100 MG tablet TAKE 1 TABLET BY MOUTH DAILY AS NEEDED.   tiZANidine (ZANAFLEX) 4 MG tablet TAKE 1 TABLET BY MOUTH EVERY 6 HOURS AS NEEDED FOR SPASMS   [DISCONTINUED]  ALPRAZolam (XANAX) 1 MG tablet Take 1.5 tablets (1.5 mg total) by mouth at bedtime as needed for anxiety.   [DISCONTINUED] propranolol (INDERAL) 10 MG tablet Take 1 tablet (10 mg total) by mouth 3 (three) times daily.   [DISCONTINUED] Semaglutide-Weight Management 2.4 MG/0.75ML SOAJ Inject 1 pen (2.4 mg) into the skin once a week for 28 days.   [DISCONTINUED] traMADol (ULTRAM) 50 MG tablet Take 2 tablets (100 mg total) by mouth every 6 (six) hours as needed. (30 day supply)   ALPRAZolam (XANAX) 1 MG tablet Take 1.5 tablets (1.5 mg total) by mouth at bedtime as needed for anxiety.   desloratadine (CLARINEX) 5 MG tablet TAKE 1 TABLET (5 MG TOTAL) BY MOUTH DAILY.   propranolol ER (INDERAL LA) 120 MG 24 hr capsule TAKE 1 CAPSULE BY MOUTH DAILY.   [START ON 03/05/2022] Semaglutide-Weight Management 2.4 MG/0.75ML  SOAJ Inject 1 pen (2.4 mg) into the skin once a week for 28 days.   traMADol (ULTRAM) 50 MG tablet Take 2 tablets (100 mg total) by mouth every 6 (six) hours as needed. (30 day supply)   [DISCONTINUED] phenazopyridine (PYRIDIUM) 100 MG tablet Take 1 tablet (100 mg total) by mouth 3 (three) times daily as needed for pain.   [DISCONTINUED] Semaglutide, 2 MG/DOSE, 8 MG/3ML SOPN Inject 2 mg as directed once a week.   [DISCONTINUED] Semaglutide-Weight Management 2.4 MG/0.75ML SOAJ Inject 1 pen (2.4 mg) into the skin once a week for 28 days.   [DISCONTINUED] sulfamethoxazole-trimethoprim (BACTRIM DS) 800-160 MG tablet Take 1 tablet by mouth 2 (two) times daily.   No facility-administered encounter medications on file as of 12/21/2021.

## 2021-12-22 ENCOUNTER — Other Ambulatory Visit (HOSPITAL_COMMUNITY): Payer: Self-pay

## 2021-12-22 ENCOUNTER — Encounter: Payer: Self-pay | Admitting: Internal Medicine

## 2021-12-28 ENCOUNTER — Other Ambulatory Visit (HOSPITAL_COMMUNITY): Payer: Self-pay

## 2021-12-29 ENCOUNTER — Ambulatory Visit: Payer: 59 | Admitting: Internal Medicine

## 2021-12-30 ENCOUNTER — Encounter: Payer: Self-pay | Admitting: Internal Medicine

## 2021-12-30 ENCOUNTER — Other Ambulatory Visit (HOSPITAL_COMMUNITY): Payer: Self-pay

## 2021-12-30 ENCOUNTER — Telehealth (INDEPENDENT_AMBULATORY_CARE_PROVIDER_SITE_OTHER): Payer: 59 | Admitting: Internal Medicine

## 2021-12-30 VITALS — Ht 67.0 in | Wt 194.0 lb

## 2021-12-30 DIAGNOSIS — Z6841 Body Mass Index (BMI) 40.0 and over, adult: Secondary | ICD-10-CM | POA: Diagnosis not present

## 2021-12-30 DIAGNOSIS — D500 Iron deficiency anemia secondary to blood loss (chronic): Secondary | ICD-10-CM

## 2021-12-30 DIAGNOSIS — N200 Calculus of kidney: Secondary | ICD-10-CM | POA: Diagnosis not present

## 2021-12-30 DIAGNOSIS — M5387 Other specified dorsopathies, lumbosacral region: Secondary | ICD-10-CM | POA: Diagnosis not present

## 2021-12-30 DIAGNOSIS — M4722 Other spondylosis with radiculopathy, cervical region: Secondary | ICD-10-CM | POA: Diagnosis not present

## 2021-12-30 DIAGNOSIS — M545 Low back pain, unspecified: Secondary | ICD-10-CM | POA: Diagnosis not present

## 2021-12-30 MED ORDER — TAMSULOSIN HCL 0.4 MG PO CAPS: 0.4000 mg | ORAL_CAPSULE | Freq: Every day | ORAL | 0 refills | Status: DC

## 2021-12-30 MED ORDER — ONDANSETRON HCL 8 MG PO TABS: ORAL_TABLET | ORAL | 4 refills | Status: DC

## 2021-12-30 MED ORDER — TRAMADOL HCL 50 MG PO TABS: 100.0000 mg | ORAL_TABLET | Freq: Four times a day (QID) | ORAL | 5 refills | Status: DC | PRN

## 2021-12-30 NOTE — Progress Notes (Unsigned)
Virtual Visit via [facility]   Note    This format is felt to be most appropriate for this patient at this time.  All issues noted in this document were discussed and addressed.  No physical exam was performed (except for noted visual exam findings with Video Visits).   I connected with [name]  on 12/30/21 at  2:00 PM EDT by a video enabled telemedicine applicatione and verified that I am speaking with the correct person using two identifiers. Location patient: home Location provider: work or home office Persons participating in the virtual visit: patient, provider  I discussed the limitations, risks, security and privacy concerns of performing an evaluation and management service by telephone and the availability of in person appointments. I also discussed with the patient that there may be a patient responsible charge related to this service. The patient expressed understanding and agreed to proceed.  Reason for visit: multiple issues raised in MyChart following   HPI:   History of L4-S1 fusion and cervical disk disease: she is requesting annual films   History of kidney stones:  requesting flomax renewal  Acne : using Duac,  wants retinoic acid topica  Heavy menses:  wansts iron checked    ROS: See pertinent positives and negatives per HPI.  Past Medical History:  Diagnosis Date   Abnormal Pap smear of cervix 2012   Abortion 2001   Allergy    Anxiety    panic disorder   Arthritis    Asthma    Gastritis 2005   Hyperlipidemia    Kidney stone    multiple   Migraines    Obesity    Panic disorder    Vitamin D deficiency disease     Past Surgical History:  Procedure Laterality Date   BREAST BIOPSY Right 02/04/2001   Negative c Clip   CHOLECYSTECTOMY  04/08/2012   COLONOSCOPY WITH PROPOFOL N/A 07/12/2017   Procedure: COLONOSCOPY WITH PROPOFOL;  Surgeon: Lin Landsman, MD;  Location: [place];  Service: Gastroenterology;  Laterality: N/A;   COLPOSCOPY  2012    ESOPHAGOGASTRODUODENOSCOPY (EGD) WITH PROPOFOL N/A 07/12/2017   Procedure: ESOPHAGOGASTRODUODENOSCOPY (EGD) WITH PROPOFOL;  Surgeon: Lin Landsman, MD;  Location: [facility];  Service: Gastroenterology;  Laterality: N/A;   KNEE ARTHROSCOPY Left 05/01/2016   Procedure: ARTHROSCOPY KNEE LATERAL RELEASE;  Surgeon: Hessie Knows, MD;  Location: [facility];  Service: Orthopedics;  Laterality: Left;   laprascopic sleeve  04/08/2012   GASTRIC   SPINAL FUSION  09/25/2016    Family History  Problem Relation Age of Onset   Cancer Maternal Grandmother        LUNG, THROAT   Macular degeneration Maternal Grandmother    Heart disease Paternal Grandmother    Alzheimer's disease Paternal Grandmother    Stroke Paternal Grandmother    Cancer Paternal Grandfather        COLON   Diabetes Father    Heart disease Father        atrial fib   Hyperlipidemia Father    Hypertension Father    Cancer Father 47       colon polyps   Colon polyps Father     SOCIAL HX: ***   Current Outpatient Medications:    albuterol (VENTOLIN HFA) 108 (90 Base) MCG/ACT inhaler, Inhale 2 puffs into the lungs every 4 (four) hours as needed for wheezing or shortness of breath., Disp: 18 g, Rfl: 5   ALPRAZolam (XANAX) 1 MG tablet, Take 1.5 tablets (1.5 mg total) by  mouth at bedtime as needed for anxiety., Disp: 45 tablet, Rfl: 5   azelastine (ASTELIN) 0.1 % nasal spray, PLACE 2 SPRAYS INTO BOTH NOSTRILS 2 TIMES DAILY AS DIRECTED, Disp: 30 mL, Rfl: 9   bimatoprost (LATISSE) 0.03 % ophthalmic solution, APPLY 1 DROP TO UPPER EYE LID AT BEDTIME AS DIRECTED, Disp: 5 mL, Rfl: 6   cyanocobalamin (,VITAMIN B-12,) 1000 MCG/ML injection, INJECT 1 MILLILITER (ML) INTO THE MUSCLE EVERY THIRTY DAYS, Disp: 3 mL, Rfl: 2   dicyclomine (BENTYL) 10 MG capsule, Take 1 capsule (10 mg total) by mouth 4 (four) times daily -  before meals and at bedtime., Disp: 120 capsule, Rfl: 0   escitalopram (LEXAPRO) 20 MG tablet, Take 1 tablet (20 mg  total) by mouth daily., Disp: 90 tablet, Rfl: 1   lidocaine-prilocaine (EMLA) cream, APPLY TOPICALLY AS NEEDED., Disp: 30 g, Rfl: 3   mometasone (NASONEX) 50 MCG/ACT nasal spray, PLACE 2 SPRAYS INTO THE NOSE DAILY., Disp: 17 g, Rfl: 9   montelukast (SINGULAIR) 10 MG tablet, TAKE 1 TABLET BY MOUTH AT BEDTIME., Disp: 90 tablet, Rfl: 3   Multiple Vitamins-Minerals (CENTRUM WOMEN PO), , Disp: , Rfl:    mupirocin ointment (BACTROBAN) 2 %, Apply to affected area daily, Disp: 22 g, Rfl: 2   omeprazole (PRILOSEC) 40 MG capsule, TAKE 1 CAPSULE BY MOUTH 2 TIMES DAILY BEFORE A MEAL., Disp: 180 capsule, Rfl: 3   ondansetron (ZOFRAN) 8 MG tablet, TAKE 1 TABLET BY MOUTH EVERY 8 HOURS AS NEEDED, Disp: 90 tablet, Rfl: 4   propranolol ER (INDERAL LA) 120 MG 24 hr capsule, TAKE 1 CAPSULE BY MOUTH DAILY., Disp: 90 capsule, Rfl: 3   [START ON 03/05/2022] Semaglutide-Weight Management 2.4 MG/0.75ML SOAJ, Inject 1 pen (2.4 mg) into the skin once a week for 28 days., Disp: 3 mL, Rfl: 2   SUMAtriptan (IMITREX) 100 MG tablet, TAKE 1 TABLET BY MOUTH DAILY AS NEEDED., Disp: 9 tablet, Rfl: 3   tiZANidine (ZANAFLEX) 4 MG tablet, TAKE 1 TABLET BY MOUTH EVERY 6 HOURS AS NEEDED FOR SPASMS, Disp: 90 tablet, Rfl: 5   traMADol (ULTRAM) 50 MG tablet, Take 2 tablets (100 mg total) by mouth every 6 (six) hours as needed. (30 day supply), Disp: 120 tablet, Rfl: 5   desloratadine (CLARINEX) 5 MG tablet, TAKE 1 TABLET (5 MG TOTAL) BY MOUTH DAILY., Disp: 90 tablet, Rfl: 3  EXAM:  VITALS per patient if applicable:  GENERAL: alert, oriented, appears well and in no acute distress  HEENT: atraumatic, conjunttiva clear, no obvious abnormalities on inspection of external nose and ears  NECK: normal movements of the head and neck  LUNGS: on inspection no signs of respiratory distress, breathing rate appears normal, no obvious gross SOB, gasping or wheezing  CV: no obvious cyanosis  MS: moves all visible extremities without noticeable  abnormality  PSYCH/NEURO: pleasant and cooperative, no obvious depression or anxiety, speech and thought processing grossly intact  ASSESSMENT AND PLAN:  Discussed the following assessment and plan:  No diagnosis found.  No problem-specific Assessment & Plan notes found for this encounter.    I discussed the assessment and treatment plan with the patient. The patient was provided an opportunity to ask questions and all were answered. The patient agreed with the plan and demonstrated an understanding of the instructions.   The patient was advised to call back or seek an in-person evaluation if the symptoms worsen or if the condition fails to improve as anticipated.   I spent 30 minutes  dedicated to the care of this patient on the date of this encounter to include pre-visit review of his medical history,  Face-to-face time with the patient , and post visit ordering of testing and therapeutics.    Tammy Mc, MD

## 2021-12-30 NOTE — Patient Instructions (Signed)
TR YUSING FLOMAX FOR NEXT MENSES RELATED CRAMPS  TRAMADOL REFILLED AT 180/MONTH

## 2022-01-01 NOTE — Assessment & Plan Note (Signed)
Secondary to menometrorrhagia.  Resolved by 2022 labs,  Resume surveillance  Lab Results  Component Value Date   WBC 6.3 08/23/2020   HGB 14.4 08/23/2020   HCT 43.0 08/23/2020   MCV 88 08/23/2020   PLT 321 08/23/2020   Lab Results  Component Value Date   IRON 59 05/24/2020   TIBC 375 05/24/2020   FERRITIN 11 (L) 05/24/2020

## 2022-01-01 NOTE — Assessment & Plan Note (Signed)
She continues to lose weight with use of Wegovy at the 2.4 mg dose,  Taken every 10 days due to persistent nausea

## 2022-01-01 NOTE — Assessment & Plan Note (Signed)
Pain management has improved with weight loss but is aggravated by physical activity .  She is  using 2  tramadol daily every 12 hours as needed along with tizanidine  And gabapentin  Repeat films ordered

## 2022-01-01 NOTE — Assessment & Plan Note (Addendum)
Plain films have noted  spurring and disk space narrowing at the lower cervical levels., mild degenerative changes. Minimal retrolisthesis of C5 versus C6, approximately 2 mm. Mild narrowing of the right C5-6 neural foramen due to small posterior osteophytes.notes .  Pain management regimen reviewed,  She is using tramadol,and gabapentin. Chronic use of NSAIDS is C/I dur to history of gastric sleeve.   Repeat films ordered

## 2022-01-01 NOTE — Assessment & Plan Note (Signed)
Refilling flomax for prn use

## 2022-01-02 ENCOUNTER — Other Ambulatory Visit (HOSPITAL_COMMUNITY): Payer: Self-pay

## 2022-01-16 ENCOUNTER — Encounter (INDEPENDENT_AMBULATORY_CARE_PROVIDER_SITE_OTHER): Payer: Self-pay

## 2022-01-16 ENCOUNTER — Telehealth: Payer: 59 | Admitting: Internal Medicine

## 2022-01-19 ENCOUNTER — Other Ambulatory Visit (HOSPITAL_COMMUNITY): Payer: Self-pay

## 2022-01-28 ENCOUNTER — Other Ambulatory Visit (HOSPITAL_COMMUNITY): Payer: Self-pay

## 2022-01-28 ENCOUNTER — Other Ambulatory Visit: Payer: Self-pay | Admitting: Internal Medicine

## 2022-01-28 DIAGNOSIS — R109 Unspecified abdominal pain: Secondary | ICD-10-CM

## 2022-01-30 ENCOUNTER — Other Ambulatory Visit: Payer: 59

## 2022-01-30 ENCOUNTER — Other Ambulatory Visit (HOSPITAL_COMMUNITY): Payer: Self-pay

## 2022-01-31 ENCOUNTER — Telehealth: Payer: 59 | Admitting: Internal Medicine

## 2022-01-31 ENCOUNTER — Other Ambulatory Visit (HOSPITAL_COMMUNITY): Payer: Self-pay

## 2022-01-31 MED ORDER — DICYCLOMINE HCL 10 MG PO CAPS
10.0000 mg | ORAL_CAPSULE | Freq: Three times a day (TID) | ORAL | 0 refills | Status: DC
  Filled 2022-01-31: qty 120, 30d supply, fill #0

## 2022-01-31 MED ORDER — DESLORATADINE 5 MG PO TABS
ORAL_TABLET | Freq: Every day | ORAL | 3 refills | Status: DC
  Filled 2022-01-31: qty 90, 90d supply, fill #0
  Filled 2022-07-30: qty 90, 90d supply, fill #1
  Filled 2022-09-01: qty 90, 90d supply, fill #2

## 2022-01-31 MED ORDER — ALBUTEROL SULFATE HFA 108 (90 BASE) MCG/ACT IN AERS
2.0000 | INHALATION_SPRAY | RESPIRATORY_TRACT | 5 refills | Status: DC | PRN
  Filled 2022-01-31: qty 6.7, 16d supply, fill #0
  Filled 2022-09-01: qty 6.7, 16d supply, fill #1

## 2022-02-01 ENCOUNTER — Other Ambulatory Visit (HOSPITAL_COMMUNITY): Payer: Self-pay

## 2022-02-01 ENCOUNTER — Other Ambulatory Visit: Payer: Self-pay | Admitting: Internal Medicine

## 2022-02-02 ENCOUNTER — Other Ambulatory Visit: Payer: 59

## 2022-02-07 ENCOUNTER — Encounter: Payer: Self-pay | Admitting: Internal Medicine

## 2022-02-08 ENCOUNTER — Other Ambulatory Visit (HOSPITAL_COMMUNITY): Payer: Self-pay

## 2022-02-08 ENCOUNTER — Other Ambulatory Visit: Payer: 59

## 2022-02-08 MED ORDER — METOPROLOL SUCCINATE ER 100 MG PO TB24: 100.0000 mg | ORAL_TABLET | Freq: Every day | ORAL | 3 refills | Status: DC

## 2022-02-08 MED ORDER — TIZANIDINE HCL 4 MG PO TABS: ORAL_TABLET | ORAL | 1 refills | Status: DC

## 2022-02-08 NOTE — Telephone Encounter (Signed)
tOPROL xl AND TIZANIDINE SENT TO [facility] IN [place] PER PATIENT REQUEST.

## 2022-02-10 ENCOUNTER — Other Ambulatory Visit: Payer: Self-pay | Admitting: Internal Medicine

## 2022-02-16 ENCOUNTER — Ambulatory Visit: Payer: 59

## 2022-02-16 ENCOUNTER — Other Ambulatory Visit: Payer: 59

## 2022-02-17 ENCOUNTER — Ambulatory Visit (INDEPENDENT_AMBULATORY_CARE_PROVIDER_SITE_OTHER): Payer: 59

## 2022-02-17 ENCOUNTER — Other Ambulatory Visit (INDEPENDENT_AMBULATORY_CARE_PROVIDER_SITE_OTHER): Payer: 59

## 2022-02-17 DIAGNOSIS — M50322 Other cervical disc degeneration at C5-C6 level: Secondary | ICD-10-CM | POA: Diagnosis not present

## 2022-02-17 DIAGNOSIS — M5387 Other specified dorsopathies, lumbosacral region: Secondary | ICD-10-CM

## 2022-02-17 DIAGNOSIS — M4722 Other spondylosis with radiculopathy, cervical region: Secondary | ICD-10-CM

## 2022-02-17 DIAGNOSIS — Z23 Encounter for immunization: Secondary | ICD-10-CM | POA: Diagnosis not present

## 2022-02-17 DIAGNOSIS — D500 Iron deficiency anemia secondary to blood loss (chronic): Secondary | ICD-10-CM | POA: Diagnosis not present

## 2022-02-17 DIAGNOSIS — Z981 Arthrodesis status: Secondary | ICD-10-CM | POA: Diagnosis not present

## 2022-02-19 MED ORDER — HYDROCODONE-ACETAMINOPHEN 10-325 MG PO TABS: 1.0000 | ORAL_TABLET | Freq: Four times a day (QID) | ORAL | 0 refills | Status: AC | PRN

## 2022-02-19 MED ORDER — PREDNISONE 10 MG PO TABS: ORAL_TABLET | ORAL | 0 refills | Status: DC

## 2022-02-19 NOTE — Addendum Note (Signed)
Addended by: Crecencio Mc on: 02/19/2022 09:50 PM   Modules accepted: Orders

## 2022-02-20 ENCOUNTER — Other Ambulatory Visit (INDEPENDENT_AMBULATORY_CARE_PROVIDER_SITE_OTHER): Payer: 59

## 2022-02-20 DIAGNOSIS — R7301 Impaired fasting glucose: Secondary | ICD-10-CM | POA: Diagnosis not present

## 2022-02-20 DIAGNOSIS — E538 Deficiency of other specified B group vitamins: Secondary | ICD-10-CM | POA: Diagnosis not present

## 2022-02-20 DIAGNOSIS — E785 Hyperlipidemia, unspecified: Secondary | ICD-10-CM

## 2022-02-20 DIAGNOSIS — R5383 Other fatigue: Secondary | ICD-10-CM

## 2022-02-20 DIAGNOSIS — E559 Vitamin D deficiency, unspecified: Secondary | ICD-10-CM

## 2022-02-20 DIAGNOSIS — D500 Iron deficiency anemia secondary to blood loss (chronic): Secondary | ICD-10-CM | POA: Diagnosis not present

## 2022-02-20 DIAGNOSIS — R748 Abnormal levels of other serum enzymes: Secondary | ICD-10-CM

## 2022-02-20 LAB — LIPID PANEL
Cholesterol: 237 mg/dL — ABNORMAL HIGH (ref 0–200)
HDL: 49.9 mg/dL (ref >39.00)
LDL Cholesterol: 167 mg/dL — ABNORMAL HIGH (ref 0–99)
NonHDL: 186.76
Total CHOL/HDL Ratio: 5
Triglycerides: 99 mg/dL (ref 0.0–149.0)
VLDL: 19.8 mg/dL (ref 0.0–40.0)

## 2022-02-20 LAB — CBC WITH DIFFERENTIAL/PLATELET
Basophils Absolute: 0 10*3/uL (ref 0.0–0.1)
Basophils Relative: 0.5 % (ref 0.0–3.0)
Eosinophils Absolute: 0.1 10*3/uL (ref 0.0–0.7)
Eosinophils Relative: 0.6 % (ref 0.0–5.0)
HCT: 41.7 % (ref 36.0–46.0)
Hemoglobin: 14.3 g/dL (ref 12.0–15.0)
Lymphocytes Relative: 36.8 % (ref 12.0–46.0)
Lymphs Abs: 3.3 10*3/uL (ref 0.7–4.0)
MCHC: 34.4 g/dL (ref 30.0–36.0)
MCV: 86.8 fl (ref 78.0–100.0)
Monocytes Absolute: 0.6 10*3/uL (ref 0.1–1.0)
Monocytes Relative: 6.9 % (ref 3.0–12.0)
Neutro Abs: 4.9 10*3/uL (ref 1.4–7.7)
Neutrophils Relative %: 55.2 % (ref 43.0–77.0)
Platelets: 412 10*3/uL — ABNORMAL HIGH (ref 150.0–400.0)
RBC: 4.8 Mil/uL (ref 3.87–5.11)
RDW: 14.6 % (ref 11.5–15.5)
WBC: 8.9 10*3/uL (ref 4.0–10.5)

## 2022-02-20 LAB — COMPREHENSIVE METABOLIC PANEL
ALT: 44 U/L — ABNORMAL HIGH (ref 0–35)
AST: 35 U/L (ref 0–37)
Albumin: 4 g/dL (ref 3.5–5.2)
Alkaline Phosphatase: 69 U/L (ref 39–117)
BUN: 6 mg/dL (ref 6–23)
CO2: 28 mEq/L (ref 19–32)
Calcium: 8.9 mg/dL (ref 8.4–10.5)
Chloride: 102 mEq/L (ref 96–112)
Creatinine, Ser: 0.59 mg/dL (ref 0.40–1.20)
GFR: 110.67 mL/min (ref >60.00)
Glucose, Bld: 86 mg/dL (ref 70–99)
Potassium: 3.7 mEq/L (ref 3.5–5.1)
Sodium: 141 mEq/L (ref 135–145)
Total Bilirubin: 0.5 mg/dL (ref 0.2–1.2)
Total Protein: 6.4 g/dL (ref 6.0–8.3)

## 2022-02-20 LAB — VITAMIN D 25 HYDROXY (VIT D DEFICIENCY, FRACTURES): VITD: 31.09 ng/mL (ref 30.00–100.00)

## 2022-02-20 LAB — TSH: TSH: 1.37 u[IU]/mL (ref 0.35–5.50)

## 2022-02-20 LAB — VITAMIN B12: Vitamin B-12: 353 pg/mL (ref 211–911)

## 2022-02-20 LAB — LDL CHOLESTEROL, DIRECT: Direct LDL: 175 mg/dL

## 2022-02-20 LAB — HEMOGLOBIN A1C: Hgb A1c MFr Bld: 5.3 % (ref 4.6–6.5)

## 2022-02-21 LAB — IRON,TIBC AND FERRITIN PANEL
%SAT: 26 % (calc) (ref 16–45)
Ferritin: 9 ng/mL — ABNORMAL LOW (ref 16–232)
Iron: 94 ug/dL (ref 40–190)
TIBC: 367 mcg/dL (calc) (ref 250–450)

## 2022-03-01 NOTE — Telephone Encounter (Signed)
MyChart messgae sent to patient. 

## 2022-03-10 ENCOUNTER — Other Ambulatory Visit (HOSPITAL_COMMUNITY): Payer: Self-pay

## 2022-04-09 ENCOUNTER — Encounter: Payer: Self-pay | Admitting: Internal Medicine

## 2022-04-12 MED ORDER — ALPRAZOLAM 1 MG PO TABS: 1.5000 mg | ORAL_TABLET | Freq: Every evening | ORAL | 0 refills | Status: DC | PRN

## 2022-04-12 MED ORDER — METOPROLOL SUCCINATE ER 100 MG PO TB24: 100.0000 mg | ORAL_TABLET | Freq: Every day | ORAL | 0 refills | Status: DC

## 2022-04-12 MED ORDER — TRAMADOL HCL 50 MG PO TABS: 100.0000 mg | ORAL_TABLET | Freq: Four times a day (QID) | ORAL | 0 refills | Status: DC | PRN

## 2022-04-14 MED ORDER — TRAMADOL HCL 50 MG PO TABS: 100.0000 mg | ORAL_TABLET | Freq: Four times a day (QID) | ORAL | 0 refills | Status: DC | PRN

## 2022-04-14 MED ORDER — ALPRAZOLAM 1 MG PO TABS: 1.5000 mg | ORAL_TABLET | Freq: Every evening | ORAL | 0 refills | Status: DC | PRN

## 2022-04-14 NOTE — Addendum Note (Signed)
Addended by: Crecencio Mc on: 04/14/2022 01:00 PM   Modules accepted: Orders

## 2022-05-13 ENCOUNTER — Other Ambulatory Visit: Payer: Self-pay | Admitting: Internal Medicine

## 2022-05-14 ENCOUNTER — Encounter: Payer: Self-pay | Admitting: Internal Medicine

## 2022-05-15 ENCOUNTER — Other Ambulatory Visit: Payer: Self-pay

## 2022-05-15 ENCOUNTER — Telehealth: Payer: Self-pay

## 2022-05-15 ENCOUNTER — Other Ambulatory Visit: Payer: Self-pay | Admitting: Internal Medicine

## 2022-05-15 DIAGNOSIS — N945 Secondary dysmenorrhea: Secondary | ICD-10-CM | POA: Diagnosis not present

## 2022-05-15 DIAGNOSIS — Z01411 Encounter for gynecological examination (general) (routine) with abnormal findings: Secondary | ICD-10-CM | POA: Diagnosis not present

## 2022-05-15 DIAGNOSIS — Z1159 Encounter for screening for other viral diseases: Secondary | ICD-10-CM | POA: Diagnosis not present

## 2022-05-15 DIAGNOSIS — Z01419 Encounter for gynecological examination (general) (routine) without abnormal findings: Secondary | ICD-10-CM | POA: Diagnosis not present

## 2022-05-15 DIAGNOSIS — Z1231 Encounter for screening mammogram for malignant neoplasm of breast: Secondary | ICD-10-CM | POA: Diagnosis not present

## 2022-05-15 DIAGNOSIS — Z114 Encounter for screening for human immunodeficiency virus [HIV]: Secondary | ICD-10-CM | POA: Diagnosis not present

## 2022-05-15 DIAGNOSIS — Z124 Encounter for screening for malignant neoplasm of cervix: Secondary | ICD-10-CM | POA: Diagnosis not present

## 2022-05-15 DIAGNOSIS — Z113 Encounter for screening for infections with a predominantly sexual mode of transmission: Secondary | ICD-10-CM | POA: Diagnosis not present

## 2022-05-15 MED ORDER — TRAMADOL HCL 50 MG PO TABS: 100.0000 mg | ORAL_TABLET | Freq: Four times a day (QID) | ORAL | 1 refills | Status: DC | PRN

## 2022-05-15 MED ORDER — OMEPRAZOLE 40 MG PO CPDR
40.0000 mg | DELAYED_RELEASE_CAPSULE | Freq: Two times a day (BID) | ORAL | 3 refills | Status: DC
  Filled 2022-05-15: qty 180, 90d supply, fill #0
  Filled 2022-08-06: qty 180, 90d supply, fill #1
  Filled 2022-09-01: qty 180, 90d supply, fill #2

## 2022-05-15 MED ORDER — METOPROLOL SUCCINATE ER 100 MG PO TB24
100.0000 mg | ORAL_TABLET | Freq: Every day | ORAL | 0 refills | Status: DC
  Filled 2022-05-15: qty 90, 90d supply, fill #0

## 2022-05-15 MED ORDER — ALPRAZOLAM 1 MG PO TABS: 1.5000 mg | ORAL_TABLET | Freq: Every evening | ORAL | 1 refills | Status: DC | PRN

## 2022-05-15 NOTE — Telephone Encounter (Signed)
LM for pt to cb 

## 2022-05-15 NOTE — Telephone Encounter (Signed)
YouJust now (3:38 PM)    LM for pt to cb      Note   Crecencio Mc, MD  You2 hours ago (1:13 PM)    Needs to sign contract if not in chart.  Needs appt in may

## 2022-05-15 NOTE — Telephone Encounter (Signed)
Sent to Dr Derrel Nip for approval

## 2022-05-16 ENCOUNTER — Other Ambulatory Visit: Payer: Self-pay

## 2022-05-22 LAB — HM MAMMOGRAPHY

## 2022-06-12 ENCOUNTER — Other Ambulatory Visit (HOSPITAL_COMMUNITY): Payer: Self-pay

## 2022-06-14 ENCOUNTER — Other Ambulatory Visit: Payer: Self-pay

## 2022-06-14 DIAGNOSIS — N946 Dysmenorrhea, unspecified: Secondary | ICD-10-CM | POA: Diagnosis not present

## 2022-07-02 ENCOUNTER — Encounter: Payer: Self-pay | Admitting: Internal Medicine

## 2022-07-03 ENCOUNTER — Other Ambulatory Visit: Payer: Self-pay | Admitting: Internal Medicine

## 2022-07-03 ENCOUNTER — Other Ambulatory Visit (HOSPITAL_COMMUNITY): Payer: Self-pay

## 2022-07-03 NOTE — Telephone Encounter (Signed)
Refills pended  Last OV: 12/30/2021 Next OV: 07/19/2022

## 2022-07-03 NOTE — Telephone Encounter (Signed)
Refilled: 05/15/2022 Last OV: 12/30/2021 Next OV: 07/19/2022

## 2022-07-04 ENCOUNTER — Other Ambulatory Visit (HOSPITAL_COMMUNITY): Payer: Self-pay

## 2022-07-04 ENCOUNTER — Other Ambulatory Visit: Payer: Self-pay

## 2022-07-04 MED ORDER — TAMSULOSIN HCL 0.4 MG PO CAPS
0.4000 mg | ORAL_CAPSULE | Freq: Every day | ORAL | 1 refills | Status: DC
  Filled 2022-07-04: qty 90, 90d supply, fill #0
  Filled 2022-09-01 – 2022-09-04 (×2): qty 90, 90d supply, fill #1

## 2022-07-04 MED ORDER — TIZANIDINE HCL 4 MG PO TABS: ORAL_TABLET | ORAL | 1 refills | Status: DC

## 2022-07-04 MED ORDER — SUMATRIPTAN SUCCINATE 100 MG PO TABS
100.0000 mg | ORAL_TABLET | Freq: Every day | ORAL | 11 refills | Status: DC | PRN
  Filled 2022-07-04: qty 9, 30d supply, fill #0
  Filled 2022-07-24 – 2022-09-04 (×4): qty 9, 9d supply, fill #0

## 2022-07-04 MED ORDER — TAMSULOSIN HCL 0.4 MG PO CAPS: 0.4000 mg | ORAL_CAPSULE | Freq: Every day | ORAL | 1 refills | Status: DC

## 2022-07-04 MED ORDER — SUMATRIPTAN SUCCINATE 100 MG PO TABS: 100.0000 mg | ORAL_TABLET | Freq: Every day | ORAL | 11 refills | Status: DC | PRN

## 2022-07-04 MED ORDER — TIZANIDINE HCL 4 MG PO TABS
4.0000 mg | ORAL_TABLET | Freq: Four times a day (QID) | ORAL | 1 refills | Status: DC | PRN
  Filled 2022-07-04: qty 270, 68d supply, fill #0
  Filled 2022-09-01: qty 270, 68d supply, fill #1

## 2022-07-04 MED ORDER — ALPRAZOLAM 1 MG PO TABS: 1.5000 mg | ORAL_TABLET | Freq: Every evening | ORAL | 0 refills | Status: DC | PRN

## 2022-07-04 NOTE — Addendum Note (Signed)
Addended by: Sandy Salaam on: 07/04/2022 03:01 PM   Modules accepted: Orders

## 2022-07-05 ENCOUNTER — Other Ambulatory Visit: Payer: Self-pay

## 2022-07-05 ENCOUNTER — Other Ambulatory Visit: Payer: Self-pay | Admitting: Internal Medicine

## 2022-07-05 MED ORDER — SEMAGLUTIDE-WEIGHT MANAGEMENT 2.4 MG/0.75ML ~~LOC~~ SOAJ
2.4000 mg | SUBCUTANEOUS | 3 refills | Status: DC
  Filled 2022-07-05: qty 3, 28d supply, fill #0
  Filled 2022-07-30: qty 3, 28d supply, fill #1

## 2022-07-05 MED ORDER — CLINDAMYCIN PHOS-BENZOYL PEROX 1.2-5 % EX GEL
CUTANEOUS | 5 refills | Status: DC
  Filled 2022-07-05: qty 45, 30d supply, fill #0
  Filled 2022-07-30: qty 45, 30d supply, fill #1
  Filled 2022-09-01: qty 45, 30d supply, fill #2

## 2022-07-05 NOTE — Addendum Note (Signed)
Addended by: Sherlene Shams on: 07/05/2022 04:25 PM   Modules accepted: Orders

## 2022-07-05 NOTE — Telephone Encounter (Signed)
Refilled: 05/11/2021 Last OV: 12/30/2021 Next OV: 07/19/2022

## 2022-07-06 ENCOUNTER — Other Ambulatory Visit: Payer: Self-pay

## 2022-07-06 ENCOUNTER — Other Ambulatory Visit (HOSPITAL_COMMUNITY): Payer: Self-pay

## 2022-07-07 ENCOUNTER — Other Ambulatory Visit: Payer: Self-pay

## 2022-07-07 ENCOUNTER — Other Ambulatory Visit (HOSPITAL_COMMUNITY): Payer: Self-pay

## 2022-07-07 MED ORDER — LIDOCAINE-PRILOCAINE 2.5-2.5 % EX CREA
TOPICAL_CREAM | CUTANEOUS | 3 refills | Status: DC
  Filled 2022-07-07: qty 30, 30d supply, fill #0
  Filled 2022-08-06: qty 30, 30d supply, fill #1

## 2022-07-19 ENCOUNTER — Ambulatory Visit (INDEPENDENT_AMBULATORY_CARE_PROVIDER_SITE_OTHER): Payer: Commercial Managed Care - PPO | Admitting: Internal Medicine

## 2022-07-19 DIAGNOSIS — M4722 Other spondylosis with radiculopathy, cervical region: Secondary | ICD-10-CM

## 2022-07-19 NOTE — Progress Notes (Unsigned)
Patient failed to keep scheduled appointment and will be charged a no show fee.   

## 2022-07-19 NOTE — Assessment & Plan Note (Signed)
Patient failed to keep scheduled appointment and will be charged a no show fee.   

## 2022-07-24 ENCOUNTER — Other Ambulatory Visit: Payer: Self-pay

## 2022-07-24 ENCOUNTER — Ambulatory Visit (INDEPENDENT_AMBULATORY_CARE_PROVIDER_SITE_OTHER): Payer: Commercial Managed Care - PPO | Admitting: Internal Medicine

## 2022-07-24 ENCOUNTER — Encounter: Payer: Self-pay | Admitting: Internal Medicine

## 2022-07-24 ENCOUNTER — Other Ambulatory Visit (HOSPITAL_COMMUNITY): Payer: Self-pay

## 2022-07-24 ENCOUNTER — Other Ambulatory Visit: Payer: Self-pay | Admitting: Internal Medicine

## 2022-07-24 VITALS — BP 110/60 | HR 101 | Temp 98.1°F | Ht 67.0 in | Wt 197.0 lb

## 2022-07-24 DIAGNOSIS — M545 Low back pain, unspecified: Secondary | ICD-10-CM | POA: Diagnosis not present

## 2022-07-24 DIAGNOSIS — N921 Excessive and frequent menstruation with irregular cycle: Secondary | ICD-10-CM | POA: Diagnosis not present

## 2022-07-24 DIAGNOSIS — F411 Generalized anxiety disorder: Secondary | ICD-10-CM | POA: Diagnosis not present

## 2022-07-24 DIAGNOSIS — R109 Unspecified abdominal pain: Secondary | ICD-10-CM

## 2022-07-24 MED ORDER — PROCHLORPERAZINE 25 MG RE SUPP
25.0000 mg | Freq: Two times a day (BID) | RECTAL | 0 refills | Status: DC | PRN
  Filled 2022-07-24: qty 12, 6d supply, fill #0

## 2022-07-24 NOTE — Assessment & Plan Note (Signed)
Pain management has improved with weight loss but is aggravated by physical activity .  She is  using 2  tramadol daily every 12 hours as needed along with tizanidine  And gabapentin  requests for vicodin deferred given concurrent use of alprazolam and current tramadol use

## 2022-07-24 NOTE — Assessment & Plan Note (Signed)
With increased pelvic pain.  For robotic hysterectomy June 25 by [facility].  Advised to talk to her surgeon about ordered Entereg

## 2022-07-24 NOTE — Patient Instructions (Addendum)
Ask Taavon about ordering Entereg when you are admitted for your  surgery.  It is under  "restricted use"   Suspend the [facility] for now  Compazine suppositories for nausea   Try using the dicyclomine for cramping. If no improvement,  let me know and I 'll call in an alternative (Urospas or hyosycamine)

## 2022-07-24 NOTE — Progress Notes (Signed)
Subjective:  Patient ID: Tammy Christian, female    DOB: 1981/03/05  Age: 42 y.o. MRN: [id number]  CC: The primary encounter diagnosis was Generalized anxiety disorder. Diagnoses of Low back pain potentially associated with radiculopathy and Menometrorrhagia were also pertinent to this visit.   HPI Tammy Christian presents for follow up on chronic pain and anxiety  Chief Complaint  Patient presents with   Medical Management of Chronic Issues   1) she has been having increased cramping/pelvic pain (leading to vomiting)  and frequent menstruation ;  scheduled for hysterectomy but the surgery date has apparently been moved from late may to late June unbeknownst to her!  Previously managed by Ranae Plumber,   saw [facility] in February for above symptoms.    2) obesity:  h/p gastric sleeve,  weight regina,  lost > 50 lbs with wegovy ,  ow taking wegovy every 14 days,  but having nausea several days per month despite increasing  zofran  to 16 mg  for nausea ;  wants to add something. Discussed trial of compazine suppositories vs stopping wegovy  3) Chronic pain:  requesting refill on Vicodin.  Denied.  Risk of overdose with daily use of alprazolam and tramadol already elevated.     Outpatient Medications Prior to Visit  Medication Sig Dispense Refill   albuterol (VENTOLIN HFA) 108 (90 Base) MCG/ACT inhaler Inhale 2 puffs into the lungs every 4 (four) hours as needed for wheezing or shortness of breath. 6.7 g 5   ALPRAZolam (XANAX) 1 MG tablet Take 1.5 tablets (1.5 mg total) by mouth at bedtime as needed for anxiety. 45 tablet 0   Clindamycin-Benzoyl Per, Refr, gel Apply to face 2 times daily as needed 45 g 5   desloratadine (CLARINEX) 5 MG tablet TAKE 1 TABLET (5 MG TOTAL) BY MOUTH DAILY. 90 tablet 3   dicyclomine (BENTYL) 10 MG capsule Take 1 capsule (10 mg total) by mouth 4 (four) times daily -  before meals and at bedtime. 120 capsule 0   lidocaine-prilocaine (EMLA) cream APPLY  TOPICALLY AS NEEDED. 30 g 3   metoprolol succinate (TOPROL-XL) 100 MG 24 hr tablet Take 1 tablet (100 mg total) by mouth daily. Take with or immediately following a meal. 90 tablet 0   montelukast (SINGULAIR) 10 MG tablet TAKE 1 TABLET BY MOUTH AT BEDTIME. 90 tablet 3   omeprazole (PRILOSEC) 40 MG capsule Take 1 capsule (40 mg total) by mouth 2 (two) times daily before a meal. 180 capsule 3   ondansetron (ZOFRAN) 8 MG tablet TAKE 1 TABLET BY MOUTH EVERY 8 HOURS AS NEEDED 90 tablet 4   Semaglutide-Weight Management 2.4 MG/0.75ML SOAJ Inject 1 pen (2.4 mg) into the skin once a week for 28 days. 3 mL 3   SUMAtriptan (IMITREX) 100 MG tablet Take 1 tablet (100 mg total) by mouth daily as needed for migraine. 9 tablet 11   tamsulosin (FLOMAX) 0.4 MG CAPS capsule Take 1 capsule (0.4 mg total) by mouth daily. 90 capsule 1   tiZANidine (ZANAFLEX) 4 MG tablet Take 1 tablet (4 mg total) by mouth every 6 (six) hours as needed for spasm. 270 tablet 1   traMADol (ULTRAM) 50 MG tablet TAKE 2 TABLETS BY MOUTH EVERY 6 HOURS AS NEEDED. 180 tablet 0   azelastine (ASTELIN) 0.1 % nasal spray PLACE 2 SPRAYS INTO BOTH NOSTRILS 2 TIMES DAILY AS DIRECTED 30 mL 9   bimatoprost (LATISSE) 0.03 % ophthalmic solution APPLY 1 DROP TO UPPER  EYE LID AT BEDTIME AS DIRECTED (Patient not taking: Reported on 07/24/2022) 5 mL 6   escitalopram (LEXAPRO) 20 MG tablet TAKE 1 TABLET BY MOUTH EVERY DAY 90 tablet 1   mometasone (NASONEX) 50 MCG/ACT nasal spray PLACE 2 SPRAYS INTO THE NOSE DAILY. 17 g 9   Multiple Vitamins-Minerals (CENTRUM WOMEN PO)      mupirocin ointment (BACTROBAN) 2 % Apply to affected area daily 22 g 2   predniSONE (DELTASONE) 10 MG tablet 6 tablets on Day 1 , then reduce by 1 tablet daily until gone 21 tablet 0   No facility-administered medications prior to visit.    Review of Systems;  Patient denies headache, fevers, malaise, unintentional weight loss, skin rash, eye pain, sinus congestion and sinus pain, sore  throat, dysphagia,  hemoptysis , cough, dyspnea, wheezing, chest pain, palpitations, orthopnea, edema, abdominal pain, nausea, melena, diarrhea, constipation, flank pain, dysuria, hematuria, urinary  Frequency, nocturia, numbness, tingling, seizures,  Focal weakness, Loss of consciousness,  Tremor, insomnia, depression, anxiety, and suicidal ideation.      Objective:  BP 110/60   Pulse (!) 101   Temp 98.1 F (36.7 C) (Oral)   Ht 5\' 7"  (1.702 m)   Wt 197 lb (89.4 kg)   SpO2 96%   BMI 30.85 kg/m   BP Readings from Last 3 Encounters:  07/24/22 110/60  12/21/21 122/88  10/18/20 110/64    Wt Readings from Last 3 Encounters:  07/24/22 197 lb (89.4 kg)  12/30/21 194 lb (88 kg)  12/21/21 194 lb (88 kg)    Physical Exam Vitals reviewed.  Constitutional:      General: She is not in acute distress.    Appearance: Normal appearance. She is normal weight. She is not ill-appearing, toxic-appearing or diaphoretic.  HENT:     Head: Normocephalic.  Eyes:     General: No scleral icterus.       Right eye: No discharge.        Left eye: No discharge.     Conjunctiva/sclera: Conjunctivae normal.  Cardiovascular:     Rate and Rhythm: Normal rate and regular rhythm.     Heart sounds: Normal heart sounds.  Pulmonary:     Effort: Pulmonary effort is normal. No respiratory distress.     Breath sounds: Normal breath sounds.  Musculoskeletal:        General: Normal range of motion.  Skin:    General: Skin is warm and dry.  Neurological:     General: No focal deficit present.     Mental Status: She is alert and oriented to person, place, and time. Mental status is at baseline.  Psychiatric:        Mood and Affect: Mood normal.        Behavior: Behavior normal.        Thought Content: Thought content normal.        Judgment: Judgment normal.    Lab Results  Component Value Date   HGBA1C 5.3 02/20/2022   HGBA1C 5.2 05/24/2020   HGBA1C 5.1 12/15/2019    Lab Results  Component Value  Date   CREATININE 0.59 02/20/2022   CREATININE 0.69 08/23/2020   CREATININE 0.57 05/24/2020    Lab Results  Component Value Date   WBC 8.9 02/20/2022   HGB 14.3 02/20/2022   HCT 41.7 02/20/2022   PLT 412.0 (H) 02/20/2022   GLUCOSE 86 02/20/2022   CHOL 237 (H) 02/20/2022   TRIG 99.0 02/20/2022   HDL 49.90 02/20/2022  LDLDIRECT 175.0 02/20/2022   LDLCALC 167 (H) 02/20/2022   ALT 44 (H) 02/20/2022   AST 35 02/20/2022   NA 141 02/20/2022   K 3.7 02/20/2022   CL 102 02/20/2022   CREATININE 0.59 02/20/2022   BUN 6 02/20/2022   CO2 28 02/20/2022   TSH 1.37 02/20/2022   HGBA1C 5.3 02/20/2022    No results found.  Assessment & Plan:  .Generalized anxiety disorder Assessment & Plan:  Did not tolerate buspirone .  Tolerating lexapro at 20 mg daily . Using #45 alprazolam per month: 1 mg at bedtime, some days takes one during the day prn . The risks and benefits of benzodiazepine use were reviewed with patient today including excessive sedation leading to respiratory depression,  impaired thinking/driving, and addiction.  Patient was advised to avoid concurrent use with alcohol, to use medication only as needed and not to share with others  .    Low back pain potentially associated with radiculopathy Assessment & Plan: Pain management has improved with weight loss but is aggravated by physical activity .  She is  using 2  tramadol daily every 12 hours as needed along with tizanidine  And gabapentin  requests for vicodin deferred given concurrent use of alprazolam and current tramadol use    Menometrorrhagia Assessment & Plan: With increased pelvic pain.  For robotic hysterectomy June 25 by [facility].  Advised to talk to her surgeon about ordered Entereg    Other orders -     Prochlorperazine; Place 1 suppository (25 mg total) rectally every 12 (twelve) hours as needed for nausea or vomiting.  Dispense: 12 suppository; Refill: 0     I provided 33 minutes of face-to-face time  during this encounter reviewing patient's last visit with me, patient's  most recent visit with  gynecology, previous surgical and non surgical procedures, previous  labs and imaging studies, counseling on currently addressed issues,  and post visit ordering of therapeutics .   Follow-up: No follow-ups on file.   Sherlene Shams, MD

## 2022-07-24 NOTE — Assessment & Plan Note (Signed)
Did not tolerate buspirone .  Tolerating lexapro at 20 mg daily . Using #45 alprazolam per month: 1 mg at bedtime, some days takes one during the day prn . The risks and benefits of benzodiazepine use were reviewed with patient today including excessive sedation leading to respiratory depression,  impaired thinking/driving, and addiction.  Patient was advised to avoid concurrent use with alcohol, to use medication only as needed and not to share with others  .

## 2022-07-25 ENCOUNTER — Other Ambulatory Visit (HOSPITAL_COMMUNITY): Payer: Self-pay

## 2022-07-25 ENCOUNTER — Other Ambulatory Visit: Payer: Self-pay

## 2022-07-25 MED ORDER — DICYCLOMINE HCL 10 MG PO CAPS
10.0000 mg | ORAL_CAPSULE | Freq: Three times a day (TID) | ORAL | 0 refills | Status: DC
Start: 2022-07-25 — End: 2022-09-01
  Filled 2022-07-25: qty 120, 30d supply, fill #0

## 2022-07-30 ENCOUNTER — Encounter: Payer: Self-pay | Admitting: Internal Medicine

## 2022-07-30 ENCOUNTER — Other Ambulatory Visit: Payer: Self-pay | Admitting: Internal Medicine

## 2022-07-31 ENCOUNTER — Other Ambulatory Visit (HOSPITAL_COMMUNITY): Payer: Self-pay

## 2022-07-31 ENCOUNTER — Ambulatory Visit: Payer: Commercial Managed Care - PPO | Admitting: Internal Medicine

## 2022-07-31 ENCOUNTER — Other Ambulatory Visit: Payer: Self-pay

## 2022-07-31 MED ORDER — TRAMADOL HCL 50 MG PO TABS: 100.0000 mg | ORAL_TABLET | Freq: Four times a day (QID) | ORAL | 5 refills | Status: DC | PRN

## 2022-07-31 MED ORDER — MONTELUKAST SODIUM 10 MG PO TABS
10.0000 mg | ORAL_TABLET | Freq: Every day | ORAL | 3 refills | Status: DC
  Filled 2022-07-31: qty 90, 90d supply, fill #0
  Filled 2022-09-01: qty 90, 90d supply, fill #1

## 2022-07-31 MED ORDER — ALPRAZOLAM 1 MG PO TABS: 1.5000 mg | ORAL_TABLET | Freq: Every evening | ORAL | 5 refills | Status: DC | PRN

## 2022-07-31 NOTE — Telephone Encounter (Signed)
Alprazolam Refilled: 07/01/2022   Tramadol Refilled: 07/04/2022  Last OV: 07/24/2022 Next OV: not scheduled

## 2022-08-01 ENCOUNTER — Other Ambulatory Visit: Payer: Self-pay

## 2022-08-06 ENCOUNTER — Other Ambulatory Visit: Payer: Self-pay | Admitting: Internal Medicine

## 2022-08-07 ENCOUNTER — Other Ambulatory Visit: Payer: Self-pay

## 2022-08-07 ENCOUNTER — Other Ambulatory Visit (HOSPITAL_COMMUNITY): Payer: Self-pay

## 2022-08-07 MED ORDER — METOPROLOL SUCCINATE ER 100 MG PO TB24
100.0000 mg | ORAL_TABLET | Freq: Every day | ORAL | 1 refills | Status: DC
  Filled 2022-08-07: qty 90, 90d supply, fill #0
  Filled 2022-09-01: qty 90, 90d supply, fill #1

## 2022-08-09 ENCOUNTER — Ambulatory Visit: Payer: Commercial Managed Care - PPO | Admitting: Internal Medicine

## 2022-08-22 ENCOUNTER — Other Ambulatory Visit: Payer: Self-pay | Admitting: Obstetrics and Gynecology

## 2022-08-22 DIAGNOSIS — N946 Dysmenorrhea, unspecified: Secondary | ICD-10-CM | POA: Diagnosis not present

## 2022-08-23 ENCOUNTER — Encounter (HOSPITAL_BASED_OUTPATIENT_CLINIC_OR_DEPARTMENT_OTHER): Payer: Self-pay | Admitting: Obstetrics and Gynecology

## 2022-08-24 ENCOUNTER — Other Ambulatory Visit: Payer: Self-pay | Admitting: Internal Medicine

## 2022-08-24 ENCOUNTER — Encounter (HOSPITAL_BASED_OUTPATIENT_CLINIC_OR_DEPARTMENT_OTHER): Payer: Self-pay | Admitting: Obstetrics and Gynecology

## 2022-08-24 ENCOUNTER — Other Ambulatory Visit: Payer: Self-pay

## 2022-08-24 NOTE — Progress Notes (Signed)
Your procedure is scheduled on Tuesday, 09/12/22.  Report to [facility] AT  11:30 AM.   Call this number if you have problems the morning of surgery  :[phone].   OUR ADDRESS IS [address].  WE ARE LOCATED IN [facility].  PLEASE BRING YOUR INSURANCE CARD AND PHOTO ID DAY OF SURGERY.  ONLY 2 PEOPLE ARE ALLOWED IN  WAITING  ROOM                                      REMEMBER:  DO NOT EAT FOOD, CANDY GUM OR MINTS  AFTER MIDNIGHT THE NIGHT BEFORE YOUR SURGERY . YOU MAY HAVE CLEAR LIQUIDS FROM MIDNIGHT THE NIGHT BEFORE YOUR SURGERY UNTIL  10:30 AM. NO CLEAR LIQUIDS AFTER   10:30 AM DAY OF SURGERY.  YOU MAY  BRUSH YOUR TEETH MORNING OF SURGERY AND RINSE YOUR MOUTH OUT, NO CHEWING GUM CANDY OR MINTS.     CLEAR LIQUID DIET    Allowed      Water                                                                   Coffee and tea, regular and decaf  (NO cream or milk products of any type, may sweeten)                         Carbonated beverages, regular and diet                                    Sports drinks like Gatorade _____________________________________________________________________     TAKE ONLY THESE MEDICATIONS MORNING OF SURGERY: Metoprolol Take the following medications if needed: Albuterol inhaler (please bring), Clarinex, Metoprolol, Omeprazole, Zofran, Imitrex, Flomax, Tizanidine, Tramadol                                        DO NOT WEAR JEWERLY/  METAL/  PIERCINGS (INCLUDING NO PLASTIC PIERCINGS) DO NOT WEAR LOTIONS, POWDERS, PERFUMES OR NAIL POLISH ON YOUR FINGERNAILS. TOENAIL POLISH IS OK TO WEAR. DO NOT SHAVE FOR 48 HOURS PRIOR TO DAY OF SURGERY.  CONTACTS, GLASSES, OR DENTURES MAY NOT BE WORN TO SURGERY.  REMEMBER: NO SMOKING, VAPING ,  DRUGS OR ALCOHOL FOR 24 HOURS BEFORE YOUR SURGERY.                                    [facility] IS NOT RESPONSIBLE  FOR ANY BELONGINGS.                                                                     Marland Kitchen  [place] - Preparing for Surgery Before surgery, you can play an important role.  Because skin is not sterile, your skin needs to be as free of germs as possible.  You can reduce the number of germs on your skin by washing with CHG (chlorahexidine gluconate) soap before surgery.  CHG is an antiseptic cleaner which kills germs and bonds with the skin to continue killing germs even after washing. Please DO NOT use if you have an allergy to CHG or antibacterial soaps.  If your skin becomes reddened/irritated stop using the CHG and inform your nurse when you arrive at Short Stay. Do not shave (including legs and underarms) for at least 48 hours prior to the first CHG shower.  You may shave your face/neck. Please follow these instructions carefully:  1.  Shower with CHG Soap the night before surgery and the  morning of Surgery.  2.  If you choose to wash your hair, wash your hair first as usual with your  normal  shampoo.  3.  After you shampoo, rinse your hair and body thoroughly to remove the  shampoo.                                        4.  Use CHG as you would any other liquid soap.  You can apply chg directly  to the skin and wash , chg soap provided, night before and morning of your surgery.  5.  Apply the CHG Soap to your body ONLY FROM THE NECK DOWN.   Do not use on face/ open                           Wound or open sores. Avoid contact with eyes, ears mouth and genitals (private parts).                       Wash face,  Genitals (private parts) with your normal soap.             6.  Wash thoroughly, paying special attention to the area where your surgery  will be performed.  7.  Thoroughly rinse your body with warm water from the neck down.  8.  DO NOT shower/wash with your normal soap after using and rinsing off  the CHG Soap.             9.  Pat yourself dry with a clean towel.            10.  Wear clean pajamas.            11.  Place clean  sheets on your bed the night of your first shower and do not  sleep with pets. Day of Surgery : Do not apply any lotions/ powders the morning of surgery.  Please wear clean clothes to the hospital/surgery center.  IF YOU HAVE ANY SKIN IRRITATION OR PROBLEMS WITH THE SURGICAL SOAP, PLEASE GET A BAR OF GOLD DIAL SOAP AND SHOWER THE NIGHT BEFORE YOUR SURGERY AND THE MORNING OF YOUR SURGERY. PLEASE LET THE NURSE KNOW MORNING OF YOUR SURGERY IF YOU HAD ANY PROBLEMS WITH THE SURGICAL SOAP.   YOUR SURGEON MAY HAVE REQUESTED EXTENDED RECOVERY TIME AFTER YOUR SURGERY. IT COULD BE A  JUST A FEW HOURS  UP TO AN OVERNIGHT STAY.  YOUR SURGEON SHOULD HAVE DISCUSSED  THIS WITH YOU PRIOR TO YOUR SURGERY. IN THE EVENT YOU NEED TO STAY OVERNIGHT PLEASE REFER TO THE FOLLOWING GUIDELINES. YOU MAY HAVE UP TO 4 VISITORS  MAY VISIT IN THE EXTENDED RECOVERY ROOM UNTIL 800 PM ONLY.  ONE  VISITOR AGE 21 AND OVER MAY SPEND THE NIGHT AND MUST BE IN EXTENDED RECOVERY ROOM NO LATER THAN 800 PM . YOUR DISCHARGE TIME AFTER YOU SPEND THE NIGHT IS 900 AM THE MORNING AFTER YOUR SURGERY. YOU MAY PACK A SMALL OVERNIGHT BAG WITH TOILETRIES FOR YOUR OVERNIGHT STAY IF YOU WISH.  REGARDLESS OF IF YOU STAY OVER NIGHT OR ARE DISCHARGED THE SAME DAY YOU WILL BE REQUIRED TO HAVE A RESPONSIBLE ADULT (18 YRS OLD OR OLDER) STAY WITH YOU FOR AT LEAST THE FIRST 24 HOURS  YOUR PRESCRIPTION MEDICATIONS WILL BE PROVIDED DURING YOUR HOSPITAL STAY.  ________________________________________________________________________                                                        QUESTIONS Mechele Claude PRE OP NURSE PHONE [phone].

## 2022-08-24 NOTE — Progress Notes (Signed)
Spoke w/ via phone for pre-op interview---[name] Lab needs dos---- serum HCG              Lab results------09/08/22 lab appt for cbc, type & screen COVID test -----patient states asymptomatic no test needed Arrive at -------1130 on Tuesday, 09/12/22 NPO after MN NO Solid Food.  Clear liquids from MN until---1030 Med rec completed Medications to take morning of surgery -----Metoprolol, Take the following meds as needed: Albuterol, Clarinex, Metoprolol, Omeprazole, Zofran, Imitrex, Flomax, Tizanidine, Tramadol As of 08/24/22, patient stated that she had stopped Wegovy until after surgery on 09/12/22. She was aware that [facility] had to be held at least 7 days prior to surgery. Diabetic medication -----n/a Patient instructed no nail polish to be worn day of surgery Patient instructed to bring photo id and insurance card day of surgery Patient aware to have Driver (ride ) / caregiver    for 24 hours after surgery - mom, Vikki Ports Patient Special Instructions -----Extended / overnight stay instructions given. Please bring inhaler on day of surgery. Pre-Op special Instructions -----none Patient verbalized understanding of instructions that were given at this phone interview. Patient denies shortness of breath, chest pain, fever, cough at this phone interview.

## 2022-08-25 MED ORDER — PROCHLORPERAZINE 25 MG RE SUPP
25.0000 mg | Freq: Two times a day (BID) | RECTAL | 0 refills | Status: DC | PRN
  Filled 2022-08-25: qty 12, 6d supply, fill #0

## 2022-08-25 NOTE — Telephone Encounter (Signed)
Okay to fill last OV5/6/24

## 2022-08-26 ENCOUNTER — Other Ambulatory Visit (HOSPITAL_COMMUNITY): Payer: Self-pay

## 2022-08-28 ENCOUNTER — Other Ambulatory Visit: Payer: Self-pay

## 2022-09-01 ENCOUNTER — Other Ambulatory Visit (HOSPITAL_COMMUNITY): Payer: Self-pay

## 2022-09-01 ENCOUNTER — Other Ambulatory Visit: Payer: Self-pay | Admitting: Family

## 2022-09-01 ENCOUNTER — Encounter: Payer: Self-pay | Admitting: Internal Medicine

## 2022-09-01 ENCOUNTER — Other Ambulatory Visit: Payer: Self-pay

## 2022-09-01 ENCOUNTER — Other Ambulatory Visit: Payer: Self-pay | Admitting: Internal Medicine

## 2022-09-01 DIAGNOSIS — R109 Unspecified abdominal pain: Secondary | ICD-10-CM

## 2022-09-01 MED ORDER — DICYCLOMINE HCL 10 MG PO CAPS
10.0000 mg | ORAL_CAPSULE | Freq: Three times a day (TID) | ORAL | 0 refills | Status: DC
Start: 2022-09-01 — End: 2022-12-06
  Filled 2022-09-01: qty 120, 30d supply, fill #0

## 2022-09-04 ENCOUNTER — Other Ambulatory Visit: Payer: Self-pay

## 2022-09-07 ENCOUNTER — Other Ambulatory Visit: Payer: Self-pay

## 2022-09-07 ENCOUNTER — Other Ambulatory Visit (HOSPITAL_COMMUNITY): Payer: Self-pay

## 2022-09-07 MED ORDER — VITAMIN D (ERGOCALCIFEROL) 1.25 MG (50000 UNIT) PO CAPS
50000.0000 [IU] | ORAL_CAPSULE | ORAL | 1 refills | Status: DC
  Filled 2022-09-07: qty 12, 84d supply, fill #0

## 2022-09-07 MED ORDER — SEMAGLUTIDE-WEIGHT MANAGEMENT 2.4 MG/0.75ML ~~LOC~~ SOAJ
2.4000 mg | SUBCUTANEOUS | 2 refills | Status: AC
  Filled 2022-09-07 – 2022-09-11 (×2): qty 3, 28d supply, fill #0

## 2022-09-07 MED ORDER — CYANOCOBALAMIN 1000 MCG/ML IJ SOLN
1000.0000 ug | INTRAMUSCULAR | 0 refills | Status: DC
  Filled 2022-09-07: qty 3, 90d supply, fill #0

## 2022-09-07 NOTE — Addendum Note (Signed)
Addended by: Benedict Needy on: 09/07/2022 09:06 AM   Modules accepted: Orders

## 2022-09-08 ENCOUNTER — Encounter (HOSPITAL_COMMUNITY)
Admission: RE | Admit: 2022-09-08 | Discharge: 2022-09-08 | Disposition: A | Discharge status: Home / Self Care | Payer: Commercial Managed Care - PPO | Source: Ambulatory Visit | Attending: Obstetrics and Gynecology | Admitting: Obstetrics and Gynecology

## 2022-09-08 DIAGNOSIS — Z01818 Encounter for other preprocedural examination: Secondary | ICD-10-CM | POA: Insufficient documentation

## 2022-09-08 LAB — CBC
HCT: 37.9 % (ref 36.0–46.0)
Hemoglobin: 12.2 g/dL (ref 12.0–15.0)
MCH: 26 pg (ref 26.0–34.0)
MCHC: 32.2 g/dL (ref 30.0–36.0)
MCV: 80.6 fL (ref 80.0–100.0)
Platelets: 381 10*3/uL (ref 150–400)
RBC: 4.7 MIL/uL (ref 3.87–5.11)
RDW: 15.9 % — ABNORMAL HIGH (ref 11.5–15.5)
WBC: 10.5 10*3/uL (ref 4.0–10.5)
nRBC: 0 % (ref 0.0–0.2)

## 2022-09-11 ENCOUNTER — Other Ambulatory Visit: Payer: Self-pay | Admitting: Internal Medicine

## 2022-09-11 ENCOUNTER — Other Ambulatory Visit (HOSPITAL_COMMUNITY): Payer: Self-pay

## 2022-09-11 MED ORDER — PROCHLORPERAZINE 25 MG RE SUPP
25.0000 mg | Freq: Two times a day (BID) | RECTAL | 0 refills | Status: DC | PRN
  Filled 2022-09-11: qty 12, 6d supply, fill #0

## 2022-09-11 NOTE — Telephone Encounter (Signed)
Refilled: 08/25/2022 Last OV: 07/24/2022 Next OV: not scheduled

## 2022-09-12 ENCOUNTER — Encounter (HOSPITAL_BASED_OUTPATIENT_CLINIC_OR_DEPARTMENT_OTHER)
Admission: RE | Disposition: A | Discharge status: Home / Self Care | Payer: Self-pay | Source: Ambulatory Visit | Attending: Obstetrics and Gynecology

## 2022-09-12 ENCOUNTER — Ambulatory Visit (HOSPITAL_BASED_OUTPATIENT_CLINIC_OR_DEPARTMENT_OTHER): Payer: Commercial Managed Care - PPO | Admitting: Anesthesiology

## 2022-09-12 ENCOUNTER — Encounter (HOSPITAL_BASED_OUTPATIENT_CLINIC_OR_DEPARTMENT_OTHER): Payer: Self-pay | Admitting: Obstetrics and Gynecology

## 2022-09-12 ENCOUNTER — Ambulatory Visit (HOSPITAL_BASED_OUTPATIENT_CLINIC_OR_DEPARTMENT_OTHER)
Admission: RE | Admit: 2022-09-12 | Discharge: 2022-09-13 | Disposition: A | Discharge status: Home / Self Care | Payer: Commercial Managed Care - PPO | Source: Ambulatory Visit | Attending: Obstetrics and Gynecology | Admitting: Obstetrics and Gynecology

## 2022-09-12 ENCOUNTER — Other Ambulatory Visit: Payer: Self-pay

## 2022-09-12 DIAGNOSIS — N72 Inflammatory disease of cervix uteri: Secondary | ICD-10-CM | POA: Insufficient documentation

## 2022-09-12 DIAGNOSIS — N8003 Adenomyosis of the uterus: Secondary | ICD-10-CM | POA: Diagnosis not present

## 2022-09-12 DIAGNOSIS — Z01818 Encounter for other preprocedural examination: Secondary | ICD-10-CM

## 2022-09-12 DIAGNOSIS — N946 Dysmenorrhea, unspecified: Secondary | ICD-10-CM | POA: Diagnosis not present

## 2022-09-12 DIAGNOSIS — N809 Endometriosis, unspecified: Secondary | ICD-10-CM | POA: Diagnosis not present

## 2022-09-12 DIAGNOSIS — N945 Secondary dysmenorrhea: Secondary | ICD-10-CM | POA: Insufficient documentation

## 2022-09-12 DIAGNOSIS — N736 Female pelvic peritoneal adhesions (postinfective): Secondary | ICD-10-CM | POA: Diagnosis not present

## 2022-09-12 DIAGNOSIS — J45909 Unspecified asthma, uncomplicated: Secondary | ICD-10-CM

## 2022-09-12 DIAGNOSIS — F418 Other specified anxiety disorders: Secondary | ICD-10-CM

## 2022-09-12 DIAGNOSIS — Z90710 Acquired absence of both cervix and uterus: Secondary | ICD-10-CM

## 2022-09-12 DIAGNOSIS — N858 Other specified noninflammatory disorders of uterus: Secondary | ICD-10-CM | POA: Diagnosis not present

## 2022-09-12 HISTORY — DX: Personal history of urinary calculi: Z87.442

## 2022-09-12 HISTORY — PX: ROBOTIC ASSISTED LAPAROSCOPIC HYSTERECTOMY AND SALPINGECTOMY: SHX6379

## 2022-09-12 LAB — TYPE AND SCREEN
ABO/RH(D): A POS
Antibody Screen: NEGATIVE

## 2022-09-12 LAB — HCG, SERUM, QUALITATIVE: Preg, Serum: NEGATIVE

## 2022-09-12 SURGERY — XI ROBOTIC ASSISTED LAPAROSCOPIC HYSTERECTOMY AND SALPINGECTOMY
Anesthesia: General | Laterality: Bilateral

## 2022-09-12 MED ORDER — IBUPROFEN 200 MG PO TABS: 600.0000 mg | ORAL_TABLET | Freq: Four times a day (QID) | ORAL | Status: DC

## 2022-09-12 MED ORDER — ACETAMINOPHEN 500 MG PO TABS
ORAL_TABLET | ORAL | Status: AC
  Filled 2022-09-12: qty 2

## 2022-09-12 MED ORDER — WHITE PETROLATUM EX OINT
TOPICAL_OINTMENT | CUTANEOUS | Status: AC
  Filled 2022-09-12: qty 5

## 2022-09-12 MED ORDER — POVIDONE-IODINE 10 % EX SWAB
2.0000 | Freq: Once | CUTANEOUS | Status: AC
  Administered 2022-09-12: 2 via TOPICAL

## 2022-09-12 MED ORDER — SODIUM CHLORIDE 0.9 % IV SOLN
INTRAVENOUS | Status: DC | PRN
  Administered 2022-09-12: 60 mL

## 2022-09-12 MED ORDER — ACETAMINOPHEN 500 MG PO TABS
1000.0000 mg | ORAL_TABLET | Freq: Four times a day (QID) | ORAL | Status: DC
  Administered 2022-09-12 – 2022-09-13 (×3): 1000 mg via ORAL

## 2022-09-12 MED ORDER — ONDANSETRON HCL 4 MG/2ML IJ SOLN
INTRAMUSCULAR | Status: DC | PRN
  Administered 2022-09-12: 4 mg via INTRAVENOUS

## 2022-09-12 MED ORDER — ACETAMINOPHEN 500 MG PO TABS
1000.0000 mg | ORAL_TABLET | Freq: Once | ORAL | Status: AC
  Administered 2022-09-12: 1000 mg via ORAL

## 2022-09-12 MED ORDER — METOPROLOL SUCCINATE ER 100 MG PO TB24
100.0000 mg | ORAL_TABLET | Freq: Every day | ORAL | Status: DC
  Filled 2022-09-12: qty 1

## 2022-09-12 MED ORDER — DEXTROSE IN LACTATED RINGERS 5 % IV SOLN: INTRAVENOUS | Status: DC

## 2022-09-12 MED ORDER — KETAMINE HCL 10 MG/ML IJ SOLN
INTRAMUSCULAR | Status: DC | PRN
  Administered 2022-09-12: 30 mg via INTRAVENOUS

## 2022-09-12 MED ORDER — PROPOFOL 10 MG/ML IV BOLUS
INTRAVENOUS | Status: DC | PRN
  Administered 2022-09-12: 150 mg via INTRAVENOUS

## 2022-09-12 MED ORDER — PHENYLEPHRINE 80 MCG/ML (10ML) SYRINGE FOR IV PUSH (FOR BLOOD PRESSURE SUPPORT)
PREFILLED_SYRINGE | INTRAVENOUS | Status: DC | PRN
  Administered 2022-09-12: 160 ug via INTRAVENOUS
  Administered 2022-09-12: 240 ug via INTRAVENOUS

## 2022-09-12 MED ORDER — FENTANYL CITRATE (PF) 100 MCG/2ML IJ SOLN
25.0000 ug | INTRAMUSCULAR | Status: DC | PRN
  Administered 2022-09-12 (×2): 50 ug via INTRAVENOUS

## 2022-09-12 MED ORDER — BUPIVACAINE HCL (PF) 0.25 % IJ SOLN
INTRAMUSCULAR | Status: DC | PRN
  Administered 2022-09-12: 20 mL

## 2022-09-12 MED ORDER — MIDAZOLAM HCL 2 MG/2ML IJ SOLN
INTRAMUSCULAR | Status: AC
  Filled 2022-09-12: qty 2

## 2022-09-12 MED ORDER — OXYCODONE HCL 5 MG PO TABS
ORAL_TABLET | ORAL | Status: AC
  Filled 2022-09-12: qty 2

## 2022-09-12 MED ORDER — KETOROLAC TROMETHAMINE 30 MG/ML IJ SOLN
30.0000 mg | Freq: Four times a day (QID) | INTRAMUSCULAR | Status: DC
  Administered 2022-09-12 – 2022-09-13 (×3): 30 mg via INTRAVENOUS

## 2022-09-12 MED ORDER — KETOROLAC TROMETHAMINE 30 MG/ML IJ SOLN
INTRAMUSCULAR | Status: AC
  Filled 2022-09-12: qty 1

## 2022-09-12 MED ORDER — ROCURONIUM BROMIDE 10 MG/ML (PF) SYRINGE
PREFILLED_SYRINGE | INTRAVENOUS | Status: DC | PRN
  Administered 2022-09-12: 100 mg via INTRAVENOUS

## 2022-09-12 MED ORDER — DEXAMETHASONE SODIUM PHOSPHATE 10 MG/ML IJ SOLN
INTRAMUSCULAR | Status: DC | PRN
  Administered 2022-09-12: 10 mg via INTRAVENOUS

## 2022-09-12 MED ORDER — KETAMINE HCL 50 MG/5ML IJ SOSY
PREFILLED_SYRINGE | INTRAMUSCULAR | Status: AC
  Filled 2022-09-12: qty 5

## 2022-09-12 MED ORDER — FENTANYL CITRATE (PF) 100 MCG/2ML IJ SOLN
INTRAMUSCULAR | Status: AC
  Filled 2022-09-12: qty 2

## 2022-09-12 MED ORDER — CEFAZOLIN SODIUM-DEXTROSE 2-4 GM/100ML-% IV SOLN
INTRAVENOUS | Status: AC
  Filled 2022-09-12: qty 100

## 2022-09-12 MED ORDER — KETOROLAC TROMETHAMINE 30 MG/ML IJ SOLN
INTRAMUSCULAR | Status: DC | PRN
  Administered 2022-09-12: 30 mg via INTRAVENOUS

## 2022-09-12 MED ORDER — CEFAZOLIN SODIUM-DEXTROSE 2-4 GM/100ML-% IV SOLN
2.0000 g | INTRAVENOUS | Status: AC
  Administered 2022-09-12: 2 g via INTRAVENOUS

## 2022-09-12 MED ORDER — DEXAMETHASONE SODIUM PHOSPHATE 10 MG/ML IJ SOLN
INTRAMUSCULAR | Status: AC
  Filled 2022-09-12: qty 1

## 2022-09-12 MED ORDER — PROPOFOL 10 MG/ML IV BOLUS
INTRAVENOUS | Status: AC
  Filled 2022-09-12: qty 20

## 2022-09-12 MED ORDER — LACTATED RINGERS IV SOLN: INTRAVENOUS | Status: DC

## 2022-09-12 MED ORDER — MIDAZOLAM HCL 5 MG/5ML IJ SOLN
INTRAMUSCULAR | Status: DC | PRN
  Administered 2022-09-12: 2 mg via INTRAVENOUS

## 2022-09-12 MED ORDER — FENTANYL CITRATE (PF) 250 MCG/5ML IJ SOLN
INTRAMUSCULAR | Status: AC
  Filled 2022-09-12: qty 5

## 2022-09-12 MED ORDER — ONDANSETRON HCL 4 MG/2ML IJ SOLN
INTRAMUSCULAR | Status: AC
  Filled 2022-09-12: qty 2

## 2022-09-12 MED ORDER — FENTANYL CITRATE (PF) 100 MCG/2ML IJ SOLN
INTRAMUSCULAR | Status: DC | PRN
  Administered 2022-09-12 (×2): 100 ug via INTRAVENOUS
  Administered 2022-09-12: 50 ug via INTRAVENOUS

## 2022-09-12 MED ORDER — ROCURONIUM BROMIDE 10 MG/ML (PF) SYRINGE
PREFILLED_SYRINGE | INTRAVENOUS | Status: AC
  Filled 2022-09-12: qty 10

## 2022-09-12 MED ORDER — LIDOCAINE HCL (PF) 2 % IJ SOLN
INTRAMUSCULAR | Status: AC
  Filled 2022-09-12: qty 5

## 2022-09-12 MED ORDER — LIDOCAINE 2% (20 MG/ML) 5 ML SYRINGE
INTRAMUSCULAR | Status: DC | PRN
  Administered 2022-09-12: 60 mg via INTRAVENOUS

## 2022-09-12 MED ORDER — OXYCODONE HCL 5 MG PO TABS
5.0000 mg | ORAL_TABLET | ORAL | Status: DC | PRN
  Administered 2022-09-12 – 2022-09-13 (×5): 10 mg via ORAL

## 2022-09-12 SURGICAL SUPPLY — 59 items
ADH SKN CLS APL DERMABOND .7 (GAUZE/BANDAGES/DRESSINGS) ×1
BAG DRN RND TRDRP ANRFLXCHMBR (UROLOGICAL SUPPLIES) ×1
BAG URINE DRAIN 2000ML AR STRL (UROLOGICAL SUPPLIES) IMPLANT
BARRIER ADHS 3X4 INTERCEED (GAUZE/BANDAGES/DRESSINGS) IMPLANT
BRR ADH 4X3 ABS CNTRL BYND (GAUZE/BANDAGES/DRESSINGS)
CATH FOLEY 3WAY 5CC 16FR (CATHETERS) ×1 IMPLANT
COVER BACK TABLE 60X90IN (DRAPES) ×1 IMPLANT
COVER TIP SHEARS 8 DVNC (MISCELLANEOUS) ×1 IMPLANT
DEFOGGER SCOPE WARMER CLEARIFY (MISCELLANEOUS) ×1 IMPLANT
DERMABOND ADVANCED .7 DNX12 (GAUZE/BANDAGES/DRESSINGS) ×1 IMPLANT
DRAPE ARM DVNC X/XI (DISPOSABLE) ×4 IMPLANT
DRAPE COLUMN DVNC XI (DISPOSABLE) ×1 IMPLANT
DRAPE SURG IRRIG POUCH 19X23 (DRAPES) ×1 IMPLANT
DRAPE UTILITY XL STRL (DRAPES) ×1 IMPLANT
DRIVER NDL MEGA SUTCUT DVNCXI (INSTRUMENTS) ×1 IMPLANT
DRIVER NDLE MEGA SUTCUT DVNCXI (INSTRUMENTS) ×1 IMPLANT
DURAPREP 26ML APPLICATOR (WOUND CARE) ×1 IMPLANT
ELECT REM PT RETURN 9FT ADLT (ELECTROSURGICAL) ×1
ELECTRODE REM PT RTRN 9FT ADLT (ELECTROSURGICAL) ×1 IMPLANT
FORCEPS BPLR LNG DVNC XI (INSTRUMENTS) ×1 IMPLANT
FORCEPS LONG TIP 8 DVNC XI (FORCEP) ×1 IMPLANT
GAUZE 4X4 16PLY ~~LOC~~+RFID DBL (SPONGE) IMPLANT
GLOVE BIO SURGEON STRL SZ7.5 (GLOVE) ×3 IMPLANT
HOLDER FOLEY CATH W/STRAP (MISCELLANEOUS) IMPLANT
IRRIG SUCT STRYKERFLOW 2 WTIP (MISCELLANEOUS) ×1
IRRIGATION SUCT STRKRFLW 2 WTP (MISCELLANEOUS) ×1 IMPLANT
KIT PINK PAD W/HEAD ARE REST (MISCELLANEOUS) ×1
KIT PINK PAD W/HEAD ARM REST (MISCELLANEOUS) ×1 IMPLANT
KIT TURNOVER CYSTO (KITS) ×1 IMPLANT
LEGGING LITHOTOMY PAIR STRL (DRAPES) ×1 IMPLANT
NDL INSUFFLATION 14GA 150MM (NEEDLE) ×1 IMPLANT
NEEDLE INSUFFLATION 14GA 150MM (NEEDLE) ×1 IMPLANT
OBTURATOR OPTICAL STND 8 DVNC (TROCAR) ×1
OBTURATOR OPTICALSTD 8 DVNC (TROCAR) ×1 IMPLANT
OCCLUDER COLPOPNEUMO (BALLOONS) ×1 IMPLANT
PACK ROBOT WH (CUSTOM PROCEDURE TRAY) ×1 IMPLANT
PACK ROBOTIC GOWN (GOWN DISPOSABLE) ×1 IMPLANT
PAD OB MATERNITY 4.3X12.25 (PERSONAL CARE ITEMS) ×1 IMPLANT
PAD PREP 24X48 CUFFED NSTRL (MISCELLANEOUS) ×1 IMPLANT
PROTECTOR NERVE ULNAR (MISCELLANEOUS) ×2 IMPLANT
SCISSORS MNPLR CVD DVNC XI (INSTRUMENTS) ×1 IMPLANT
SEAL UNIV 5-12 XI (MISCELLANEOUS) ×3 IMPLANT
SET IRRIG Y TYPE TUR BLADDER L (SET/KITS/TRAYS/PACK) IMPLANT
SET TRI-LUMEN FLTR TB AIRSEAL (TUBING) ×1 IMPLANT
SLEEVE SCD COMPRESS KNEE MED (STOCKING) ×1 IMPLANT
SPIKE FLUID TRANSFER (MISCELLANEOUS) ×2 IMPLANT
SUT VIC AB 0 CT1 27 (SUTURE) ×1
SUT VIC AB 0 CT1 27XBRD ANBCTR (SUTURE) ×1 IMPLANT
SUT VICRYL 0 UR6 27IN ABS (SUTURE) ×1 IMPLANT
SUT VICRYL RAPIDE 4/0 PS 2 (SUTURE) ×2 IMPLANT
SUT VLOC 180 0 9IN GS21 (SUTURE) ×1 IMPLANT
TIP RUMI ORANGE 6.7MMX12CM (TIP) IMPLANT
TIP UTERINE 5.1X6CM LAV DISP (MISCELLANEOUS) IMPLANT
TIP UTERINE 6.7X10CM GRN DISP (MISCELLANEOUS) IMPLANT
TIP UTERINE 6.7X6CM WHT DISP (MISCELLANEOUS) IMPLANT
TIP UTERINE 6.7X8CM BLUE DISP (MISCELLANEOUS) IMPLANT
TOWEL OR 17X24 6PK STRL BLUE (TOWEL DISPOSABLE) ×1 IMPLANT
TROCAR PORT AIRSEAL 8X120 (TROCAR) ×1 IMPLANT
WATER STERILE IRR 1000ML POUR (IV SOLUTION) ×1 IMPLANT

## 2022-09-12 NOTE — H&P (Signed)
Tammy Christian is an 42 y.o. female. Secondary dysmenorrhea refractory to IUD and OCP for surgical intervention.  Pertinent Gynecological History: Menses: flow is moderate Bleeding: dysfunctional uterine bleeding Contraception: none DES exposure: denies Blood transfusions: none Sexually transmitted diseases: no past history Previous GYN Procedures:  na   Last mammogram: normal Date: 2023 Last pap: normal Date: 2023 OB History: G0, P0   Menstrual History: Menarche age: 50 Patient's last menstrual period was 08/19/2022 (approximate).    Past Medical History:  Diagnosis Date   Abnormal Pap smear of cervix 2012   Abortion 2001   Allergy    Anxiety    panic disorder   Arthritis    due to spinal fusion in lumbar region   Asthma    very mild, mostly humid weather   Elevated liver enzymes 2023   Gastritis 2005   History of kidney stones    Hyperlipidemia    Follows with Dr.Tullo, PCP.   Migraines    Follows w/ PCP, Dr. Julienne Kass.   Obesity    Panic disorder    Follows w/ PCP, Dr. Julienne Kass.   Vitamin D deficiency disease     Past Surgical History:  Procedure Laterality Date   BREAST BIOPSY Right 02/04/2001   Negative c Clip   CHOLECYSTECTOMY  04/08/2012   COLONOSCOPY WITH PROPOFOL N/A 07/12/2017   Procedure: COLONOSCOPY WITH PROPOFOL;  Surgeon: Toney Reil, MD;  Location: [facility] ENDOSCOPY;  Service: Gastroenterology;  Laterality: N/A;   COLPOSCOPY  2012   ESOPHAGOGASTRODUODENOSCOPY (EGD) WITH PROPOFOL N/A 07/12/2017   Procedure: ESOPHAGOGASTRODUODENOSCOPY (EGD) WITH PROPOFOL;  Surgeon: Toney Reil, MD;  Location: [facility] ENDOSCOPY;  Service: Gastroenterology;  Laterality: N/A;   KNEE ARTHROSCOPY Left 05/01/2016   Procedure: ARTHROSCOPY KNEE LATERAL RELEASE;  Surgeon: Kennedy Bucker, MD;  Location: [facility] ORS;  Service: Orthopedics;  Laterality: Left;   laprascopic sleeve  04/08/2012   GASTRIC   SPINAL FUSION  09/25/2016    Family History  Problem Relation Age of  Onset   Cancer Maternal Grandmother        LUNG, THROAT   Macular degeneration Maternal Grandmother    Heart disease Paternal Grandmother    Alzheimer's disease Paternal Grandmother    Stroke Paternal Grandmother    Cancer Paternal Grandfather        COLON   Diabetes Father    Heart disease Father        atrial fib   Hyperlipidemia Father    Hypertension Father    Cancer Father 66       colon polyps   Colon polyps Father     Social History:  reports that she has never smoked. She has never used smokeless tobacco. She reports current alcohol use. She reports that she does not use drugs.  Allergies: No Known Allergies  No medications prior to admission.    Review of Systems  Constitutional: Negative.   All other systems reviewed and are negative.   Weight 90.7 kg, last menstrual period 08/19/2022. Physical Exam Constitutional:      Appearance: Normal appearance. She is normal weight.  HENT:     Head: Normocephalic and atraumatic.  Cardiovascular:     Rate and Rhythm: Normal rate and regular rhythm.     Pulses: Normal pulses.     Heart sounds: Normal heart sounds.  Pulmonary:     Effort: Pulmonary effort is normal.     Breath sounds: Normal breath sounds.  Genitourinary:    General: Normal vulva.  Musculoskeletal:  General: Normal range of motion.     Cervical back: Normal range of motion and neck supple.  Skin:    General: Skin is warm.  Neurological:     General: No focal deficit present.     Mental Status: She is alert.  Psychiatric:        Mood and Affect: Mood normal.     No results found for this or any previous visit (from the past 24 hour(s)).  No results found.  Assessment/Plan: Secondary dysmenorrhea refractory to medical intervention. ? Adenomyosis ? Endometriosis. Desires definitive surgery.  daVinci assist TLH and bilateral salpingectomy. Surgical risks discussed. Consent done. Risks of anesthesia , infection, bleeding, injury to  abdominal organs with need for repair discussed.   Jacci Ruberg J 09/12/2022, 7:42 AM

## 2022-09-12 NOTE — Op Note (Signed)
09/12/2022  3:56 PM  PATIENT:  Ranae Plumber Manganaro  42 y.o. female  PRE-OPERATIVE DIAGNOSIS:  Dysmenorrhea  POST-OPERATIVE DIAGNOSIS:  Dysmenorrhea  PROCEDURE:  Procedure(s): XI ROBOTIC ASSISTED LAPAROSCOPIC HYSTERECTOMY  BILATERAL SALPINGECTOMY LYSIS OF ADHESIONS MCCALL CUL DE PLASTY  SURGEON:  Dymphna Wadley  ASSISTANTS: LAW     ANESTHESIA:   local and general  ESTIMATED BLOOD LOSS: 50 mL   DRAINS: none   LOCAL MEDICATIONS USED:  MARCAINE    and Amount: 20 ml  SPECIMEN:  Source of Specimen:  UTERUS, CERVIX AND TUBES  DISPOSITION OF SPECIMEN:  PATHOLOGY  COUNTS:  YES  DICTATION #: [id number]  PLAN OF CARE: DC HOME  PATIENT DISPOSITION:  PACU - hemodynamically stable.

## 2022-09-12 NOTE — Anesthesia Procedure Notes (Signed)
Procedure Name: Intubation Date/Time: 09/12/2022 1:02 PM  Performed by: Briant Sites, CRNAPre-anesthesia Checklist: Patient identified, Emergency Drugs available, Suction available and Patient being monitored Patient Re-evaluated:Patient Re-evaluated prior to induction Oxygen Delivery Method: Circle system utilized Preoxygenation: Pre-oxygenation with 100% oxygen Induction Type: IV induction Ventilation: Mask ventilation without difficulty Laryngoscope Size: Mac and 3 Grade View: Grade I Tube type: Oral Number of attempts: 1 Airway Equipment and Method: Stylet and Oral airway Placement Confirmation: ETT inserted through vocal cords under direct vision, positive ETCO2 and breath sounds checked- equal and bilateral Secured at: 20 cm Tube secured with: Tape Dental Injury: Teeth and Oropharynx as per pre-operative assessment

## 2022-09-12 NOTE — Transfer of Care (Signed)
Immediate Anesthesia Transfer of Care Note  Patient: Tammy Christian  Procedure(s) Performed: XI ROBOTIC ASSISTED LAPAROSCOPIC HYSTERECTOMY AND SALPINGECTOMY (Bilateral)  Patient Location: PACU  Anesthesia Type:General  Level of Consciousness: awake, alert , and oriented  Airway & Oxygen Therapy: Patient Spontanous Breathing and Patient connected to nasal cannula oxygen  Post-op Assessment: Report given to RN  Post vital signs: Reviewed and stable  Last Vitals:  Vitals Value Taken Time  BP 123/99   Temp    Pulse 86 16  Resp    SpO2 99     Last Pain:  Vitals:   09/12/22 1205  TempSrc: Oral  PainSc: 0-No pain      Patients Stated Pain Goal: 5 (09/12/22 1205)  Complications: No notable events documented.

## 2022-09-12 NOTE — Anesthesia Preprocedure Evaluation (Addendum)
Anesthesia Evaluation  Patient identified by MRN, date of birth, ID band Patient awake    Reviewed: Allergy & Precautions, NPO status , Patient's Chart, lab work & pertinent test results, reviewed documented beta blocker date and time   Airway Mallampati: I  TM Distance: >3 FB Neck ROM: Full    Dental  (+) Poor Dentition, Chipped, Dental Advisory Given,    Pulmonary asthma    Pulmonary exam normal breath sounds clear to auscultation       Cardiovascular negative cardio ROS Normal cardiovascular exam Rhythm:Regular Rate:Normal     Neuro/Psych  Headaches PSYCHIATRIC DISORDERS Anxiety Depression       GI/Hepatic negative GI ROS, Neg liver ROS,,,  Endo/Other  negative endocrine ROS    Renal/GU negative Renal ROS  negative genitourinary   Musculoskeletal  (+) Arthritis ,    Abdominal   Peds  Hematology negative hematology ROS (+)   Anesthesia Other Findings   Reproductive/Obstetrics                             Anesthesia Physical Anesthesia Plan  ASA: 2  Anesthesia Plan: General   Post-op Pain Management: Tylenol PO (pre-op)* and Ketamine IV*   Induction: Intravenous  PONV Risk Score and Plan: 3 and Midazolam, Dexamethasone and Ondansetron  Airway Management Planned: Oral ETT  Additional Equipment:   Intra-op Plan:   Post-operative Plan: Extubation in OR  Informed Consent: I have reviewed the patients History and Physical, chart, labs and discussed the procedure including the risks, benefits and alternatives for the proposed anesthesia with the patient or authorized representative who has indicated his/her understanding and acceptance.     Dental advisory given  Plan Discussed with: CRNA  Anesthesia Plan Comments:        Anesthesia Quick Evaluation

## 2022-09-12 NOTE — Op Note (Unsigned)
Tammy Christian, [facility] MEDICAL RECORD NO: [id number] ACCOUNT NO: [id number] DATE OF BIRTH: 15-May-1980 FACILITY: [facility] LOCATION: [facility] PHYSICIAN: Lenoard Aden, MD  Operative Report   DATE OF PROCEDURE: 09/12/2022  PREOPERATIVE DIAGNOSIS:  Severe secondary dysmenorrhea, refractory to medical therapy.  POSTOPERATIVE DIAGNOSIS:  Severe secondary dysmenorrhea, refractory to medical therapy plus pelvic adhesions and mild endometriosis and small enterocele.  PROCEDURE:  da Vinci-assisted total laparoscopic hysterectomy, bilateral salpingectomy, lysis of adhesions, McCall culdoplasty, ablation of endometriosis.  SURGEON:  Lenoard Aden, MD  ASSISTANTConni Elliot.  ANESTHESIA:  Local, general.  ESTIMATED BLOOD LOSS:  50 mL  COMPLICATIONS:  None.  DRAINS:  None.  COUNTS:  Correct.  DISPOSITION:  The patient recovery in good condition.  BRIEF OPERATIVE NOTE:  After being apprised of the risks of anesthesia, infection, bleeding, injury to surrounding organs, possible need for repair, delayed versus immediate complications to include bowel and bladder injury, possible need for repair:   The patient was brought to the operating room where she was administered general anesthetic without complications.  Prepped and draped in usual sterile fashion.  Foley catheter placed.  Exam under anesthesia, anteflexed, bulky uterus.  No adnexal masses.   At this time, Rumi retractor placed without difficulty and attention turned to the abdominal portion of the procedure, whereby Veress needle placed opening pressure -2.  Three liters CO2 insufflated without difficulty.  Trocar placed atraumatically.   Visualization reveals a bulky anteflexed uterus, normal ovaries, right paratubal cyst, normal otherwise right and left tube, some mild cul-de-sac endometriosis and small enterocele.  At this time, ports were placed, two on the left and one on the right,  da Vinci ports and AirSeal port without  difficulty under direct visualization. Deep Trendelenburg position was established.  Robot was docked in the standard fashion, long bipolar graspers and monopolar scissors were placed.  At this time, ureters  identified bilaterally.  Some ablation of endometriosis, cul-de-sac was done with monopolar cautery.  The retroperitoneal space was entered.  The left ureter was identified, peristalsing medial leaf of peritoneum. Left tube was undermined using monopolar  cautery, divided at the fimbrial portion and at the uterine portion and removed.  Similarly done on the right after identification of the ureter and entered the retroperitoneal space.  The ovarian ligament on both sides were cauterized and divided.  The  broad ligament was divided bilaterally.  The bladder flap was developed sharply.  Uterine vessels were skeletonized on the right and the left and bilaterally cauterized at this time.  After both sides were bilaterally cauterized and cut bilaterally  cauterized the vessels on the left and the right are cut.  The Rumi cup is well identified.  A bladder flap was sharply developed.  The cervicovaginal junction is scored and the specimen is detached and removed from the vagina.  The vaginal incision was  inspected and found to be hemostatic and closed in 2 layers using a 0 V-Loc running suture, which was also used to place a culdoplasty suture.  At this time, all irrigation was accomplished.  Ropivacaine solution was placed.  Ureters are reinspected and  found to be peristalsing bilaterally.  The robot is then undocked.  All instruments were removed, CO2 is released.  Positive pressure applied.  Vaginal cuff inspected by the assistant and found to be intact.  Incisions closed using 0 Vicryl, 4-0 Vicryl  and Dermabond.  Vaginal incision was then reinspected by me and found to be intact and hemostatic.  Urine was  clear and copious.  The patient tolerated the procedure well, was awakened and transferred to  recovery in good condition.   PUS D: 09/12/2022 4:04:11 pm T: 09/12/2022 4:51:00 pm  JOB: [id number]/ [id number]

## 2022-09-13 ENCOUNTER — Encounter (HOSPITAL_BASED_OUTPATIENT_CLINIC_OR_DEPARTMENT_OTHER): Payer: Self-pay | Admitting: Obstetrics and Gynecology

## 2022-09-13 DIAGNOSIS — N736 Female pelvic peritoneal adhesions (postinfective): Secondary | ICD-10-CM | POA: Diagnosis not present

## 2022-09-13 DIAGNOSIS — N72 Inflammatory disease of cervix uteri: Secondary | ICD-10-CM | POA: Diagnosis not present

## 2022-09-13 DIAGNOSIS — N8003 Adenomyosis of the uterus: Secondary | ICD-10-CM | POA: Diagnosis not present

## 2022-09-13 DIAGNOSIS — N945 Secondary dysmenorrhea: Secondary | ICD-10-CM | POA: Diagnosis not present

## 2022-09-13 MED ORDER — OXYCODONE HCL 5 MG PO TABS
ORAL_TABLET | ORAL | Status: AC
  Filled 2022-09-13: qty 2

## 2022-09-13 MED ORDER — OXYCODONE-ACETAMINOPHEN 5-325 MG PO TABS
1.0000 | ORAL_TABLET | Freq: Four times a day (QID) | ORAL | 0 refills | Status: DC | PRN
Start: 2022-09-13 — End: 2022-12-06

## 2022-09-13 MED ORDER — TRAMADOL HCL 50 MG PO TABS
50.0000 mg | ORAL_TABLET | Freq: Four times a day (QID) | ORAL | 0 refills | Status: DC | PRN
Start: 2022-09-13 — End: 2022-12-06

## 2022-09-13 MED ORDER — ACETAMINOPHEN 500 MG PO TABS
ORAL_TABLET | ORAL | Status: AC
  Filled 2022-09-13: qty 2

## 2022-09-13 MED ORDER — KETOROLAC TROMETHAMINE 30 MG/ML IJ SOLN
INTRAMUSCULAR | Status: AC
  Filled 2022-09-13: qty 1

## 2022-09-13 NOTE — Anesthesia Postprocedure Evaluation (Signed)
Anesthesia Post Note  Patient: Tammy Christian  Procedure(s) Performed: XI ROBOTIC ASSISTED LAPAROSCOPIC HYSTERECTOMY AND SALPINGECTOMY (Bilateral)     Patient location during evaluation: PACU Anesthesia Type: General Level of consciousness: awake and alert Pain management: pain level controlled Vital Signs Assessment: post-procedure vital signs reviewed and stable Respiratory status: spontaneous breathing, nonlabored ventilation, respiratory function stable and patient connected to nasal cannula oxygen Cardiovascular status: blood pressure returned to baseline and stable Postop Assessment: no apparent nausea or vomiting Anesthetic complications: no  No notable events documented.  Last Vitals:  Vitals:   09/13/22 0612 09/13/22 0850  BP: 101/61 120/79  Pulse: 69 89  Resp: 18 14  Temp: 36.8 C 37.3 C  SpO2: 100% 100%    Last Pain:  Vitals:   09/13/22 0850  TempSrc:   PainSc: 5                  [name] L Dois Juarbe

## 2022-09-13 NOTE — Progress Notes (Signed)
Patient complaining of 7 out of 10 abdominal pain. States it's sore and cramping. Feels like last dose of oxycodone did nothing for her and pain and requested something for breakthrough pain. Notified Dr Billy Coast. No new orders at this time. Offered to hep patient get up to walk and also offered heating pad but patient declined both. Will give next scheduled pain medications when due.

## 2022-09-13 NOTE — Progress Notes (Signed)
1 Day Post-Op Procedure(s) (LRB): XI ROBOTIC ASSISTED LAPAROSCOPIC HYSTERECTOMY AND SALPINGECTOMY (Bilateral)  Subjective: Patient reports incisional pain, tolerating PO, + flatus, + BM, and no problems voiding.    Objective: BP 101/61 (BP Location: Left Arm)   Pulse 69   Temp 98.3 F (36.8 C)   Resp 18   Ht 5\' 7"  (1.702 m)   Wt 92.4 kg   LMP 08/19/2022 (Approximate)   SpO2 100%   BMI 31.90 kg/m   I have reviewed patient's vital signs, intake and output, medications, and labs.  General: alert, cooperative, and appears stated age Resp: clear to auscultation bilaterally Cardio: regular rate and rhythm, S1, S2 normal, no murmur, click, rub or gallop GI: soft, non-tender; bowel sounds normal; no masses,  no organomegaly Extremities: extremities normal, atraumatic, no cyanosis or edema and Homans sign is negative, no sign of DVT Vaginal Bleeding: minimal  CBC pending  Assessment: s/p Procedure(s) with comments: XI ROBOTIC ASSISTED LAPAROSCOPIC HYSTERECTOMY AND SALPINGECTOMY (Bilateral) - Requests 2 1/2 hrs.: stable, progressing well, and tolerating diet  Plan: Advance diet Encourage ambulation Discontinue IV fluids Discharge home Fu office 2w  LOS: 0 days    Lenoard Aden, MD 09/13/2022, 6:48 AM

## 2022-09-14 LAB — SURGICAL PATHOLOGY

## 2022-09-16 DIAGNOSIS — Z90710 Acquired absence of both cervix and uterus: Secondary | ICD-10-CM

## 2022-11-02 ENCOUNTER — Encounter: Payer: Self-pay | Admitting: Internal Medicine

## 2022-12-06 ENCOUNTER — Telehealth (INDEPENDENT_AMBULATORY_CARE_PROVIDER_SITE_OTHER): Payer: Self-pay | Admitting: Internal Medicine

## 2022-12-06 ENCOUNTER — Encounter: Payer: Self-pay | Admitting: Internal Medicine

## 2022-12-06 VITALS — Ht 67.0 in | Wt 200.0 lb

## 2022-12-06 DIAGNOSIS — E538 Deficiency of other specified B group vitamins: Secondary | ICD-10-CM

## 2022-12-06 DIAGNOSIS — D500 Iron deficiency anemia secondary to blood loss (chronic): Secondary | ICD-10-CM

## 2022-12-06 DIAGNOSIS — N946 Dysmenorrhea, unspecified: Secondary | ICD-10-CM

## 2022-12-06 DIAGNOSIS — R109 Unspecified abdominal pain: Secondary | ICD-10-CM

## 2022-12-06 DIAGNOSIS — I890 Lymphedema, not elsewhere classified: Secondary | ICD-10-CM

## 2022-12-06 DIAGNOSIS — E559 Vitamin D deficiency, unspecified: Secondary | ICD-10-CM

## 2022-12-06 DIAGNOSIS — F411 Generalized anxiety disorder: Secondary | ICD-10-CM

## 2022-12-06 DIAGNOSIS — M4722 Other spondylosis with radiculopathy, cervical region: Secondary | ICD-10-CM

## 2022-12-06 DIAGNOSIS — Z6841 Body Mass Index (BMI) 40.0 and over, adult: Secondary | ICD-10-CM

## 2022-12-06 DIAGNOSIS — M5387 Other specified dorsopathies, lumbosacral region: Secondary | ICD-10-CM

## 2022-12-06 DIAGNOSIS — F331 Major depressive disorder, recurrent, moderate: Secondary | ICD-10-CM

## 2022-12-06 DIAGNOSIS — N83202 Unspecified ovarian cyst, left side: Secondary | ICD-10-CM

## 2022-12-06 MED ORDER — CYANOCOBALAMIN 1000 MCG/ML IJ SOLN: 1000.0000 ug | INTRAMUSCULAR | 0 refills | Status: DC

## 2022-12-06 MED ORDER — ALPRAZOLAM 1 MG PO TABS: 1.5000 mg | ORAL_TABLET | Freq: Every evening | ORAL | 5 refills | Status: DC | PRN

## 2022-12-06 MED ORDER — TRAMADOL HCL 50 MG PO TABS: 100.0000 mg | ORAL_TABLET | Freq: Three times a day (TID) | ORAL | 5 refills | Status: DC | PRN

## 2022-12-06 MED ORDER — TIZANIDINE HCL 4 MG PO TABS: 4.0000 mg | ORAL_TABLET | Freq: Four times a day (QID) | ORAL | 5 refills | Status: DC | PRN

## 2022-12-06 MED ORDER — DICYCLOMINE HCL 10 MG PO CAPS: 10.0000 mg | ORAL_CAPSULE | Freq: Three times a day (TID) | ORAL | 0 refills | Status: DC

## 2022-12-06 NOTE — Progress Notes (Signed)
Virtual Visit via Caregility   Note   This format is felt to be most appropriate for this patient at this time.  All issues noted in this document were discussed and addressed.  No physical exam was performed (except for noted visual exam findings with Video Visits).   I connected with  Tammy Christian  on 12/06/22 at  9:00 AM EDT by a video enabled telemedicine application  and verified that I am speaking with the correct person using two identifiers. Location patient: home Location provider: work or home office Persons participating in the virtual visit: patient, provider  I discussed the limitations, risks, security and privacy concerns of performing an evaluation and management service by telephone and the availability of in person appointments. I also discussed with the patient that there may be a patient responsible charge related to this service. The patient expressed understanding and agreed to proceed.  Reason for visit: medication refill   HPI:  Since her last visit Tammy Christian underwent robotic TAH/BS on June 25 for treatment of chronic lower abd pain secondary to endometriosis and fibroids.   Post op was uncomplicated ,  expected bleeding has stopped.  Did not require transfusion.  Path report reviewed .  Pain issues have improved and she is using less tramadol daily , mostly for management of low back pain.    Obesity:  would like to resume compounded GLP 1 agonist as her insurance  will not pay for [facility]  . Current weight is 200   Chronic use of controlled substances (tramadol and  alprazolam) Refill history confirmed via [place] Controlled Substance databas, accessed by me today.Marland Kitchen   overdose risk elevated at 490 due to prior use of oxycodone  post operatively.  However her  last refill oxycodone was  July 3 for #20. By another provider    Tramadol refilled sept  4  qty 180 and alprazolam 41m g qty#45 on Sept 3   ROS: See pertinent positives and negatives per HPI.  Past Medical History:  Diagnosis Date    Abnormal Pap smear of cervix 2012   Abortion 2001   Allergy    Anxiety    panic disorder   Arthritis    due to spinal fusion in lumbar region   Asthma    very mild, mostly humid weather   Elevated liver enzymes 2023   Gastritis 2005   History of kidney stones    Hyperlipidemia    Follows with Dr.Sharif Rendell, PCP.   Migraines    Follows w/ PCP, Dr. Julienne Kass.   Obesity    Panic disorder    Follows w/ PCP, Dr. Julienne Kass.   Vitamin D deficiency disease     Past Surgical History:  Procedure Laterality Date   BREAST BIOPSY Right 02/04/2001   Negative c Clip   CHOLECYSTECTOMY  04/08/2012   COLONOSCOPY WITH PROPOFOL N/A 07/12/2017   Procedure: COLONOSCOPY WITH PROPOFOL;  Surgeon: Toney Reil, MD;  Location: [facility] ENDOSCOPY;  Service: Gastroenterology;  Laterality: N/A;   COLPOSCOPY  2012   ESOPHAGOGASTRODUODENOSCOPY (EGD) WITH PROPOFOL N/A 07/12/2017   Procedure: ESOPHAGOGASTRODUODENOSCOPY (EGD) WITH PROPOFOL;  Surgeon: Toney Reil, MD;  Location: [facility] ENDOSCOPY;  Service: Gastroenterology;  Laterality: N/A;   KNEE ARTHROSCOPY Left 05/01/2016   Procedure: ARTHROSCOPY KNEE LATERAL RELEASE;  Surgeon: Kennedy Bucker, MD;  Location: [facility] ORS;  Service: Orthopedics;  Laterality: Left;   laprascopic sleeve  04/08/2012   GASTRIC   ROBOTIC ASSISTED LAPAROSCOPIC HYSTERECTOMY AND SALPINGECTOMY Bilateral 09/12/2022   Procedure: XI ROBOTIC  ASSISTED LAPAROSCOPIC HYSTERECTOMY AND SALPINGECTOMY;  Surgeon: Olivia Mackie, MD;  [facility]: [facility];  Service: Gynecology;  Laterality: Bilateral;  Requests 2 1/2 hrs.   SPINAL FUSION  09/25/2016    Family History  Problem Relation Age of Onset   Cancer Maternal Grandmother        LUNG, THROAT   Macular degeneration Maternal Grandmother    Heart disease Paternal Grandmother    Alzheimer's disease Paternal Grandmother    Stroke Paternal Grandmother    Cancer Paternal Grandfather        COLON   Diabetes Father    Heart disease  Father        atrial fib   Hyperlipidemia Father    Hypertension Father    Cancer Father 71       colon polyps   Colon polyps Father     SOCIAL HX: unemployed currently,  reports that she has never smoked. She has never used smokeless tobacco. She reports current alcohol use. She reports that she does not use drugs.    Current Outpatient Medications:    albuterol (VENTOLIN HFA) 108 (90 Base) MCG/ACT inhaler, Inhale 2 puffs into the lungs every 4 (four) hours as needed for wheezing or shortness of breath., Disp: 6.7 g, Rfl: 5   Clindamycin-Benzoyl Per, Refr, gel, Apply to face 2 times daily as needed, Disp: 45 g, Rfl: 5   desloratadine (CLARINEX) 5 MG tablet, TAKE 1 TABLET (5 MG TOTAL) BY MOUTH DAILY. (Patient taking differently: Take by mouth as needed.), Disp: 90 tablet, Rfl: 3   metoprolol succinate (TOPROL-XL) 100 MG 24 hr tablet, Take 1 tablet (100 mg total) by mouth daily. Take with or immediately following a meal., Disp: 90 tablet, Rfl: 1   montelukast (SINGULAIR) 10 MG tablet, Take 1 tablet (10 mg total) by mouth at bedtime. (Patient taking differently: Take 10 mg by mouth as needed.), Disp: 90 tablet, Rfl: 3   Multiple Vitamin (MULTIVITAMIN WITH MINERALS) TABS tablet, Take 1 tablet by mouth daily., Disp: , Rfl:    omeprazole (PRILOSEC) 40 MG capsule, Take 1 capsule (40 mg total) by mouth 2 (two) times daily before a meal. (Patient taking differently: Take 40 mg by mouth daily.), Disp: 180 capsule, Rfl: 3   ondansetron (ZOFRAN) 8 MG tablet, TAKE 1 TABLET BY MOUTH EVERY 8 HOURS AS NEEDED (Patient taking differently: as needed.), Disp: 90 tablet, Rfl: 4   SUMAtriptan (IMITREX) 100 MG tablet, Take 1 tablet (100 mg total) by mouth daily as needed for migraine., Disp: 9 tablet, Rfl: 11   Vitamin D, Ergocalciferol, (DRISDOL) 1.25 MG (50000 UNIT) CAPS capsule, Take 1 capsule (50,000 Units total) by mouth every 7 (seven) days., Disp: 12 capsule, Rfl: 1   ALPRAZolam (XANAX) 1 MG tablet, Take 1.5  tablets (1.5 mg total) by mouth at bedtime as needed for anxiety., Disp: 45 tablet, Rfl: 5   cyanocobalamin (VITAMIN B12) 1000 MCG/ML injection, Inject 1 mL (1,000 mcg total) into the muscle every 30 (thirty) days., Disp: 10 mL, Rfl: 0   dicyclomine (BENTYL) 10 MG capsule, Take 1 capsule (10 mg total) by mouth 4 (four) times daily -  before meals and at bedtime., Disp: 120 capsule, Rfl: 0   tiZANidine (ZANAFLEX) 4 MG tablet, Take 1 tablet (4 mg total) by mouth every 6 (six) hours as needed for spasm., Disp: 60 tablet, Rfl: 5   traMADol (ULTRAM) 50 MG tablet, Take 2 tablets (100 mg total) by mouth every 8 (eight) hours as needed.,  Disp: 150 tablet, Rfl: 5  EXAM:  VITALS per patient if applicable:  GENERAL: alert, oriented, appears well and in no acute distress  HEENT: atraumatic, conjunttiva clear, no obvious abnormalities on inspection of external nose and ears  NECK: normal movements of the head and neck  LUNGS: on inspection no signs of respiratory distress, breathing rate appears normal, no obvious gross SOB, gasping or wheezing  CV: no obvious cyanosis  MS: moves all visible extremities without noticeable abnormality  PSYCH/NEURO: pleasant and cooperative, no obvious depression or anxiety, speech and thought processing grossly intact  ASSESSMENT AND PLAN: B12 deficiency -     B12 and Folate Panel; Future  Abdominal cramps -     Dicyclomine HCl; Take 1 capsule (10 mg total) by mouth 4 (four) times daily -  before meals and at bedtime.  Dispense: 120 capsule; Refill: 0  Abdominal spasms -     Dicyclomine HCl; Take 1 capsule (10 mg total) by mouth 4 (four) times daily -  before meals and at bedtime.  Dispense: 120 capsule; Refill: 0  Vitamin D deficiency  Iron deficiency anemia due to chronic blood loss Assessment & Plan: Secondary to menometrorrhagia.  Resolved s/p TAH/BSO Lab Results  Component Value Date   WBC 10.5 09/08/2022   HGB 12.2 09/08/2022   HCT 37.9 09/08/2022    MCV 80.6 09/08/2022   PLT 381 09/08/2022   Lab Results  Component Value Date   IRON 94 02/20/2022   TIBC 367 02/20/2022   FERRITIN 9 (L) 02/20/2022    Orders: -     CBC with Differential/Platelet; Future -     Iron, TIBC and Ferritin Panel; Future  Lymphedema of leg -     Comprehensive metabolic panel; Future  Cervical radiculopathy due to degenerative joint disease of spine Assessment & Plan: Managed with tramadol .and tizanidine.   Refill history confirmed via [place] Controlled Substance databas, accessed by me today.Marland Kitchen    Dysmenorrhea Assessment & Plan: Resolved s/p TAH/BSO June 2024   Generalized anxiety disorder Assessment & Plan:  Reviewed prior therapies :  she Did not tolerate buspirone .  She is Tolerating lexapro at 20 mg daily . Using #45 alprazolam per month: 1 mg at bedtime, some days takes one during the day prn . The risks and benefits of benzodiazepine use were reviewed with patient today including excessive sedation leading to respiratory depression,  impaired thinking/driving, and addiction.  Patient was advised to avoid concurrent use with alcohol, to use medication only as needed and not to share with others  .    Moderate episode of recurrent major depressive disorder [facility]) Assessment & Plan:  Improved with Break from work and with resolution of GYN issues.  Prefers to continue  use of  lexapro . No changes today    Class 3 severe obesity due to excess calories without serious comorbidity with body mass index (BMI) of 40.0 to 44.9 in adult [facility]) Assessment & Plan: She has gained weight since she had to discontinue  use of Wegovy.  She is considering getting a compounded version from a local pharmacy   Ovarian cyst, left Assessment & Plan: Follow up with Dr Jean Rosenthal for surveillance  .  She underwent TAH/BS (robotic) un 2024 Jean Rosenthal).     Sciatica of left side associated with disorder of lumbosacral spine Assessment & Plan: Pain management has  improved with weight loss and absence from work which  aggravated pain during procedural use of  lead apron .  She is using 2  tramadol daily every 8 hours as needed along with tizanidine .   Other orders -     Cyanocobalamin; Inject 1 mL (1,000 mcg total) into the muscle every 30 (thirty) days.  Dispense: 10 mL; Refill: 0 -     tiZANidine HCl; Take 1 tablet (4 mg total) by mouth every 6 (six) hours as needed for spasm.  Dispense: 60 tablet; Refill: 5 -     traMADol HCl; Take 2 tablets (100 mg total) by mouth every 8 (eight) hours as needed.  Dispense: 150 tablet; Refill: 5 -     ALPRAZolam; Take 1.5 tablets (1.5 mg total) by mouth at bedtime as needed for anxiety.  Dispense: 45 tablet; Refill: 5      I discussed the assessment and treatment plan with the patient. The patient was provided an opportunity to ask questions and all were answered. The patient agreed with the plan and demonstrated an understanding of the instructions.   The patient was advised to call back or seek an in-person evaluation if the symptoms worsen or if the condition fails to improve as anticipated.   I spent 30 minutes dedicated to the care of this patient on the date of this encounter to include pre-visit review of her medical history,  including recent GYN surgery,  Face-to-face time with the patient , and post visit ordering of testing and therapeutics.    Sherlene Shams, MD

## 2022-12-08 NOTE — Assessment & Plan Note (Signed)
She has gained weight since she had to discontinue  use of Wegovy.  She is considering getting a compounded version from a local pharmacy

## 2022-12-08 NOTE — Assessment & Plan Note (Signed)
Reviewed prior therapies :  she Did not tolerate buspirone .  She is Tolerating lexapro at 20 mg daily . Using #45 alprazolam per month: 1 mg at bedtime, some days takes one during the day prn . The risks and benefits of benzodiazepine use were reviewed with patient today including excessive sedation leading to respiratory depression,  impaired thinking/driving, and addiction.  Patient was advised to avoid concurrent use with alcohol, to use medication only as needed and not to share with others  .

## 2022-12-08 NOTE — Assessment & Plan Note (Addendum)
Follow up with Dr Jean Rosenthal for surveillance  .  She underwent TAH/BS (robotic) un 2024 Jean Rosenthal).

## 2022-12-08 NOTE — Assessment & Plan Note (Signed)
Resolved s/p TAH/BSO June 2024

## 2022-12-08 NOTE — Assessment & Plan Note (Addendum)
Managed with tramadol .and tizanidine.   Refill history confirmed via [place] Controlled Substance databas, accessed by me today.Tammy Christian

## 2022-12-08 NOTE — Assessment & Plan Note (Signed)
Pain management has improved with weight loss and absence from work which  aggravated pain during procedural use of  lead apron .  She is using 2  tramadol daily every 8 hours as needed along with tizanidine .

## 2022-12-08 NOTE — Assessment & Plan Note (Signed)
Secondary to menometrorrhagia.  Resolved s/p TAH/BSO Lab Results  Component Value Date   WBC 10.5 09/08/2022   HGB 12.2 09/08/2022   HCT 37.9 09/08/2022   MCV 80.6 09/08/2022   PLT 381 09/08/2022   Lab Results  Component Value Date   IRON 94 02/20/2022   TIBC 367 02/20/2022   FERRITIN 9 (L) 02/20/2022

## 2022-12-08 NOTE — Assessment & Plan Note (Signed)
Improved with Break from work and with resolution of GYN issues.  Prefers to continue  use of  lexapro . No changes today

## 2022-12-19 ENCOUNTER — Telehealth: Payer: Self-pay | Admitting: Internal Medicine

## 2022-12-23 ENCOUNTER — Encounter: Payer: Self-pay | Admitting: Internal Medicine

## 2022-12-25 ENCOUNTER — Encounter: Payer: Self-pay | Admitting: Internal Medicine

## 2022-12-26 ENCOUNTER — Telehealth: Payer: Self-pay | Admitting: Internal Medicine

## 2022-12-29 ENCOUNTER — Other Ambulatory Visit: Payer: Self-pay | Admitting: Internal Medicine

## 2022-12-29 DIAGNOSIS — R109 Unspecified abdominal pain: Secondary | ICD-10-CM

## 2023-01-01 MED ORDER — TIRZEPATIDE 2.5 MG/0.5ML ~~LOC~~ SOAJ
2.5000 mg | SUBCUTANEOUS | 2 refills | Status: DC
Start: 2023-01-01 — End: 2023-02-28

## 2023-01-01 NOTE — Assessment & Plan Note (Signed)
She has gained weight since she had to discontinue  use of Wegovy.  She has requested compounded Zepbound from [facility]

## 2023-02-13 ENCOUNTER — Other Ambulatory Visit: Payer: Self-pay | Admitting: Internal Medicine

## 2023-02-14 ENCOUNTER — Encounter: Payer: Self-pay | Admitting: Internal Medicine

## 2023-02-21 ENCOUNTER — Telehealth: Payer: Self-pay

## 2023-02-21 NOTE — Telephone Encounter (Signed)
Is a video appt okay?

## 2023-02-21 NOTE — Telephone Encounter (Signed)
See my chart message

## 2023-02-21 NOTE — Telephone Encounter (Signed)
Patient scheduled her 73-month follow-up via MyChart as a virtual visit.  Please let us know if this is ok.

## 2023-02-28 ENCOUNTER — Encounter: Payer: Self-pay | Admitting: Internal Medicine

## 2023-02-28 ENCOUNTER — Telehealth (INDEPENDENT_AMBULATORY_CARE_PROVIDER_SITE_OTHER): Payer: Self-pay | Admitting: Internal Medicine

## 2023-02-28 VITALS — Ht 67.0 in | Wt 200.0 lb

## 2023-02-28 DIAGNOSIS — R7401 Elevation of levels of liver transaminase levels: Secondary | ICD-10-CM

## 2023-02-28 DIAGNOSIS — E66813 Obesity, class 3: Secondary | ICD-10-CM

## 2023-02-28 DIAGNOSIS — Z6841 Body Mass Index (BMI) 40.0 and over, adult: Secondary | ICD-10-CM

## 2023-02-28 DIAGNOSIS — M5387 Other specified dorsopathies, lumbosacral region: Secondary | ICD-10-CM

## 2023-02-28 DIAGNOSIS — D500 Iron deficiency anemia secondary to blood loss (chronic): Secondary | ICD-10-CM

## 2023-02-28 DIAGNOSIS — F411 Generalized anxiety disorder: Secondary | ICD-10-CM

## 2023-02-28 MED ORDER — ALPRAZOLAM 1 MG PO TABS: 1.5000 mg | ORAL_TABLET | Freq: Every evening | ORAL | 0 refills | Status: DC | PRN

## 2023-02-28 MED ORDER — TIRZEPATIDE 5 MG/0.5ML ~~LOC~~ SOAJ: 5.0000 mg | SUBCUTANEOUS | 2 refills | Status: DC

## 2023-02-28 MED ORDER — TRAMADOL HCL 50 MG PO TABS: 100.0000 mg | ORAL_TABLET | Freq: Three times a day (TID) | ORAL | 0 refills | Status: DC | PRN

## 2023-02-28 NOTE — Assessment & Plan Note (Signed)
She gained weight without use of GLP 1 agnoist.   She has requested continued use of compounded Zepbound from [facility] .  5 mg dose prescribed and sent

## 2023-02-28 NOTE — Assessment & Plan Note (Signed)
Pain management has improved with weight loss and absence from work which  aggravated pain during procedural use of  lead apron .  She is using 5 tramadol daily along with tizanidine .Refill history confirmed via [place] Controlled Substance databas, accessed by me today.. last refill was Nov 19

## 2023-02-28 NOTE — Progress Notes (Signed)
Virtual Visit via Caregility   Note   This format is felt to be most appropriate for this patient at this time.  All issues noted in this document were discussed and addressed.  No physical exam was performed (except for noted visual exam findings with Video Visits).   I connected with Tammy Christian  on 02/28/23 at 11:00 AM EST by a video enabled telemedicine application  and verified that I am speaking with the correct person using two identifiers. Location patient: home Location provider: work or home office Persons participating in the virtual visit: patient, provider  I discussed the limitations, risks, security and privacy concerns of performing an evaluation and management service by telephone and the availability of in person appointments. I also discussed with the patient that there may be a patient responsible charge related to this service. The patient expressed understanding and agreed to proceed.   Reason for visit: medication refill   HPI:   Patient has chronic sciatica managed with tramadol and tizanidine.  Hse has not requested early refills   GAD managed with alprazolam .  SHE is using 1.5 tablets daily in divided doses.  No early refill requests   Obesity:  using compounded Mounjaro at the 2.5 mg weekly dose  filled at [facility] .  Tolerating medication without abdominal pain .  requesting higher dose.    ROS: See pertinent positives and negatives per HPI.  Past Medical History:  Diagnosis Date   Abnormal Pap smear of cervix 2012   Abortion 2001   Allergy    Anxiety    panic disorder   Arthritis    due to spinal fusion in lumbar region   Asthma    very mild, mostly humid weather   Elevated liver enzymes 2023   Gastritis 2005   History of kidney stones    Hyperlipidemia    Follows with Dr.Latonya Knight, PCP.   Migraines    Follows w/ PCP, Dr. Julienne Kass.   Obesity    Panic disorder    Follows w/ PCP, Dr. Julienne Kass.   Vitamin D deficiency disease     Past Surgical History:   Procedure Laterality Date   BREAST BIOPSY Right 02/04/2001   Negative c Clip   CHOLECYSTECTOMY  04/08/2012   COLONOSCOPY WITH PROPOFOL N/A 07/12/2017   Procedure: COLONOSCOPY WITH PROPOFOL;  Surgeon: Toney Reil, MD;  Location: [facility] ENDOSCOPY;  Service: Gastroenterology;  Laterality: N/A;   COLPOSCOPY  2012   ESOPHAGOGASTRODUODENOSCOPY (EGD) WITH PROPOFOL N/A 07/12/2017   Procedure: ESOPHAGOGASTRODUODENOSCOPY (EGD) WITH PROPOFOL;  Surgeon: Toney Reil, MD;  Location: [facility] ENDOSCOPY;  Service: Gastroenterology;  Laterality: N/A;   KNEE ARTHROSCOPY Left 05/01/2016   Procedure: ARTHROSCOPY KNEE LATERAL RELEASE;  Surgeon: Kennedy Bucker, MD;  Location: [facility] ORS;  Service: Orthopedics;  Laterality: Left;   laprascopic sleeve  04/08/2012   GASTRIC   ROBOTIC ASSISTED LAPAROSCOPIC HYSTERECTOMY AND SALPINGECTOMY Bilateral 09/12/2022   Procedure: XI ROBOTIC ASSISTED LAPAROSCOPIC HYSTERECTOMY AND SALPINGECTOMY;  Surgeon: Olivia Mackie, MD;  Location: [facility] [place];  Service: Gynecology;  Laterality: Bilateral;  Requests 2 1/2 hrs.   SPINAL FUSION  09/25/2016    Family History  Problem Relation Age of Onset   Cancer Maternal Grandmother        LUNG, THROAT   Macular degeneration Maternal Grandmother    Heart disease Paternal Grandmother    Alzheimer's disease Paternal Grandmother    Stroke Paternal Grandmother    Cancer Paternal Grandfather        COLON  Diabetes Father    Heart disease Father        atrial fib   Hyperlipidemia Father    Hypertension Father    Cancer Father 44       colon polyps   Colon polyps Father     SOCIAL HX:  reports that she has never smoked. She has never used smokeless tobacco. She reports current alcohol use. She reports that she does not use drugs.    Current Outpatient Medications:    albuterol (VENTOLIN HFA) 108 (90 Base) MCG/ACT inhaler, Inhale 2 puffs into the lungs every 4 (four) hours as needed for wheezing or shortness of  breath., Disp: 6.7 g, Rfl: 5   Clindamycin-Benzoyl Per, Refr, gel, Apply to face 2 times daily as needed, Disp: 45 g, Rfl: 5   cyanocobalamin (VITAMIN B12) 1000 MCG/ML injection, Inject 1 mL (1,000 mcg total) into the muscle every 30 (thirty) days., Disp: 10 mL, Rfl: 0   desloratadine (CLARINEX) 5 MG tablet, TAKE 1 TABLET (5 MG TOTAL) BY MOUTH DAILY. (Patient taking differently: Take by mouth as needed.), Disp: 90 tablet, Rfl: 3   dicyclomine (BENTYL) 10 MG capsule, TAKE 1 CAPSULE (10 MG TOTAL) BY MOUTH 4 TIMES A DAY BEFORE MEALS AND AT BEDTIME, Disp: 360 capsule, Rfl: 0   metoprolol succinate (TOPROL-XL) 100 MG 24 hr tablet, TAKE 1 TABLET BY MOUTH DAILY. TAKE WITH OR IMMEDIATELY FOLLOWING A MEAL., Disp: 90 tablet, Rfl: 3   montelukast (SINGULAIR) 10 MG tablet, Take 1 tablet (10 mg total) by mouth at bedtime. (Patient taking differently: Take 10 mg by mouth as needed.), Disp: 90 tablet, Rfl: 3   Multiple Vitamin (MULTIVITAMIN WITH MINERALS) TABS tablet, Take 1 tablet by mouth daily., Disp: , Rfl:    omeprazole (PRILOSEC) 40 MG capsule, Take 1 capsule (40 mg total) by mouth 2 (two) times daily before a meal. (Patient taking differently: Take 40 mg by mouth daily.), Disp: 180 capsule, Rfl: 3   ondansetron (ZOFRAN) 8 MG tablet, TAKE 1 TABLET BY MOUTH EVERY 8 HOURS AS NEEDED (Patient taking differently: as needed.), Disp: 90 tablet, Rfl: 4   SUMAtriptan (IMITREX) 100 MG tablet, Take 1 tablet (100 mg total) by mouth daily as needed for migraine., Disp: 9 tablet, Rfl: 11   tirzepatide (MOUNJARO) 5 MG/0.5ML Pen, Inject 5 mg into the skin once a week., Disp: 6 mL, Rfl: 2   tiZANidine (ZANAFLEX) 4 MG tablet, Take 1 tablet (4 mg total) by mouth every 6 (six) hours as needed for spasm., Disp: 60 tablet, Rfl: 5   Vitamin D, Ergocalciferol, (DRISDOL) 1.25 MG (50000 UNIT) CAPS capsule, Take 1 capsule (50,000 Units total) by mouth every 7 (seven) days., Disp: 12 capsule, Rfl: 1   ALPRAZolam (XANAX) 1 MG tablet, Take  1.5 tablets (1.5 mg total) by mouth at bedtime as needed for anxiety., Disp: 45 tablet, Rfl: 0   traMADol (ULTRAM) 50 MG tablet, Take 2 tablets (100 mg total) by mouth every 8 (eight) hours as needed., Disp: 150 tablet, Rfl: 0  EXAM:  VITALS per patient if applicable:  GENERAL: alert, oriented, appears well and in no acute distress  HEENT: atraumatic, conjunttiva clear, no obvious abnormalities on inspection of external nose and ears  NECK: normal movements of the head and neck  LUNGS: on inspection no signs of respiratory distress, breathing rate appears normal, no obvious gross SOB, gasping or wheezing  CV: no obvious cyanosis  MS: moves all visible extremities without noticeable abnormality  PSYCH/NEURO: pleasant and  cooperative, no obvious depression or anxiety, speech and thought processing grossly intact  ASSESSMENT AND PLAN: Sciatica of left side associated with disorder of lumbosacral spine Assessment & Plan: Pain management has improved with weight loss and absence from work which  aggravated pain during procedural use of  lead apron .  She is using 5 tramadol daily along with tizanidine .Refill history confirmed via [place] Controlled Substance databas, accessed by me today.. last refill was Nov 19    Generalized anxiety disorder Assessment & Plan:  Reviewed prior therapies :  she Did not tolerate buspirone .  She is Tolerating lexapro at 20 mg daily . Using #45 alprazolam per month: 1 mg at bedtime, some days takes one during the day prn . The risks and benefits of benzodiazepine use were reviewed with patient today including excessive sedation leading to respiratory depression,  impaired thinking/driving, and addiction.  Patient was advised to avoid concurrent use with alcohol, to use medication only as needed and not to share with others  . Refill history confirmed via [place] Controlled Substance databas, accessed by me today.. last refill was nov 26     Class 3 severe obesity due  to excess calories without serious comorbidity with body mass index (BMI) of 40.0 to 44.9 in adult [facility]) Assessment & Plan: She gained weight without use of GLP 1 agnoist.   She has requested continued use of compounded Zepbound from [facility] .  5 mg dose prescribed and sent    Iron deficiency anemia due to chronic blood loss -     CBC with Differential/Platelet; Future  Elevated ALT measurement -     Comprehensive metabolic panel; Future  Other orders -     ALPRAZolam; Take 1.5 tablets (1.5 mg total) by mouth at bedtime as needed for anxiety.  Dispense: 45 tablet; Refill: 0 -     traMADol HCl; Take 2 tablets (100 mg total) by mouth every 8 (eight) hours as needed.  Dispense: 150 tablet; Refill: 0 -     Tirzepatide; Inject 5 mg into the skin once a week.  Dispense: 6 mL; Refill: 2      I discussed the assessment and treatment plan with the patient. The patient was provided an opportunity to ask questions and all were answered. The patient agreed with the plan and demonstrated an understanding of the instructions.   The patient was advised to call back or seek an in-person evaluation if the symptoms worsen or if the condition fails to improve as anticipated.   I spent 30 minutes dedicated to the care of this patient on the date of this encounter to include pre-visit review of his medical history,  Face-to-face time with the patient , and post visit ordering of testing and therapeutics.    Sherlene Shams, MD

## 2023-02-28 NOTE — Assessment & Plan Note (Signed)
Reviewed prior therapies :  she Did not tolerate buspirone .  She is Tolerating lexapro at 20 mg daily . Using #45 alprazolam per month: 1 mg at bedtime, some days takes one during the day prn . The risks and benefits of benzodiazepine use were reviewed with patient today including excessive sedation leading to respiratory depression,  impaired thinking/driving, and addiction.  Patient was advised to avoid concurrent use with alcohol, to use medication only as needed and not to share with others  . Refill history confirmed via [place] Controlled Substance databas, accessed by me today.. last refill was nov 26

## 2023-03-28 ENCOUNTER — Telehealth: Payer: Self-pay | Admitting: Internal Medicine

## 2023-05-08 ENCOUNTER — Other Ambulatory Visit: Payer: Self-pay | Admitting: Internal Medicine

## 2023-05-14 ENCOUNTER — Encounter: Payer: Self-pay | Admitting: Internal Medicine

## 2023-05-14 MED ORDER — OMEPRAZOLE 40 MG PO CPDR: 40.0000 mg | DELAYED_RELEASE_CAPSULE | Freq: Two times a day (BID) | ORAL | 3 refills | Status: AC

## 2023-05-27 ENCOUNTER — Encounter: Payer: Self-pay | Admitting: Internal Medicine

## 2023-05-29 MED ORDER — TIRZEPATIDE-WEIGHT MANAGEMENT 5 MG/0.5ML ~~LOC~~ SOLN: 7.5000 mg | SUBCUTANEOUS | 1 refills | Status: DC

## 2023-06-05 ENCOUNTER — Other Ambulatory Visit: Payer: Self-pay | Admitting: Internal Medicine

## 2023-06-05 ENCOUNTER — Encounter: Payer: Self-pay | Admitting: Internal Medicine

## 2023-06-05 NOTE — Telephone Encounter (Signed)
 Alprazolam refilled: 02/28/2023 Tramadol refilled: 02/28/2023  Last OV: 02/28/2023 Next OV: 06/27/2023

## 2023-06-20 ENCOUNTER — Encounter: Payer: Self-pay | Admitting: Internal Medicine

## 2023-06-20 ENCOUNTER — Telehealth: Payer: Self-pay

## 2023-06-20 NOTE — Telephone Encounter (Signed)
 I left a voicemail for patient asking her to please call us back to reschedule her 08/24/2023 appointment with Dr. Duncan Dull, as provider will not be in the office that day.  I also sent a letter to patient via MyChart.  E2C2 - when patient calls back, please reschedule this appointment.

## 2023-06-27 ENCOUNTER — Telehealth: Payer: Self-pay | Admitting: Internal Medicine

## 2023-08-22 ENCOUNTER — Encounter: Payer: Self-pay | Admitting: Internal Medicine

## 2023-08-22 ENCOUNTER — Telehealth (INDEPENDENT_AMBULATORY_CARE_PROVIDER_SITE_OTHER): Payer: Self-pay | Admitting: Internal Medicine

## 2023-08-22 ENCOUNTER — Other Ambulatory Visit: Payer: Self-pay | Admitting: Internal Medicine

## 2023-08-22 VITALS — Ht 67.0 in | Wt 190.0 lb

## 2023-08-22 DIAGNOSIS — E538 Deficiency of other specified B group vitamins: Secondary | ICD-10-CM

## 2023-08-22 DIAGNOSIS — E66813 Obesity, class 3: Secondary | ICD-10-CM

## 2023-08-22 DIAGNOSIS — R7301 Impaired fasting glucose: Secondary | ICD-10-CM

## 2023-08-22 DIAGNOSIS — M4722 Other spondylosis with radiculopathy, cervical region: Secondary | ICD-10-CM

## 2023-08-22 DIAGNOSIS — F411 Generalized anxiety disorder: Secondary | ICD-10-CM

## 2023-08-22 DIAGNOSIS — D500 Iron deficiency anemia secondary to blood loss (chronic): Secondary | ICD-10-CM

## 2023-08-22 DIAGNOSIS — N200 Calculus of kidney: Secondary | ICD-10-CM

## 2023-08-22 DIAGNOSIS — M545 Low back pain, unspecified: Secondary | ICD-10-CM

## 2023-08-22 DIAGNOSIS — Z6841 Body Mass Index (BMI) 40.0 and over, adult: Secondary | ICD-10-CM

## 2023-08-22 DIAGNOSIS — J302 Other seasonal allergic rhinitis: Secondary | ICD-10-CM

## 2023-08-22 DIAGNOSIS — E559 Vitamin D deficiency, unspecified: Secondary | ICD-10-CM

## 2023-08-22 DIAGNOSIS — E782 Mixed hyperlipidemia: Secondary | ICD-10-CM

## 2023-08-22 MED ORDER — TRAMADOL HCL 50 MG PO TABS: 100.0000 mg | ORAL_TABLET | Freq: Three times a day (TID) | ORAL | 2 refills | Status: DC | PRN

## 2023-08-22 MED ORDER — ALPRAZOLAM 1 MG PO TABS: 1.5000 mg | ORAL_TABLET | Freq: Every evening | ORAL | 2 refills | Status: DC | PRN

## 2023-08-22 NOTE — Assessment & Plan Note (Signed)
 Reviewed prior therapies :  she Did not tolerate buspirone  .  She is Tolerating lexapro  at 20 mg daily . Using #45 alprazolam  per month: 1 mg at bedtime, some days takes one during the day prn . The risks and benefits of benzodiazepine use were reviewed with patient today including excessive sedation leading to respiratory depression,  impaired thinking/driving, and addiction.  Patient was advised to avoid concurrent use with alcohol, to use medication only as needed and not to share with others  . Refill history confirmed via [place] Controlled Substance databas, accessed by me today.. last refill May 12

## 2023-08-22 NOTE — Assessment & Plan Note (Signed)
 Pain management has improved with weight loss but is aggravated by physical activity .  She is  using 2  tramadol  daily every 12 hours as needed along with tizanidine   Recent rx for  vicodin  from dentist reviewed with patient,  warned that  concurrent use of alprazolam   and  tramadol  is unsafe.

## 2023-08-22 NOTE — Assessment & Plan Note (Signed)
Managed with tramadol .and tizanidine.   Refill history confirmed via [place] Controlled Substance databas, accessed by me today.Marland Kitchen

## 2023-08-22 NOTE — Assessment & Plan Note (Signed)
 Managed with monthly injections.  Has not had level checked in 18 months.   Lab Results  Component Value Date   VITAMINB12 353 02/20/2022

## 2023-08-22 NOTE — Assessment & Plan Note (Addendum)
 Complicated by chronic  low back pain Current weight is reperted at 190 lbs.  She gains weight without use of GLP 1 agnoist.   She is using  compounded Zepbound   intermittently to manage weight

## 2023-08-22 NOTE — Progress Notes (Signed)
 Virtual Visit via Caregility   Note   This format is felt to be most appropriate for this patient at this time.  All issues noted in this document were discussed and addressed.  No physical exam was performed (except for noted visual exam findings with Video Visits).   I connected with Tammy Christian  on 08/22/23 at 10:00 AM EDT by a video enabled telemedicine application or telephone and verified that I am speaking with the correct person using two identifiers. Location patient: home Location provider: work or home office Persons participating in the virtual visit: patient, provider  I discussed the limitations, risks, security and privacy concerns of performing an evaluation and management service by telephone and the availability of in person appointments. I also discussed with the patient that there may be a patient responsible charge related to this service. The patient expressed understanding and agreed to proceed.   Reason for visit: medication refills  HPI:  43 yr old female with obesity,  chronic low back pain, GAD last seen in December presents for follow up .Tammy Christian   Recently had dental work,  tooth extractions,  had 2 rx's for vicodin filled (prescribed by her dentist on May 19 and May 23   .  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  Has not been exercising to strengthen lower back .  Still deconditioned   Obesity: using compounded Zepbound   7.5 mg dose  intermittently.  Weight 190 and stable.   ROS: See pertinent positives and negatives per HPI.  Past Medical History:  Diagnosis Date   Abnormal Pap smear of cervix 2012   Abortion 2001   Allergy    Anxiety    panic disorder   Arthritis    due to spinal fusion in lumbar region   Asthma    very mild, mostly humid weather   Elevated liver enzymes 2023   Gastritis 2005   History of kidney stones    Hyperlipidemia     Follows with Dr.Seila Liston, PCP.   Migraines    Follows w/ PCP, Dr. Dorothe Gaster.   Obesity    Panic disorder    Follows w/ PCP, Dr. Dorothe Gaster.   Vitamin D  deficiency disease     Past Surgical History:  Procedure Laterality Date   BREAST BIOPSY Right 02/04/2001   Negative c Clip   CHOLECYSTECTOMY  04/08/2012   COLONOSCOPY WITH PROPOFOL  N/A 07/12/2017   Procedure: COLONOSCOPY WITH PROPOFOL ;  Surgeon: Selena Daily, MD;  Location[facility] ENDOSCOPY;  Service: Gastroenterology;  Laterality: N/A;   COLPOSCOPY  2012   ESOPHAGOGASTRODUODENOSCOPY (EGD) WITH PROPOFOL  N/A 07/12/2017   Procedure: ESOPHAGOGASTRODUODENOSCOPY (EGD) WITH PROPOFOL ;  Surgeon: Selena Daily, MD;  Location: [facility] ENDOSCOPY;  Service: Gastroenterology;  Laterality: N/A;   KNEE ARTHROSCOPY Left 05/01/2016   Procedure: ARTHROSCOPY KNEE LATERAL RELEASE;  Surgeon: Molli Angelucci, MD;  Location: [facility] ORS;  Service: Orthopedics;  Laterality: Left;   laprascopic sleeve  04/08/2012   GASTRIC   ROBOTIC ASSISTED LAPAROSCOPIC HYSTERECTOMY AND SALPINGECTOMY Bilateral 09/12/2022   Procedure: XI ROBOTIC ASSISTED LAPAROSCOPIC HYSTERECTOMY AND SALPINGECTOMY;  Surgeon: Meriam Stamp, MD;  Location: [facility] [place];  Service: Gynecology;  Laterality: Bilateral;  Requests 2 1/2 hrs.   SPINAL FUSION  09/25/2016    Family History  Problem Relation Age of Onset   Cancer Maternal Grandmother        LUNG, THROAT  Macular degeneration Maternal Grandmother    Heart disease Paternal Grandmother    Alzheimer's disease Paternal Grandmother    Stroke Paternal Grandmother    Cancer Paternal Grandfather        COLON   Diabetes Father    Heart disease Father        atrial fib   Hyperlipidemia Father    Hypertension Father    Cancer Father 80       colon polyps   Colon polyps Father     SOCIAL HX:  reports that she has never smoked. She has never used smokeless tobacco. She reports current alcohol use. She reports that she does not use  drugs.    Current Outpatient Medications:    albuterol  (VENTOLIN  HFA) 108 (90 Base) MCG/ACT inhaler, Inhale 2 puffs into the lungs every 4 (four) hours as needed for wheezing or shortness of breath., Disp: 6.7 g, Rfl: 5   ALPRAZolam  (XANAX ) 1 MG tablet, TAKE 1.5 TABLETS (1.5 MG TOTAL) BY MOUTH AT BEDTIME AS NEEDED FOR ANXIETY., Disp: 45 tablet, Rfl: 2   Clindamycin -Benzoyl Per, Refr, gel, Apply to face 2 times daily as needed, Disp: 45 g, Rfl: 5   cyanocobalamin  (VITAMIN B12) 1000 MCG/ML injection, Inject 1 mL (1,000 mcg total) into the muscle every 30 (thirty) days., Disp: 10 mL, Rfl: 0   desloratadine  (CLARINEX ) 5 MG tablet, TAKE 1 TABLET (5 MG TOTAL) BY MOUTH DAILY. (Patient taking differently: Take by mouth as needed.), Disp: 90 tablet, Rfl: 3   metoprolol  succinate (TOPROL -XL) 100 MG 24 hr tablet, TAKE 1 TABLET BY MOUTH DAILY. TAKE WITH OR IMMEDIATELY FOLLOWING A MEAL., Disp: 90 tablet, Rfl: 3   montelukast  (SINGULAIR ) 10 MG tablet, Take 1 tablet (10 mg total) by mouth at bedtime. (Patient taking differently: Take 10 mg by mouth as needed.), Disp: 90 tablet, Rfl: 3   Multiple Vitamin (MULTIVITAMIN WITH MINERALS) TABS tablet, Take 1 tablet by mouth daily., Disp: , Rfl:    omeprazole  (PRILOSEC) 40 MG capsule, Take 1 capsule (40 mg total) by mouth 2 (two) times daily before a meal., Disp: 180 capsule, Rfl: 3   SUMAtriptan  (IMITREX ) 100 MG tablet, Take 1 tablet (100 mg total) by mouth daily as needed for migraine., Disp: 9 tablet, Rfl: 11   tirzepatide  5 MG/0.5ML injection vial, Inject 7.5 mg into the skin once a week., Disp: 9 mL, Rfl: 1   tiZANidine  (ZANAFLEX ) 4 MG tablet, TAKE 1 TABLET BY MOUTH EVERY 6 HOURS AS NEEDED FOR SPASM, Disp: 270 tablet, Rfl: 1   traMADol  (ULTRAM ) 50 MG tablet, Take 2 tablets (100 mg total) by mouth every 8 (eight) hours as needed., Disp: 150 tablet, Rfl: 2   ondansetron  (ZOFRAN ) 8 MG tablet, TAKE 1 TABLET BY MOUTH EVERY 8 HOURS AS NEEDED (Patient not taking: Reported  on 08/22/2023), Disp: 90 tablet, Rfl: 4  EXAM:  VITALS per patient if applicable:  GENERAL: alert, oriented, appears well and in no acute distress  HEENT: atraumatic, conjunttiva clear, no obvious abnormalities on inspection of external nose and ears  NECK: normal movements of the head and neck  LUNGS: on inspection no signs of respiratory distress, breathing rate appears normal, no obvious gross SOB, gasping or wheezing  CV: no obvious cyanosis  MS: moves all visible extremities without noticeable abnormality  PSYCH/NEURO: pleasant and cooperative, no obvious depression or anxiety, speech and thought processing grossly intact  ASSESSMENT AND PLAN: B12 deficiency Assessment & Plan: Managed with monthly injections.  Has not had level  checked in 18 months.   Lab Results  Component Value Date   VITAMINB12 353 02/20/2022      Cervical radiculopathy due to degenerative joint disease of spine Assessment & Plan: Managed with tramadol  .and tizanidine .   Refill history confirmed via [place] Controlled Substance databas, accessed by me today..    Generalized anxiety disorder Assessment & Plan:  Reviewed prior therapies :  she Did not tolerate buspirone  .  She is Tolerating lexapro  at 20 mg daily . Using #45 alprazolam  per month: 1 mg at bedtime, some days takes one during the day prn . The risks and benefits of benzodiazepine use were reviewed with patient today including excessive sedation leading to respiratory depression,  impaired thinking/driving, and addiction.  Patient was advised to avoid concurrent use with alcohol, to use medication only as needed and not to share with others  . Refill history confirmed via [place] Controlled Substance databas, accessed by me today.. last refill May 12    Iron deficiency anemia due to chronic blood loss Assessment & Plan: Secondary to menometrorrhagia and prior bariatric surger,y.  Menorrhagia has resolved s/p TAH/BSO but has not had iron checked in  one year  Lab Results  Component Value Date   WBC 10.5 09/08/2022   HGB 12.2 09/08/2022   HCT 37.9 09/08/2022   MCV 80.6 09/08/2022   PLT 381 09/08/2022   Lab Results  Component Value Date   IRON 94 02/20/2022   TIBC 367 02/20/2022   FERRITIN 9 (L) 02/20/2022     Low back pain potentially associated with radiculopathy Assessment & Plan: Pain management has improved with weight loss but is aggravated by physical activity .  She is  using 2  tramadol  daily every 12 hours as needed along with tizanidine   Recent rx for  vicodin  from dentist reviewed with patient,  warned that  concurrent use of alprazolam   and  tramadol  is unsafe.    Class 3 severe obesity due to excess calories with serious comorbidity and body mass index (BMI) of 40.0 to 44.9 in adult Assessment & Plan: Complicated by chronic  low back pain Current weight is reperted at 190 lbs.  She gains weight without use of GLP 1 agnoist.   She is using  compounded Zepbound   intermittently to manage weight        I discussed the assessment and treatment plan with the patient. The patient was provided an opportunity to ask questions and all were answered. The patient agreed with the plan and demonstrated an understanding of the instructions.   The patient was advised to call back or seek an in-person evaluation if the symptoms worsen or if the condition fails to improve as anticipated.   I spent 30 minutes dedicated to the care of this patient on the date of this encounter to include pre-visit review of patient's medical history,  including recent dental procedures,  revuew of [place] controlled substance database , prior  imaging studies and labs, counselling on pain management , face-to-face time with the patient , and post visit ordering of testing and therapeutics.    Thersia Flax, MD

## 2023-08-22 NOTE — Assessment & Plan Note (Signed)
 Secondary to menometrorrhagia and prior bariatric surger,y.  Menorrhagia has resolved s/p TAH/BSO but has not had iron checked in one year  Lab Results  Component Value Date   WBC 10.5 09/08/2022   HGB 12.2 09/08/2022   HCT 37.9 09/08/2022   MCV 80.6 09/08/2022   PLT 381 09/08/2022   Lab Results  Component Value Date   IRON 94 02/20/2022   TIBC 367 02/20/2022   FERRITIN 9 (L) 02/20/2022

## 2023-08-23 ENCOUNTER — Other Ambulatory Visit: Payer: Self-pay | Admitting: Internal Medicine

## 2023-08-23 NOTE — Telephone Encounter (Signed)
 Called and Left VM for patient to call and schedule appt needed before prescription can be refilled.

## 2023-08-24 ENCOUNTER — Telehealth: Payer: Self-pay | Admitting: Internal Medicine

## 2023-08-28 ENCOUNTER — Other Ambulatory Visit: Payer: Self-pay | Admitting: Internal Medicine

## 2023-09-17 ENCOUNTER — Telehealth: Payer: Self-pay

## 2023-09-17 NOTE — Telephone Encounter (Signed)
 Pharmacy is aware of Dr. Lula message.

## 2023-09-17 NOTE — Telephone Encounter (Signed)
 Received a fax from pharmacy stating that there is an interaction between pt's medication Tramadol  and Alprazolam . They need to make sure you are aware and you believe the benefits out weigh the risks.

## 2023-09-30 ENCOUNTER — Other Ambulatory Visit: Payer: Self-pay | Admitting: Internal Medicine

## 2023-09-30 ENCOUNTER — Encounter: Payer: Self-pay | Admitting: Internal Medicine

## 2023-10-01 ENCOUNTER — Other Ambulatory Visit: Payer: Self-pay | Admitting: Medical Genetics

## 2023-10-02 MED ORDER — ALPRAZOLAM 1 MG PO TABS: 1.5000 mg | ORAL_TABLET | Freq: Every evening | ORAL | 2 refills | Status: DC | PRN

## 2023-10-02 MED ORDER — TRAMADOL HCL 50 MG PO TABS
100.0000 mg | ORAL_TABLET | Freq: Three times a day (TID) | ORAL | 2 refills | Status: DC | PRN
Start: 2023-10-02 — End: 2024-01-03

## 2023-10-23 ENCOUNTER — Other Ambulatory Visit: Payer: Self-pay | Admitting: Internal Medicine

## 2023-11-20 ENCOUNTER — Other Ambulatory Visit

## 2023-11-21 ENCOUNTER — Encounter: Payer: Self-pay | Admitting: Internal Medicine

## 2023-11-23 ENCOUNTER — Other Ambulatory Visit: Payer: Self-pay | Admitting: Internal Medicine

## 2023-11-23 DIAGNOSIS — Z1231 Encounter for screening mammogram for malignant neoplasm of breast: Secondary | ICD-10-CM

## 2023-11-30 ENCOUNTER — Encounter

## 2023-11-30 DIAGNOSIS — Z1231 Encounter for screening mammogram for malignant neoplasm of breast: Secondary | ICD-10-CM

## 2023-12-04 ENCOUNTER — Other Ambulatory Visit

## 2023-12-13 ENCOUNTER — Ambulatory Visit

## 2023-12-24 ENCOUNTER — Other Ambulatory Visit: Payer: Self-pay | Admitting: Medical Genetics

## 2023-12-24 ENCOUNTER — Encounter: Payer: Self-pay | Admitting: Internal Medicine

## 2023-12-24 DIAGNOSIS — Z006 Encounter for examination for normal comparison and control in clinical research program: Secondary | ICD-10-CM

## 2023-12-24 DIAGNOSIS — Z1283 Encounter for screening for malignant neoplasm of skin: Secondary | ICD-10-CM

## 2023-12-24 NOTE — Telephone Encounter (Signed)
 Pt requesting referral. Referral pended

## 2023-12-25 ENCOUNTER — Other Ambulatory Visit: Payer: Self-pay | Admitting: Internal Medicine

## 2023-12-25 DIAGNOSIS — Z1231 Encounter for screening mammogram for malignant neoplasm of breast: Secondary | ICD-10-CM

## 2023-12-26 ENCOUNTER — Encounter

## 2023-12-26 DIAGNOSIS — Z1231 Encounter for screening mammogram for malignant neoplasm of breast: Secondary | ICD-10-CM

## 2023-12-28 ENCOUNTER — Other Ambulatory Visit

## 2023-12-28 DIAGNOSIS — R7301 Impaired fasting glucose: Secondary | ICD-10-CM

## 2023-12-28 DIAGNOSIS — E782 Mixed hyperlipidemia: Secondary | ICD-10-CM

## 2023-12-28 DIAGNOSIS — N200 Calculus of kidney: Secondary | ICD-10-CM | POA: Diagnosis not present

## 2023-12-28 DIAGNOSIS — E559 Vitamin D deficiency, unspecified: Secondary | ICD-10-CM | POA: Diagnosis not present

## 2023-12-28 DIAGNOSIS — E538 Deficiency of other specified B group vitamins: Secondary | ICD-10-CM

## 2023-12-28 DIAGNOSIS — D500 Iron deficiency anemia secondary to blood loss (chronic): Secondary | ICD-10-CM | POA: Diagnosis not present

## 2023-12-28 LAB — TSH: TSH: 1.91 u[IU]/mL (ref 0.35–5.50)

## 2023-12-28 LAB — CBC WITH DIFFERENTIAL/PLATELET
Basophils Absolute: 0 K/uL (ref 0.0–0.1)
Basophils Relative: 0.3 % (ref 0.0–3.0)
Eosinophils Absolute: 0.1 K/uL (ref 0.0–0.7)
Eosinophils Relative: 1.5 % (ref 0.0–5.0)
HCT: 37.7 % (ref 36.0–46.0)
Hemoglobin: 12.2 g/dL (ref 12.0–15.0)
Lymphocytes Relative: 47.7 % — ABNORMAL HIGH (ref 12.0–46.0)
Lymphs Abs: 3 K/uL (ref 0.7–4.0)
MCHC: 32.3 g/dL (ref 30.0–36.0)
MCV: 82.1 fl (ref 78.0–100.0)
Monocytes Absolute: 0.6 K/uL (ref 0.1–1.0)
Monocytes Relative: 8.9 % (ref 3.0–12.0)
Neutro Abs: 2.6 K/uL (ref 1.4–7.7)
Neutrophils Relative %: 41.6 % — ABNORMAL LOW (ref 43.0–77.0)
Platelets: 353 K/uL (ref 150.0–400.0)
RBC: 4.59 Mil/uL (ref 3.87–5.11)
RDW: 15.2 % (ref 11.5–15.5)
WBC: 6.3 K/uL (ref 4.0–10.5)

## 2023-12-28 LAB — COMPREHENSIVE METABOLIC PANEL WITH GFR
ALT: 18 U/L (ref 0–35)
AST: 15 U/L (ref 0–37)
Albumin: 3.9 g/dL (ref 3.5–5.2)
Alkaline Phosphatase: 76 U/L (ref 39–117)
BUN: 11 mg/dL (ref 6–23)
CO2: 28 meq/L (ref 19–32)
Calcium: 8.7 mg/dL (ref 8.4–10.5)
Chloride: 103 meq/L (ref 96–112)
Creatinine, Ser: 0.62 mg/dL (ref 0.40–1.20)
GFR: 107.95 mL/min (ref >60.00)
Glucose, Bld: 97 mg/dL (ref 70–99)
Potassium: 3.9 meq/L (ref 3.5–5.1)
Sodium: 139 meq/L (ref 135–145)
Total Bilirubin: 0.4 mg/dL (ref 0.2–1.2)
Total Protein: 6.2 g/dL (ref 6.0–8.3)

## 2023-12-28 LAB — IBC + FERRITIN
Ferritin: 4.8 ng/mL — ABNORMAL LOW (ref 10.0–291.0)
Iron: 56 ug/dL (ref 42–145)
Saturation Ratios: 12 % — ABNORMAL LOW (ref 20.0–50.0)
TIBC: 464.8 ug/dL — ABNORMAL HIGH (ref 250.0–450.0)
Transferrin: 332 mg/dL (ref 212.0–360.0)

## 2023-12-28 LAB — LDL CHOLESTEROL, DIRECT: Direct LDL: 153 mg/dL

## 2023-12-28 LAB — B12 AND FOLATE PANEL
Folate: 5.1 ng/mL — ABNORMAL LOW (ref >5.9)
Vitamin B-12: 216 pg/mL (ref 211–911)

## 2023-12-28 LAB — LIPID PANEL
Cholesterol: 220 mg/dL — ABNORMAL HIGH (ref 0–200)
HDL: 48.2 mg/dL (ref >39.00)
LDL Cholesterol: 149 mg/dL — ABNORMAL HIGH (ref 0–99)
NonHDL: 172.16
Total CHOL/HDL Ratio: 5
Triglycerides: 118 mg/dL (ref 0.0–149.0)
VLDL: 23.6 mg/dL (ref 0.0–40.0)

## 2023-12-28 LAB — HEMOGLOBIN A1C: Hgb A1c MFr Bld: 5.5 % (ref 4.6–6.5)

## 2023-12-28 LAB — VITAMIN D 25 HYDROXY (VIT D DEFICIENCY, FRACTURES): VITD: 34.38 ng/mL (ref 30.00–100.00)

## 2023-12-29 ENCOUNTER — Other Ambulatory Visit

## 2023-12-30 ENCOUNTER — Ambulatory Visit: Payer: Self-pay | Admitting: Internal Medicine

## 2023-12-30 MED ORDER — CYANOCOBALAMIN 1000 MCG/ML IJ SOLN
1000.0000 ug | INTRAMUSCULAR | 0 refills | Status: DC
Start: 2023-12-30 — End: 2024-01-03

## 2023-12-30 MED ORDER — FOLIC ACID 1 MG PO TABS: 1.0000 mg | ORAL_TABLET | Freq: Every day | ORAL | 3 refills | Status: AC

## 2023-12-30 MED ORDER — CYANOCOBALAMIN 1000 MCG/ML IJ SOLN: 1000.0000 ug | INTRAMUSCULAR | 0 refills | Status: DC

## 2023-12-30 NOTE — Assessment & Plan Note (Signed)
 Recurrent. Previously managed with monthly injections.   Lab Results  Component Value Date   VITAMINB12 216 12/28/2023

## 2023-12-30 NOTE — Addendum Note (Signed)
 Addended by: MARYLYNN VERNEITA CROME on: 12/30/2023 12:12 AM   Modules accepted: Orders

## 2023-12-30 NOTE — Assessment & Plan Note (Signed)
 Recurrent.  Supplementation advised,  etiology needs to be deternined  Lab Results  Component Value Date   IRON 56 12/28/2023   TIBC 464.8 (H) 12/28/2023   FERRITIN 4.8 (L) 12/28/2023

## 2024-01-03 ENCOUNTER — Encounter: Payer: Self-pay | Admitting: Internal Medicine

## 2024-01-03 ENCOUNTER — Ambulatory Visit: Admitting: Internal Medicine

## 2024-01-03 VITALS — BP 110/82 | HR 103 | Ht 67.0 in | Wt 181.2 lb

## 2024-01-03 DIAGNOSIS — F331 Major depressive disorder, recurrent, moderate: Secondary | ICD-10-CM

## 2024-01-03 DIAGNOSIS — Z0001 Encounter for general adult medical examination with abnormal findings: Secondary | ICD-10-CM

## 2024-01-03 DIAGNOSIS — F411 Generalized anxiety disorder: Secondary | ICD-10-CM

## 2024-01-03 DIAGNOSIS — Z90710 Acquired absence of both cervix and uterus: Secondary | ICD-10-CM | POA: Diagnosis not present

## 2024-01-03 DIAGNOSIS — Z0000 Encounter for general adult medical examination without abnormal findings: Secondary | ICD-10-CM

## 2024-01-03 DIAGNOSIS — E538 Deficiency of other specified B group vitamins: Secondary | ICD-10-CM

## 2024-01-03 DIAGNOSIS — M4722 Other spondylosis with radiculopathy, cervical region: Secondary | ICD-10-CM

## 2024-01-03 DIAGNOSIS — D509 Iron deficiency anemia, unspecified: Secondary | ICD-10-CM | POA: Diagnosis not present

## 2024-01-03 DIAGNOSIS — Z23 Encounter for immunization: Secondary | ICD-10-CM

## 2024-01-03 DIAGNOSIS — M5387 Other specified dorsopathies, lumbosacral region: Secondary | ICD-10-CM | POA: Diagnosis not present

## 2024-01-03 DIAGNOSIS — M545 Low back pain, unspecified: Secondary | ICD-10-CM | POA: Diagnosis not present

## 2024-01-03 MED ORDER — ALPRAZOLAM 1 MG PO TABS: 1.5000 mg | ORAL_TABLET | Freq: Every evening | ORAL | 2 refills | Status: DC | PRN

## 2024-01-03 MED ORDER — METOPROLOL SUCCINATE ER 100 MG PO TB24: 100.0000 mg | ORAL_TABLET | Freq: Every day | ORAL | 3 refills | Status: AC

## 2024-01-03 MED ORDER — TRAMADOL HCL 50 MG PO TABS: 100.0000 mg | ORAL_TABLET | Freq: Three times a day (TID) | ORAL | 2 refills | Status: DC | PRN

## 2024-01-03 MED ORDER — ALBUTEROL SULFATE HFA 108 (90 BASE) MCG/ACT IN AERS: 2.0000 | INHALATION_SPRAY | RESPIRATORY_TRACT | 5 refills | Status: AC | PRN

## 2024-01-03 MED ORDER — DESLORATADINE 5 MG PO TABS: ORAL_TABLET | Freq: Every day | ORAL | 3 refills | Status: DC

## 2024-01-03 MED ORDER — ERGOCALCIFEROL 1.25 MG (50000 UT) PO CAPS: 50000.0000 [IU] | ORAL_CAPSULE | ORAL | 0 refills | Status: DC

## 2024-01-03 MED ORDER — TIRZEPATIDE-WEIGHT MANAGEMENT 10 MG/0.5ML ~~LOC~~ SOAJ: 10.0000 mg | SUBCUTANEOUS | 2 refills | Status: AC

## 2024-01-03 MED ORDER — TIZANIDINE HCL 4 MG PO TABS: 4.0000 mg | ORAL_TABLET | Freq: Three times a day (TID) | ORAL | 1 refills | Status: DC | PRN

## 2024-01-03 MED ORDER — TRAZODONE HCL 100 MG PO TABS: 100.0000 mg | ORAL_TABLET | Freq: Every day | ORAL | 1 refills | Status: DC

## 2024-01-03 MED ORDER — MONTELUKAST SODIUM 10 MG PO TABS: 10.0000 mg | ORAL_TABLET | Freq: Every day | ORAL | 3 refills | Status: AC

## 2024-01-03 NOTE — Assessment & Plan Note (Addendum)
 Recurrent despite hysterectomy.   Likely due to gastric sleeve surgery ;   EGD and colonoscopy were donein 2019 for same and  other causes were ruled out  resuming  Supplementation advised Lab Results  Component Value Date   IRON 56 12/28/2023   TIBC 464.8 (H) 12/28/2023   FERRITIN 4.8 (L) 12/28/2023   Lab Results  Component Value Date   WBC 6.3 12/28/2023   HGB 12.2 12/28/2023   HCT 37.7 12/28/2023   MCV 82.1 12/28/2023   PLT 353.0 12/28/2023

## 2024-01-03 NOTE — Progress Notes (Signed)
 Patient ID: Tammy Christian, female    DOB: Sep 16, 1980  Age: 43 y.o. MRN: [id number]  The patient is here for annual preventive examination, follow up on chronic conditions including obesity, chronic pain and GAD .   The risk factors are reflected in the social history.   The roster of all physicians providing medical care to patient - is listed in the Snapshot section of the chart.   Activities of daily living:  The patient is 100% independent in all ADLs: dressing, toileting, feeding as well as independent mobility   Home safety : The patient has smoke detectors in the home. They wear seatbelts.  There are no unsecured firearms at home. There is no violence in the home.    There is no risks for hepatitis, STDs or HIV. There is no   history of blood transfusion. They have no travel history to infectious disease endemic areas of the world.   The patient has seen their dentist in the last six month. They have seen their eye doctor in the last year. The patinet  denies slight hearing difficulty with regard to whispered voices and some television programs.  They have deferred audiologic testing in the last year.  They do not  have excessive sun exposure. Discussed the need for sun protection: hats, long sleeves and use of sunscreen if there is significant sun exposure.    Diet: the importance of a healthy diet is discussed. They do have a healthy diet.   The benefits of regular aerobic exercise were discussed. The patient  exercises  3 to 5 days per week  for  60 minutes.    Depression screen: there are no signs or vegative symptoms of depression- irritability, change in appetite, anhedonia, sadness/tearfullness.   The following portions of the patient's history were reviewed and updated as appropriate: allergies, current medications, past family history, past medical history,  past surgical history, past social history  and problem list.   Visual acuity was not assessed per patient  preference since the patient has regular follow up with an  ophthalmologist. Hearing and body mass index were assessed and reviewed.    During the course of the visit the patient was educated and counseled about appropriate screening and preventive services including : fall prevention , diabetes screening, nutrition counseling, colorectal cancer screening, and recommended immunizations.    Chief Complaint:  Recent labs indicating iron, b12 and folic acid  deficiencies , which are chronic /recurrent due to dietary changes .  She  has resumed meds   Mammogram scheduled oct 31 Annual dermatology Has not worked in 3 years due to depression/anxiety resulting in lack of self care/ neglect resulting in tooth decay and subsequent need for oral surgeries.  She has becomes borderline agoraphobic  rarely leaving the home.  Stopped all  meds except xanax   1 mg  daily alternating with 1.5mg  daily,  tramadol  for management of back pain, and  metoprolol    Her anxiety dates back to  her early 20's,  has tried all the usual meds and nothing worked .  Used to use clonazepam  , now only xanax .  Has made appt with [facility]  for tele psychologic   and [facility] center to consider alternative therapies .her anxiety has been extremely bad despite using xanax   1 mg alternating with 1.5 mg nightly .  240 mg melatonin ,  50 mg benadryl, and 50 mg doxylamine.  She would like additional medication.   Now on  medicaid but has been unable to lose weight without use of zepbound . ,  wants to continue it if affordable . Currently taking zepbound  10 mg      Review of Symptoms  Patient denies headache, fevers, malaise, unintentional weight loss, skin rash, eye pain, sinus congestion and sinus pain, sore throat, dysphagia,  hemoptysis , cough, dyspnea, wheezing, chest pain, palpitations, orthopnea, edema, abdominal pain, nausea, melena, diarrhea, constipation, flank pain, dysuria, hematuria, urinary   Frequency, nocturia, numbness, tingling, seizures,  Focal weakness, Loss of consciousness,  Tremor, insomnia, depression, anxiety, and suicidal ideation.    Physical Exam:  BP 110/82   Pulse (!) 103   Ht 5' 7 (1.702 m)   Wt 181 lb 3.2 oz (82.2 kg)   SpO2 97%   BMI 28.38 kg/m    Physical Exam Vitals reviewed.  Constitutional:      General: She is not in acute distress.    Appearance: Normal appearance. She is normal weight. She is not ill-appearing, toxic-appearing or diaphoretic.  HENT:     Head: Normocephalic.  Eyes:     General: No scleral icterus.       Right eye: No discharge.        Left eye: No discharge.     Conjunctiva/sclera: Conjunctivae normal.  Cardiovascular:     Rate and Rhythm: Normal rate and regular rhythm.     Heart sounds: Normal heart sounds.  Pulmonary:     Effort: Pulmonary effort is normal. No respiratory distress.     Breath sounds: Normal breath sounds.  Musculoskeletal:        General: Normal range of motion.  Skin:    General: Skin is warm and dry.  Neurological:     General: No focal deficit present.     Mental Status: She is alert and oriented to person, place, and time. Mental status is at baseline.  Psychiatric:        Mood and Affect: Mood normal.        Behavior: Behavior normal.        Thought Content: Thought content normal.        Judgment: Judgment normal.     Assessment and Plan: S/P TAH (total abdominal hysterectomy)  Iron deficiency anemia, unspecified iron deficiency anemia type Assessment & Plan: Recurrent despite hysterectomy.   Likely due to gastric sleeve surgery ;   EGD and colonoscopy were donein 2019 for same and  other causes were ruled out  resuming  Supplementation advised Lab Results  Component Value Date   IRON 56 12/28/2023   TIBC 464.8 (H) 12/28/2023   FERRITIN 4.8 (L) 12/28/2023   Lab Results  Component Value Date   WBC 6.3 12/28/2023   HGB 12.2 12/28/2023   HCT 37.7 12/28/2023   MCV 82.1 12/28/2023    PLT 353.0 12/28/2023      Low back pain potentially associated with radiculopathy  Sciatica of left side associated with disorder of lumbosacral spine -     Tirzepatide -Weight Management; Inject 10 mg into the skin once a week.  Dispense: 2 mL; Refill: 2  Morbid obesity (HCC) -     Tirzepatide -Weight Management; Inject 10 mg into the skin once a week.  Dispense: 2 mL; Refill: 2  Need for influenza vaccination -     Flu vaccine trivalent PF, 6mos and older(Flulaval,Afluria,Fluarix,Fluzone)  B12 deficiency Assessment & Plan: Recurrent. Secomdary to  bariatric surgeryreviously managed with monthly injections.   4 weekly injections advisd,  then monthly Lab Results  Component Value Date   VITAMINB12 216 12/28/2023      Encounter for general adult medical examination with abnormal findings Assessment & Plan: age appropriate education and counseling updated, referrals for preventative services and immunizations addressed, dietary and smoking counseling addressed, most recent labs reviewed.  I have personally reviewed and have noted:   1) the patient's medical and social history 2) The pt's use of alcohol, tobacco, and illicit drugs 3) The patient's current medications and supplements 4) Functional ability including ADL's, fall risk, home safety risk, hearing and visual impairment 5) Diet and physical activities 6) Evidence for depression or mood disorder 7) The patient's height, weight, and BMI have been recorded in the chart   I have made referrals, and provided counseling and education based on review of the above    Generalized anxiety disorder Assessment & Plan:  Reviewed prior therapies :  she Did not tolerate buspirone  .  She is Tolerating lexapro  at 20 mg daily . Using #45 alprazolam  per month: 1 mg at bedtime, some days takes one during the day prn . The risks and benefits of benzodiazepine use were reviewed with patient today including excessive sedation leading to  respiratory depression,  impaired thinking/driving, and addiction.  Patient was advised to avoid concurrent use with alcohol, to use medication only as needed and not to share with others  . Refill history confirmed via [place] Controlled Substance databas, accessed by me today..   Moderate episode of recurrent major depressive disorder [facility]) Assessment & Plan: Unresolved despite a 3 year break for work.   Counselling referral s in progress.continue current medications    Cervical radiculopathy due to degenerative joint disease of spine Assessment & Plan: Managed with tramadol  .and tizanidine .   Refill history confirmed via [place] Controlled Substance databas, accessed by me today..    Other orders -     Metoprolol  Succinate ER; Take 1 tablet (100 mg total) by mouth daily. TAKE WITH OR IMMEDIATELY FOLLOWING A MEAL.  Dispense: 90 tablet; Refill: 3 -     traZODone  HCl; Take 1 tablet (100 mg total) by mouth at bedtime.  Dispense: 90 tablet; Refill: 1 -     Desloratadine ; TAKE 1 TABLET (5 MG TOTAL) BY MOUTH DAILY.  Dispense: 90 tablet; Refill: 3 -     Albuterol  Sulfate HFA; Inhale 2 puffs into the lungs every 4 (four) hours as needed for wheezing or shortness of breath.  Dispense: 6.7 g; Refill: 5 -     tiZANidine  HCl; Take 1 tablet (4 mg total) by mouth every 8 (eight) hours as needed for muscle spasms.  Dispense: 180 tablet; Refill: 1 -     Ergocalciferol ; Take 1 capsule (50,000 Units total) by mouth once a week.  Dispense: 12 capsule; Refill: 0 -     ALPRAZolam ; Take 1.5 tablets (1.5 mg total) by mouth at bedtime as needed for anxiety.  Dispense: 45 tablet; Refill: 2 -     traMADol  HCl; Take 2 tablets (100 mg total) by mouth every 8 (eight) hours as needed for moderate pain (pain score 4-6).  Dispense: 150 tablet; Refill: 2    Return in about 3 months (around 04/04/2024) for chronic pain management.  Tammy LITTIE Kettering, MD

## 2024-01-03 NOTE — Patient Instructions (Addendum)
 Add trazodone  100 mg at bedtime and stop doxylamine and possibly benadryl as well   I cannot increase the alprazolam  because of your concurrent use of tramadol ,  but I will refill at current dosing   B12:  weekly injections x 4 weeks,  THEN MONTHLY   FOLATE 1 MG DAILY   IRON ONCE DAILY WITH FOOD   VITAMIN D  REFILLED

## 2024-01-04 ENCOUNTER — Other Ambulatory Visit: Payer: Self-pay | Admitting: Internal Medicine

## 2024-01-04 ENCOUNTER — Other Ambulatory Visit (HOSPITAL_COMMUNITY): Payer: Self-pay

## 2024-01-04 ENCOUNTER — Telehealth: Payer: Self-pay

## 2024-01-04 NOTE — Telephone Encounter (Signed)
 Pharmacy Patient Advocate Encounter   Received notification from Onbase that prior authorization for Zepbound  10 mg/0.5 ml auto injectors is required/requested.   Insurance verification completed.   The patient is insured through [facility] MEDICAID.   Per test claim: Effective October 1st, Medicaid will discontinue coverage of GLP1 medications for weight loss (such as Wegovy  and Zepbound ), unless the patient has a documented history of a heart attack or stroke. Zepbound  will continue to be covered only for patients with moderate to severe sleep apnea (AHI 15-30) and a BMI greater than 40. Because of this change, the prior authorization team will not be submitting new PA requests for GLP1 medications prescribed for weight loss, as patients will be unable to continue therapy under Medicaid coverage.

## 2024-01-05 ENCOUNTER — Other Ambulatory Visit: Payer: Self-pay | Admitting: Internal Medicine

## 2024-01-05 DIAGNOSIS — M5387 Other specified dorsopathies, lumbosacral region: Secondary | ICD-10-CM

## 2024-01-05 NOTE — Assessment & Plan Note (Signed)
Managed with tramadol .and tizanidine.   Refill history confirmed via [place] Controlled Substance databas, accessed by me today.Marland Kitchen

## 2024-01-05 NOTE — Assessment & Plan Note (Signed)
 Unresolved despite a 3 year break for work.   Counselling referral s in progress.continue current medications

## 2024-01-05 NOTE — Assessment & Plan Note (Addendum)
 Reviewed prior therapies :  she Did not tolerate buspirone  .  She is Tolerating lexapro  at 20 mg daily . Using #45 alprazolam  per month: 1 mg at bedtime, some days takes one during the day prn . The risks and benefits of benzodiazepine use were reviewed with patient today including excessive sedation leading to respiratory depression,  impaired thinking/driving, and addiction.  Patient was advised to avoid concurrent use with alcohol, to use medication only as needed and not to share with others  . Refill history confirmed via  Controlled Substance databas, accessed by me today.SABRA

## 2024-01-05 NOTE — Assessment & Plan Note (Signed)
 Recurrent. Secomdary to  bariatric surgeryreviously managed with monthly injections.   4 weekly injections advisd,  then monthly Lab Results  Component Value Date   VITAMINB12 216 12/28/2023

## 2024-01-05 NOTE — Assessment & Plan Note (Signed)

## 2024-01-07 ENCOUNTER — Other Ambulatory Visit (HOSPITAL_COMMUNITY): Payer: Self-pay

## 2024-01-08 ENCOUNTER — Other Ambulatory Visit (HOSPITAL_COMMUNITY): Payer: Self-pay

## 2024-01-10 ENCOUNTER — Encounter: Payer: Self-pay | Admitting: Internal Medicine

## 2024-01-13 ENCOUNTER — Encounter: Payer: Self-pay | Admitting: Internal Medicine

## 2024-01-15 MED ORDER — CETIRIZINE HCL 10 MG PO TABS: 10.0000 mg | ORAL_TABLET | Freq: Every day | ORAL | 3 refills | Status: AC

## 2024-01-15 MED ORDER — AZELASTINE-FLUTICASONE 137-50 MCG/ACT NA SUSP: 1.0000 | Freq: Two times a day (BID) | NASAL | 3 refills | Status: AC

## 2024-01-18 ENCOUNTER — Ambulatory Visit
Admission: RE | Admit: 2024-01-18 | Discharge: 2024-01-18 | Disposition: A | Discharge status: Home / Self Care | Source: Ambulatory Visit | Attending: Internal Medicine | Admitting: Internal Medicine

## 2024-01-18 DIAGNOSIS — Z1231 Encounter for screening mammogram for malignant neoplasm of breast: Secondary | ICD-10-CM

## 2024-01-22 ENCOUNTER — Encounter: Payer: Self-pay | Admitting: Internal Medicine

## 2024-01-23 ENCOUNTER — Other Ambulatory Visit: Payer: Self-pay | Admitting: Internal Medicine

## 2024-01-23 DIAGNOSIS — R928 Other abnormal and inconclusive findings on diagnostic imaging of breast: Secondary | ICD-10-CM

## 2024-01-28 ENCOUNTER — Encounter: Admitting: Internal Medicine

## 2024-01-30 ENCOUNTER — Other Ambulatory Visit: Payer: Self-pay | Admitting: Internal Medicine

## 2024-02-06 ENCOUNTER — Other Ambulatory Visit

## 2024-02-06 ENCOUNTER — Encounter

## 2024-02-25 ENCOUNTER — Other Ambulatory Visit

## 2024-02-25 ENCOUNTER — Encounter

## 2024-02-25 DIAGNOSIS — F411 Generalized anxiety disorder: Secondary | ICD-10-CM | POA: Diagnosis not present

## 2024-02-25 DIAGNOSIS — F331 Major depressive disorder, recurrent, moderate: Secondary | ICD-10-CM | POA: Diagnosis not present

## 2024-03-01 ENCOUNTER — Encounter: Payer: Self-pay | Admitting: Internal Medicine

## 2024-03-03 DIAGNOSIS — F411 Generalized anxiety disorder: Secondary | ICD-10-CM | POA: Diagnosis not present

## 2024-03-03 DIAGNOSIS — F331 Major depressive disorder, recurrent, moderate: Secondary | ICD-10-CM | POA: Diagnosis not present

## 2024-03-05 ENCOUNTER — Other Ambulatory Visit

## 2024-03-05 ENCOUNTER — Encounter

## 2024-03-07 ENCOUNTER — Encounter: Payer: Self-pay | Admitting: Internal Medicine

## 2024-03-10 ENCOUNTER — Telehealth: Payer: Self-pay

## 2024-03-10 NOTE — Telephone Encounter (Signed)
 Copied from CRM #[id number]. Topic: Clinical - Medication Question >> Mar 10, 2024 12:26 PM Shereese L wrote: Reason for CRM: Patient is calling in to follow up with message sent on my chart. Patient is going out of town and needs medication ALPRAZolam  (XANAX ) 1 MG tablet filled a day early. She would like the script sent today so that she can have it for her trip

## 2024-03-11 NOTE — Telephone Encounter (Signed)
 Spoke with pharmacy and they stated pt was able to pick up the rx yesterday.

## 2024-04-03 ENCOUNTER — Encounter: Payer: Self-pay | Admitting: Internal Medicine

## 2024-04-03 ENCOUNTER — Telehealth: Admitting: Internal Medicine

## 2024-04-03 VITALS — Ht 67.0 in | Wt 185.2 lb

## 2024-04-03 DIAGNOSIS — F411 Generalized anxiety disorder: Secondary | ICD-10-CM | POA: Diagnosis not present

## 2024-04-03 DIAGNOSIS — F331 Major depressive disorder, recurrent, moderate: Secondary | ICD-10-CM

## 2024-04-03 DIAGNOSIS — E538 Deficiency of other specified B group vitamins: Secondary | ICD-10-CM

## 2024-04-03 DIAGNOSIS — E611 Iron deficiency: Secondary | ICD-10-CM | POA: Diagnosis not present

## 2024-04-03 DIAGNOSIS — M5387 Other specified dorsopathies, lumbosacral region: Secondary | ICD-10-CM | POA: Diagnosis not present

## 2024-04-03 MED ORDER — TRAMADOL HCL 50 MG PO TABS: 100.0000 mg | ORAL_TABLET | Freq: Three times a day (TID) | ORAL | 0 refills | Status: AC | PRN

## 2024-04-03 MED ORDER — ESCITALOPRAM OXALATE 5 MG PO TABS: 5.0000 mg | ORAL_TABLET | Freq: Every day | ORAL | 1 refills | Status: AC

## 2024-04-03 MED ORDER — ALPRAZOLAM 1 MG PO TABS: 1.5000 mg | ORAL_TABLET | Freq: Every evening | ORAL | 0 refills | Status: AC | PRN

## 2024-04-03 MED ORDER — TRAZODONE HCL 100 MG PO TABS: 100.0000 mg | ORAL_TABLET | Freq: Every day | ORAL | 1 refills | Status: AC

## 2024-04-03 MED ORDER — CYANOCOBALAMIN 1000 MCG/ML IJ SOLN: INTRAMUSCULAR | 0 refills | Status: AC

## 2024-04-03 MED ORDER — ERGOCALCIFEROL 1.25 MG (50000 UT) PO CAPS: 50000.0000 [IU] | ORAL_CAPSULE | ORAL | 0 refills | Status: AC

## 2024-04-03 MED ORDER — TIZANIDINE HCL 4 MG PO TABS: 4.0000 mg | ORAL_TABLET | Freq: Three times a day (TID) | ORAL | 1 refills | Status: AC | PRN

## 2024-04-03 MED ORDER — BUPROPION HCL ER (XL) 150 MG PO TB24: 150.0000 mg | ORAL_TABLET | Freq: Every day | ORAL | 1 refills | Status: AC

## 2024-04-03 NOTE — Progress Notes (Signed)
 Virtual Visit via Caregility   Note   This format is felt to be most appropriate for this patient at this time.  All issues noted in this document were discussed and addressed.  No physical exam was performed (except for noted visual exam findings with Video Visits).   I connected with  [name]  on 04/03/24 at  9:00 AM EST by a video enabled telemedicine application  and verified that I am speaking with the correct person using two identifiers. Location patient: home Location provider: work or home office Persons participating in the virtual visit: patient, provider  I discussed the limitations, risks, security and privacy concerns of performing an evaluation and management service by telephone and the availability of in person appointments. I also discussed with the patient that there may be a patient responsible charge related to this service. The patient expressed understanding and agreed to proceed.   Reason for visit: med refill,  management of GAD/depression/pain   HPI:  Tammy Christian is a 44 yr old female with a history of recurrent major depression complicated by GAD and insomnia , obesity with prior bariatric surgery, and chronic neck and back pain due to spinal stenosis , managed with tramadol   who presents for 3 month follow up.  She is currently unemployed due to her disabling back pain and depression .  She has  entered into a therapeutic relationship with a psychologist and is interested in a trial of wellbutrin  combined with lexapro      ROS: See pertinent positives and negatives per HPI.  Past Medical History:  Diagnosis Date   Abnormal Pap smear of cervix 2012   Abortion 2001   Allergy    Anxiety    panic disorder   Arthritis    due to spinal fusion in lumbar region   Asthma    very mild, mostly humid weather   Elevated liver enzymes 2023   Gastritis 2005   History of kidney stones    Hyperlipidemia    Follows with Dr.Hanzel Pizzo, PCP.   Migraines    Follows w/ PCP, Dr. Jannine.    Obesity    Panic disorder    Follows w/ PCP, Dr. Jannine.   Vitamin D  deficiency disease     Past Surgical History:  Procedure Laterality Date   BREAST BIOPSY Right 02/04/2001   Negative c Clip   CHOLECYSTECTOMY  04/08/2012   COLONOSCOPY WITH PROPOFOL  N/A 07/12/2017   Procedure: COLONOSCOPY WITH PROPOFOL ;  Surgeon: Unk Corinn Skiff, MD;  Location: [facility] ENDOSCOPY;  Service: Gastroenterology;  Laterality: N/A;   COLPOSCOPY  2012   ESOPHAGOGASTRODUODENOSCOPY (EGD) WITH PROPOFOL  N/A 07/12/2017   Procedure: ESOPHAGOGASTRODUODENOSCOPY (EGD) WITH PROPOFOL ;  Surgeon: Unk Corinn Skiff, MD;  Location: [facility] ENDOSCOPY;  Service: Gastroenterology;  Laterality: N/A;   KNEE ARTHROSCOPY Left 05/01/2016   Procedure: ARTHROSCOPY KNEE LATERAL RELEASE;  Surgeon: Ozell Flake, MD;  Location: [facility] ORS;  Service: Orthopedics;  Laterality: Left;   laprascopic sleeve  04/08/2012   GASTRIC   ROBOTIC ASSISTED LAPAROSCOPIC HYSTERECTOMY AND SALPINGECTOMY Bilateral 09/12/2022   Procedure: XI ROBOTIC ASSISTED LAPAROSCOPIC HYSTERECTOMY AND SALPINGECTOMY;  Surgeon: Gorge Ade, MD;  Location: [facility];  Service: Gynecology;  Laterality: Bilateral;  Requests 2 1/2 hrs.   SPINAL FUSION  09/25/2016    Family History  Problem Relation Age of Onset   Diabetes Father    Heart disease Father        atrial fib   Hyperlipidemia Father    Hypertension Father    Cancer  Father 30       colon polyps   Colon polyps Father    Cancer Maternal Grandmother        LUNG, THROAT   Macular degeneration Maternal Grandmother    Heart disease Paternal Grandmother    Alzheimer's disease Paternal Grandmother    Stroke Paternal Grandmother    Cancer Paternal Grandfather        COLON   Breast cancer Neg Hx     SOCIAL HX:  reports that she has never smoked. She has never used smokeless tobacco. She reports current alcohol use. She reports that she does not use drugs.   Current Medications[1]  EXAM:  VITALS  per patient if applicable:  GENERAL: alert, oriented, appears well and in no acute distress  HEENT: atraumatic, conjunttiva clear, no obvious abnormalities on inspection of external nose and ears  NECK: normal movements of the head and neck  LUNGS: on inspection no signs of respiratory distress, breathing rate appears normal, no obvious gross SOB, gasping or wheezing  CV: no obvious cyanosis  MS: moves all visible extremities without noticeable abnormality  PSYCH/NEURO: pleasant and cooperative, no obvious depression or anxiety, speech and thought processing grossly intact  ASSESSMENT AND PLAN: Moderate episode of recurrent major depressive disorder (HCC) Assessment & Plan: She has been resistant to pharmacotherapy for years due to past trials of SSRIs but is willing to initiate a trial of wellbutrin  coupled with lexapro  .  Starting dose of wellbutrin   XL 150 mg and lexapro  5 mg    Sciatica of left side associated with disorder of lumbosacral spine Assessment & Plan: Pain management has improved with weight loss and absence from work which  aggravated pain during procedural use of  lead apron .  She is using 5 tramadol  daily along with tizanidine  .Refill history confirmed via [place] Controlled Substance databas, accessed by me today.. last refill was Dec 22.  Controlled substance contract needed ;  30 day grace period given    Generalized anxiety disorder Assessment & Plan: I have limited her alprazolam  to #45 tablets monthly of the 1 mg dose,  which she is using at night to initiate sleep.  There will be no dose increases due to risk of dependence.  Adding lexapro  today    Dietary iron deficiency without anemia Assessment & Plan: Aggravated by bariatric surgery. Continue once daily supplementation    B12 nutritional deficiency Assessment & Plan: Continue supplementation   Folate deficiency Assessment & Plan: Continue supplementation    Other orders -     Cyanocobalamin ;  INJECT 1 ML (1,000 MCG TOTAL) INTO THE MUSCLE ONCE A WEEK.  Dispense: 4 mL; Refill: 0 -     tiZANidine  HCl; Take 1 tablet (4 mg total) by mouth every 8 (eight) hours as needed for muscle spasms.  Dispense: 180 tablet; Refill: 1 -     traZODone  HCl; Take 1 tablet (100 mg total) by mouth at bedtime.  Dispense: 90 tablet; Refill: 1 -     buPROPion  HCl ER (XL); Take 1 tablet (150 mg total) by mouth daily.  Dispense: 90 tablet; Refill: 1 -     Escitalopram  Oxalate; Take 1 tablet (5 mg total) by mouth daily.  Dispense: 90 tablet; Refill: 1 -     ALPRAZolam ; Take 1.5 tablets (1.5 mg total) by mouth at bedtime as needed for anxiety.  Dispense: 45 tablet; Refill: 0 -     traMADol  HCl; Take 2 tablets (100 mg total) by mouth every 8 (eight)  hours as needed for moderate pain (pain score 4-6).  Dispense: 150 tablet; Refill: 0 -     Ergocalciferol ; Take 1 capsule (50,000 Units total) by mouth once a week.  Dispense: 12 capsule; Refill: 0      I discussed the assessment and treatment plan with the patient. The patient was provided an opportunity to ask questions and all were answered. The patient agreed with the plan and demonstrated an understanding of the instructions.   The patient was advised to call back or seek an in-person evaluation if the symptoms worsen or if the condition fails to improve as anticipated.   I spent 30 minutes dedicated to the care of this patient on the date of this encounter to include pre-visit review of patient's medical history,  including recent ER visit, imaging studies and labs, face-to-face time with the patient , and post visit ordering of testing and therapeutics.    Verneita LITTIE Kettering, MD     [1]  Current Outpatient Medications:    albuterol  (VENTOLIN  HFA) 108 (90 Base) MCG/ACT inhaler, Inhale 2 puffs into the lungs every 4 (four) hours as needed for wheezing or shortness of breath., Disp: 6.7 g, Rfl: 5   Azelastine -Fluticasone  137-50 MCG/ACT SUSP, Place 1 spray into the  nose every 12 (twelve) hours., Disp: 23 g, Rfl: 3   buPROPion  (WELLBUTRIN  XL) 150 MG 24 hr tablet, Take 1 tablet (150 mg total) by mouth daily., Disp: 90 tablet, Rfl: 1   cetirizine  (ZYRTEC ) 10 MG tablet, Take 1 tablet (10 mg total) by mouth daily., Disp: 90 tablet, Rfl: 3   escitalopram  (LEXAPRO ) 5 MG tablet, Take 1 tablet (5 mg total) by mouth daily., Disp: 90 tablet, Rfl: 1   folic acid  (FOLVITE ) 1 MG tablet, Take 1 tablet (1 mg total) by mouth daily., Disp: 90 tablet, Rfl: 3   metoprolol  succinate (TOPROL -XL) 100 MG 24 hr tablet, Take 1 tablet (100 mg total) by mouth daily. TAKE WITH OR IMMEDIATELY FOLLOWING A MEAL., Disp: 90 tablet, Rfl: 3   montelukast  (SINGULAIR ) 10 MG tablet, Take 1 tablet (10 mg total) by mouth at bedtime., Disp: 90 tablet, Rfl: 3   Multiple Vitamin (MULTIVITAMIN WITH MINERALS) TABS tablet, Take 1 tablet by mouth daily., Disp: , Rfl:    omeprazole  (PRILOSEC) 40 MG capsule, Take 1 capsule (40 mg total) by mouth 2 (two) times daily before a meal., Disp: 180 capsule, Rfl: 3   ondansetron  (ZOFRAN ) 8 MG tablet, TAKE 1 TABLET BY MOUTH EVERY 8 HOURS AS NEEDED, Disp: 90 tablet, Rfl: 4   SUMAtriptan  (IMITREX ) 100 MG tablet, TAKE 1 TABLET BY MOUTH DAILY AS NEEDED FOR MIGRAINE., Disp: 9 tablet, Rfl: 11   tirzepatide  (ZEPBOUND ) 10 MG/0.5ML Pen, Inject 10 mg into the skin once a week., Disp: 2 mL, Rfl: 2   ALPRAZolam  (XANAX ) 1 MG tablet, Take 1.5 tablets (1.5 mg total) by mouth at bedtime as needed for anxiety., Disp: 45 tablet, Rfl: 0   cyanocobalamin  (VITAMIN B12) 1000 MCG/ML injection, INJECT 1 ML (1,000 MCG TOTAL) INTO THE MUSCLE ONCE A WEEK., Disp: 4 mL, Rfl: 0   ergocalciferol  (DRISDOL ) 1.25 MG (50000 UT) capsule, Take 1 capsule (50,000 Units total) by mouth once a week., Disp: 12 capsule, Rfl: 0   tiZANidine  (ZANAFLEX ) 4 MG tablet, Take 1 tablet (4 mg total) by mouth every 8 (eight) hours as needed for muscle spasms., Disp: 180 tablet, Rfl: 1   traMADol  (ULTRAM ) 50 MG tablet, Take  2 tablets (100 mg total) by  mouth every 8 (eight) hours as needed for moderate pain (pain score 4-6)., Disp: 150 tablet, Rfl: 0   traZODone  (DESYREL ) 100 MG tablet, Take 1 tablet (100 mg total) by mouth at bedtime., Disp: 90 tablet, Rfl: 1

## 2024-04-03 NOTE — Patient Instructions (Addendum)
 I can no longer prescribe controlled substances  to patients without  a controlled substance contract which must be renewed annually.  Please come by the office ASAP to sign this form. It will be waiting for you at the front desk.  A 30 day grace period has been given on your refills to give you time to do this without running out of your medication.  Starting dose of wellbutrin  150 mg once daily IN THE MORNING.  YOU MAY TAKE THE LEXAPRO  WITH IT,  WITH FOOD  THE LEXAPRO  DOSE CAN BE INCREASED TO 10 MG AFTER 2 WEEKS   VITAMIN D  REFILLED AS WELL   WE WILL REPEAT LABS IN APRIL

## 2024-04-03 NOTE — Assessment & Plan Note (Signed)
 I have limited her alprazolam  to #45 tablets monthly of the 1 mg dose,  which she is using at night to initiate sleep.  There will be no dose increases due to risk of dependence.  Adding lexapro  today

## 2024-04-03 NOTE — Assessment & Plan Note (Signed)
 Continue supplementation  ?

## 2024-04-03 NOTE — Assessment & Plan Note (Signed)
 Pain management has improved with weight loss and absence from work which  aggravated pain during procedural use of  lead apron .  She is using 5 tramadol  daily along with tizanidine  .Refill history confirmed via [place] Controlled Substance databas, accessed by me today.. last refill was Dec 22.  Controlled substance contract needed ;  30 day grace period given

## 2024-04-03 NOTE — Assessment & Plan Note (Signed)
 Aggravated by bariatric surgery. Continue once daily supplementation

## 2024-04-03 NOTE — Assessment & Plan Note (Addendum)
 She has been resistant to pharmacotherapy for years due to past trials of SSRIs but is willing to initiate a trial of wellbutrin  coupled with lexapro  .  Starting dose of wellbutrin   XL 150 mg and lexapro  5 mg

## 2024-04-08 ENCOUNTER — Telehealth: Payer: Self-pay | Admitting: Internal Medicine

## 2024-04-08 ENCOUNTER — Ambulatory Visit
Admission: RE | Admit: 2024-04-08 | Discharge: 2024-04-08 | Disposition: A | Source: Ambulatory Visit | Attending: Internal Medicine | Admitting: Internal Medicine

## 2024-04-08 ENCOUNTER — Ambulatory Visit
Admission: RE | Admit: 2024-04-08 | Discharge: 2024-04-08 | Disposition: A | Discharge status: Home / Self Care | Attending: Internal Medicine | Admitting: Internal Medicine

## 2024-04-08 DIAGNOSIS — G8929 Other chronic pain: Secondary | ICD-10-CM | POA: Diagnosis present

## 2024-04-08 DIAGNOSIS — M25511 Pain in right shoulder: Secondary | ICD-10-CM | POA: Diagnosis present

## 2024-04-08 NOTE — Telephone Encounter (Signed)
 Noted and medication was refilled on 04/03/2024.

## 2024-04-08 NOTE — Telephone Encounter (Signed)
 Non- opioid substance agreement has been scanned into the chart under release of information.

## 2024-04-13 ENCOUNTER — Encounter: Payer: Self-pay | Admitting: Internal Medicine

## 2024-04-14 ENCOUNTER — Ambulatory Visit: Payer: Self-pay | Admitting: Internal Medicine

## 2024-04-14 ENCOUNTER — Other Ambulatory Visit

## 2024-04-14 ENCOUNTER — Encounter

## 2024-04-14 ENCOUNTER — Other Ambulatory Visit: Payer: Self-pay | Admitting: Internal Medicine

## 2024-04-14 DIAGNOSIS — M25611 Stiffness of right shoulder, not elsewhere classified: Secondary | ICD-10-CM

## 2024-05-20 ENCOUNTER — Ambulatory Visit

## 2024-05-22 ENCOUNTER — Encounter

## 2024-05-22 ENCOUNTER — Other Ambulatory Visit

## 2024-05-27 ENCOUNTER — Ambulatory Visit

## 2024-05-29 ENCOUNTER — Ambulatory Visit

## 2024-06-03 ENCOUNTER — Ambulatory Visit

## 2024-06-05 ENCOUNTER — Ambulatory Visit

## 2024-06-10 ENCOUNTER — Ambulatory Visit

## 2024-06-12 ENCOUNTER — Ambulatory Visit

## 2024-06-17 ENCOUNTER — Ambulatory Visit

## 2024-06-19 ENCOUNTER — Ambulatory Visit

## 2024-06-24 ENCOUNTER — Ambulatory Visit

## 2024-06-26 ENCOUNTER — Ambulatory Visit

## 2024-06-30 ENCOUNTER — Ambulatory Visit: Admitting: Internal Medicine

## 2024-07-01 ENCOUNTER — Ambulatory Visit

## 2024-07-03 ENCOUNTER — Ambulatory Visit

## 2024-07-08 ENCOUNTER — Ambulatory Visit

## 2024-07-10 ENCOUNTER — Ambulatory Visit

## 2024-07-15 ENCOUNTER — Ambulatory Visit

## 2024-07-17 ENCOUNTER — Ambulatory Visit

## 2024-07-22 ENCOUNTER — Ambulatory Visit

## 2024-07-24 ENCOUNTER — Ambulatory Visit

## 2024-07-29 ENCOUNTER — Ambulatory Visit

## 2024-07-31 ENCOUNTER — Ambulatory Visit
